# Patient Record
Sex: Female | Born: 1975
Health system: Southern US, Community
[De-identification: ages and names within clinical notes are randomized; demographics above are authoritative.]

## PROBLEM LIST (undated history)

## (undated) DIAGNOSIS — R112 Nausea with vomiting, unspecified: Secondary | ICD-10-CM

## (undated) DIAGNOSIS — R0600 Dyspnea, unspecified: Secondary | ICD-10-CM

## (undated) DIAGNOSIS — S060XAA Concussion with loss of consciousness status unknown, initial encounter: Secondary | ICD-10-CM

## (undated) DIAGNOSIS — F419 Anxiety disorder, unspecified: Secondary | ICD-10-CM

## (undated) DIAGNOSIS — Z9889 Other specified postprocedural states: Secondary | ICD-10-CM

## (undated) DIAGNOSIS — G43909 Migraine, unspecified, not intractable, without status migrainosus: Secondary | ICD-10-CM

## (undated) DIAGNOSIS — J302 Other seasonal allergic rhinitis: Secondary | ICD-10-CM

## (undated) HISTORY — DX: Migraine, unspecified, not intractable, without status migrainosus: G43.909

## (undated) HISTORY — PX: EYELID LACERATION REPAIR: SHX5333

## (undated) HISTORY — DX: Nausea with vomiting, unspecified: R11.2

## (undated) HISTORY — PX: EYE SURGERY: SHX253

## (undated) HISTORY — DX: Concussion with loss of consciousness status unknown, initial encounter: S06.0XAA

## (undated) HISTORY — DX: Other seasonal allergic rhinitis: J30.2

---

## 1999-06-01 ENCOUNTER — Other Ambulatory Visit: Admission: RE | Admit: 1999-06-01 | Discharge: 1999-06-01 | Payer: Self-pay | Admitting: Obstetrics and Gynecology

## 2001-07-26 ENCOUNTER — Emergency Department (HOSPITAL_COMMUNITY): Admission: EM | Admit: 2001-07-26 | Discharge: 2001-07-26 | Payer: Self-pay | Admitting: Emergency Medicine

## 2001-07-26 ENCOUNTER — Encounter: Payer: Self-pay | Admitting: Emergency Medicine

## 2001-07-30 ENCOUNTER — Emergency Department (HOSPITAL_COMMUNITY): Admission: EM | Admit: 2001-07-30 | Discharge: 2001-07-30 | Payer: Self-pay | Admitting: Emergency Medicine

## 2001-07-30 ENCOUNTER — Encounter: Payer: Self-pay | Admitting: Emergency Medicine

## 2002-02-27 ENCOUNTER — Ambulatory Visit (HOSPITAL_COMMUNITY): Admission: RE | Admit: 2002-02-27 | Discharge: 2002-02-27 | Payer: Self-pay | Admitting: Chiropractic Medicine

## 2002-02-27 ENCOUNTER — Encounter: Payer: Self-pay | Admitting: Chiropractic Medicine

## 2019-03-25 ENCOUNTER — Emergency Department (HOSPITAL_COMMUNITY)
Admission: EM | Admit: 2019-03-25 | Discharge: 2019-03-25 | Disposition: A | Discharge status: Home / Self Care | Payer: No Typology Code available for payment source | Attending: Emergency Medicine | Admitting: Emergency Medicine

## 2019-03-25 ENCOUNTER — Emergency Department (HOSPITAL_COMMUNITY): Payer: No Typology Code available for payment source

## 2019-03-25 ENCOUNTER — Emergency Department (HOSPITAL_COMMUNITY): Admission: EM | Admit: 2019-03-25 | Discharge: 2019-03-25 | Discharge status: Absent without leave | Payer: Self-pay

## 2019-03-25 ENCOUNTER — Encounter (HOSPITAL_COMMUNITY): Payer: Self-pay | Admitting: Emergency Medicine

## 2019-03-25 ENCOUNTER — Ambulatory Visit (HOSPITAL_COMMUNITY)
Admission: EM | Admit: 2019-03-25 | Discharge: 2019-03-25 | Disposition: A | Discharge status: Other Acute Inpatient Hospital | Payer: Self-pay

## 2019-03-25 ENCOUNTER — Encounter (HOSPITAL_COMMUNITY): Payer: Self-pay

## 2019-03-25 ENCOUNTER — Ambulatory Visit (HOSPITAL_COMMUNITY)
Admission: EM | Admit: 2019-03-25 | Discharge: 2019-03-25 | Disposition: A | Discharge status: Home / Self Care | Payer: Self-pay

## 2019-03-25 ENCOUNTER — Other Ambulatory Visit: Payer: Self-pay

## 2019-03-25 DIAGNOSIS — Y998 Other external cause status: Secondary | ICD-10-CM | POA: Insufficient documentation

## 2019-03-25 DIAGNOSIS — Y93I9 Activity, other involving external motion: Secondary | ICD-10-CM | POA: Insufficient documentation

## 2019-03-25 DIAGNOSIS — M7918 Myalgia, other site: Secondary | ICD-10-CM

## 2019-03-25 DIAGNOSIS — S299XXA Unspecified injury of thorax, initial encounter: Secondary | ICD-10-CM | POA: Diagnosis not present

## 2019-03-25 DIAGNOSIS — S199XXA Unspecified injury of neck, initial encounter: Secondary | ICD-10-CM | POA: Diagnosis present

## 2019-03-25 DIAGNOSIS — S161XXA Strain of muscle, fascia and tendon at neck level, initial encounter: Secondary | ICD-10-CM | POA: Insufficient documentation

## 2019-03-25 DIAGNOSIS — R51 Headache: Secondary | ICD-10-CM | POA: Insufficient documentation

## 2019-03-25 DIAGNOSIS — Y92410 Unspecified street and highway as the place of occurrence of the external cause: Secondary | ICD-10-CM | POA: Insufficient documentation

## 2019-03-25 DIAGNOSIS — F17210 Nicotine dependence, cigarettes, uncomplicated: Secondary | ICD-10-CM | POA: Insufficient documentation

## 2019-03-25 MED ORDER — ONDANSETRON 4 MG PO TBDP
4.0000 mg | ORAL_TABLET | Freq: Once | ORAL | Status: AC
  Administered 2019-03-25: 4 mg via ORAL
  Filled 2019-03-25: qty 1

## 2019-03-25 MED ORDER — METHOCARBAMOL 500 MG PO TABS: 500.0000 mg | ORAL_TABLET | Freq: Two times a day (BID) | ORAL | 0 refills | Status: DC

## 2019-03-25 MED ORDER — ONDANSETRON 4 MG PO TBDP: 4.0000 mg | ORAL_TABLET | Freq: Three times a day (TID) | ORAL | 0 refills | Status: DC | PRN

## 2019-03-25 MED ORDER — HYDROCODONE-ACETAMINOPHEN 5-325 MG PO TABS
2.0000 | ORAL_TABLET | Freq: Once | ORAL | Status: AC
  Administered 2019-03-25: 2 via ORAL
  Filled 2019-03-25: qty 2

## 2019-03-25 NOTE — Discharge Instructions (Signed)

## 2019-03-25 NOTE — ED Notes (Signed)
Patient is being discharged from the [facility] and sent to the Emergency Department via wheelchair by staff. Per KT, patient is stable but in need of higher level of care due to eval of possible head injury. Patient is aware and verbalizes understanding of plan of care.  Vitals:   03/25/19 1904  BP: 116/90  Pulse: (!) 56  Resp: 19  Temp: 98.5 F (36.9 C)  SpO2: 100%
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
## 2019-03-25 NOTE — ED Notes (Signed)
Discharge instructions discussed with pt. Pt verbalized understanding no questions at this time. Pt to go home with husband

## 2019-03-25 NOTE — ED Notes (Signed)
Patient transported to CT 
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
## 2019-03-29 ENCOUNTER — Ambulatory Visit (HOSPITAL_COMMUNITY)
Admission: EM | Admit: 2019-03-29 | Discharge: 2019-03-29 | Disposition: A | Discharge status: Home / Self Care | Payer: Self-pay | Attending: Emergency Medicine | Admitting: Emergency Medicine

## 2019-03-29 ENCOUNTER — Ambulatory Visit (INDEPENDENT_AMBULATORY_CARE_PROVIDER_SITE_OTHER): Payer: Self-pay

## 2019-03-29 ENCOUNTER — Encounter (HOSPITAL_COMMUNITY): Payer: Self-pay

## 2019-03-29 ENCOUNTER — Other Ambulatory Visit: Payer: Self-pay

## 2019-03-29 DIAGNOSIS — M7918 Myalgia, other site: Secondary | ICD-10-CM
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
## 2019-03-29 NOTE — Discharge Instructions (Addendum)
Take ibuprofen as needed for your discomfort.    The x-ray of your foot was negative.    Go to the emergency department if you develop acute worsening pain, dizziness, chest pain, shortness of breath, palpitations, abdominal pain, blood in your stool, or other concerning symptoms.

## 2019-04-12 ENCOUNTER — Emergency Department (HOSPITAL_COMMUNITY): Payer: No Typology Code available for payment source

## 2019-04-12 ENCOUNTER — Emergency Department (HOSPITAL_COMMUNITY)
Admission: EM | Admit: 2019-04-12 | Discharge: 2019-04-12 | Disposition: A | Discharge status: Home / Self Care | Payer: No Typology Code available for payment source | Attending: Emergency Medicine | Admitting: Emergency Medicine

## 2019-04-12 DIAGNOSIS — R29810 Facial weakness: Secondary | ICD-10-CM | POA: Diagnosis present

## 2019-04-12 DIAGNOSIS — M542 Cervicalgia: Secondary | ICD-10-CM | POA: Diagnosis not present

## 2019-04-12 DIAGNOSIS — B373 Candidiasis of vulva and vagina: Secondary | ICD-10-CM | POA: Diagnosis not present

## 2019-04-12 DIAGNOSIS — N898 Other specified noninflammatory disorders of vagina: Secondary | ICD-10-CM | POA: Diagnosis not present

## 2019-04-12 DIAGNOSIS — F17290 Nicotine dependence, other tobacco product, uncomplicated: Secondary | ICD-10-CM | POA: Diagnosis not present

## 2019-04-12 DIAGNOSIS — B3731 Acute candidiasis of vulva and vagina: Secondary | ICD-10-CM

## 2019-04-12 DIAGNOSIS — Z79899 Other long term (current) drug therapy: Secondary | ICD-10-CM | POA: Diagnosis not present

## 2019-04-12 LAB — I-STAT CHEM 8, ED
BUN: 12 mg/dL (ref 6–20)
Calcium, Ion: 1.23 mmol/L (ref 1.15–1.40)
Chloride: 105 mmol/L (ref 98–111)
Creatinine, Ser: 0.7 mg/dL (ref 0.44–1.00)
Glucose, Bld: 85 mg/dL (ref 70–99)
HCT: 43 % (ref 36.0–46.0)
Hemoglobin: 14.6 g/dL (ref 12.0–15.0)
Potassium: 3.5 mmol/L (ref 3.5–5.1)
Sodium: 142 mmol/L (ref 135–145)
TCO2: 23 mmol/L (ref 22–32)

## 2019-04-12 LAB — I-STAT BETA HCG BLOOD, ED (MC, WL, AP ONLY): I-stat hCG, quantitative: <5 m[IU]/mL (ref <5)

## 2019-04-12 LAB — URINALYSIS, ROUTINE W REFLEX MICROSCOPIC
Bilirubin Urine: NEGATIVE
Glucose, UA: NEGATIVE mg/dL
Ketones, ur: NEGATIVE mg/dL
Nitrite: NEGATIVE
Protein, ur: NEGATIVE mg/dL
Specific Gravity, Urine: 1.038 — ABNORMAL HIGH (ref 1.005–1.030)
pH: 5 (ref 5.0–8.0)

## 2019-04-12 LAB — CBG MONITORING, ED: Glucose-Capillary: 95 mg/dL (ref 70–99)

## 2019-04-12 MED ORDER — IOHEXOL 350 MG/ML SOLN
100.0000 mL | Freq: Once | INTRAVENOUS | Status: AC | PRN
  Administered 2019-04-12: 100 mL via INTRAVENOUS

## 2019-04-12 MED ORDER — ONDANSETRON 4 MG PO TBDP: 4.0000 mg | ORAL_TABLET | Freq: Three times a day (TID) | ORAL | 0 refills | Status: DC | PRN

## 2019-04-12 MED ORDER — FLUCONAZOLE 200 MG PO TABS: 200.0000 mg | ORAL_TABLET | Freq: Once | ORAL | 0 refills | Status: AC

## 2019-04-12 MED ORDER — METHOCARBAMOL 500 MG PO TABS: 500.0000 mg | ORAL_TABLET | Freq: Two times a day (BID) | ORAL | 0 refills | Status: DC

## 2019-04-12 MED ORDER — PREDNISONE 10 MG PO TABS: 10.0000 mg | ORAL_TABLET | Freq: Every day | ORAL | 0 refills | Status: DC

## 2019-04-12 MED ORDER — PREDNISONE 10 MG PO TABS: 40.0000 mg | ORAL_TABLET | Freq: Every day | ORAL | 0 refills | Status: AC

## 2019-04-12 MED ORDER — PREDNISONE 10 MG PO TABS: 40.0000 mg | ORAL_TABLET | Freq: Every day | ORAL | 0 refills | Status: DC
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
## 2019-04-12 NOTE — ED Notes (Signed)
Patient transported to CT 
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
## 2019-04-12 NOTE — Discharge Instructions (Addendum)
Take ibuprofen as needed for pain as discussed  Follow up with neurology if needed Return if symptoms worsen  Take fluconazole as directed

## 2019-06-04 ENCOUNTER — Telehealth: Payer: Self-pay | Admitting: Neurology

## 2019-06-04 ENCOUNTER — Encounter: Payer: Self-pay | Admitting: Neurology

## 2019-06-04 ENCOUNTER — Ambulatory Visit: Payer: No Typology Code available for payment source | Admitting: Neurology

## 2019-06-04 ENCOUNTER — Other Ambulatory Visit: Payer: Self-pay

## 2019-06-04 VITALS — BP 118/80 | HR 100 | Temp 97.7°F | Ht 66.0 in | Wt 150.0 lb

## 2019-06-04 DIAGNOSIS — F0781 Postconcussional syndrome: Secondary | ICD-10-CM

## 2019-06-04 DIAGNOSIS — M545 Low back pain, unspecified: Secondary | ICD-10-CM

## 2019-06-04 DIAGNOSIS — IMO0002 Reserved for concepts with insufficient information to code with codable children: Secondary | ICD-10-CM

## 2019-06-04 DIAGNOSIS — S0990XA Unspecified injury of head, initial encounter: Secondary | ICD-10-CM | POA: Insufficient documentation

## 2019-06-04 DIAGNOSIS — G43709 Chronic migraine without aura, not intractable, without status migrainosus: Secondary | ICD-10-CM

## 2019-06-04 DIAGNOSIS — M549 Dorsalgia, unspecified: Secondary | ICD-10-CM | POA: Insufficient documentation

## 2019-06-04 MED ORDER — GABAPENTIN 300 MG PO CAPS: 300.0000 mg | ORAL_CAPSULE | Freq: Three times a day (TID) | ORAL | 11 refills | Status: DC

## 2019-06-04 MED ORDER — RIZATRIPTAN BENZOATE 10 MG PO TABS: 10.0000 mg | ORAL_TABLET | ORAL | 3 refills | Status: DC | PRN

## 2019-06-04 NOTE — Telephone Encounter (Signed)
self pay order sent to GI. They will reach out to the patient to schedule.  °
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
## 2019-06-05 ENCOUNTER — Telehealth: Payer: Self-pay | Admitting: Neurology

## 2019-06-05 NOTE — Telephone Encounter (Signed)
In response to a voicemail that pt left on office voicemail @ 12:16.  Pt has asked for a call to confirm if [id number], if so pt is asking how many, she would like a call to confirm the above mentioned pharmacy is where the script was called into.

## 2019-06-05 NOTE — Telephone Encounter (Signed)
Called pt. [facility] but states they did not have any on file. I called and verified they has rx's but showing under "Lindsey Graham". Pt needs to bring updated insurance cards with her and they will update her info. I called pt and relayed this. She verbalized understanding.

## 2019-06-10 ENCOUNTER — Telehealth: Payer: Self-pay | Admitting: Neurology

## 2019-06-10 NOTE — Telephone Encounter (Signed)
Called, LVM returning pt call 

## 2019-06-10 NOTE — Telephone Encounter (Signed)
Pt called in and stated the Emgality sample helped her, she also states she is dealing with anxiety and she wants to know what she can do about it.

## 2019-06-11 NOTE — Telephone Encounter (Signed)
Called, LVM for pt to call office. 

## 2019-06-12 NOTE — Telephone Encounter (Signed)
I spoke to Dr. Felecia Shelling concerning this patient.   Per vo by Dr. Felecia Shelling, he will offer the following prescriptions:  1) For migraines:  Emgality 120mg , one injection monthly, 90-day x 3 okay.  2) For anxiety:  Sertraline 50mg , one tablet daily, 90-day x 3 okay.  I attempted to reach the patient again.  Left another message requesting she call our office back.  Provided our hours of operation and phone number.

## 2019-06-16 NOTE — Telephone Encounter (Signed)
Pt returning call please call back °

## 2019-06-16 NOTE — Telephone Encounter (Signed)
Called, LVM for pt returning call.

## 2019-06-24 ENCOUNTER — Telehealth: Payer: Self-pay | Admitting: Neurology

## 2019-06-24 MED ORDER — SERTRALINE HCL 50 MG PO TABS: 50.0000 mg | ORAL_TABLET | Freq: Every day | ORAL | 3 refills | Status: DC

## 2019-06-24 NOTE — Telephone Encounter (Deleted)
Pt has called to discuss [name].  Pt is asking if a prescription will be called in for it and when she will need to take it again.  Please call

## 2019-06-24 NOTE — Telephone Encounter (Signed)
Pt has called to discuss [name].  Pt is asking if a prescription will be called in for it and when she will need to take it again.  Please call °

## 2019-06-24 NOTE — Telephone Encounter (Addendum)
Called pt back. Relayed Dr. Garth Bigness recommendations. She does not have insurance right now. I explained she can contact [employer] to see if she would be eligible to receive emgality from them. I provided phone number: [phone]. Also sent her link to website to start application online via mychart. Pt will let me know once she completes this so we can then send in prescriber portion.  [facility] She will try and use goodrx coupon for this.

## 2019-06-24 NOTE — Addendum Note (Signed)
Addended by: Hope Pigeon on: 06/24/2019 09:51 AM   Modules accepted: Orders

## 2019-07-01 ENCOUNTER — Other Ambulatory Visit: Payer: Self-pay

## 2019-07-01 ENCOUNTER — Ambulatory Visit
Admission: RE | Admit: 2019-07-01 | Discharge: 2019-07-01 | Disposition: A | Discharge status: Home / Self Care | Payer: Self-pay | Source: Ambulatory Visit | Attending: Neurology | Admitting: Neurology

## 2019-07-01 DIAGNOSIS — S0990XA Unspecified injury of head, initial encounter: Secondary | ICD-10-CM

## 2019-07-03 ENCOUNTER — Telehealth: Payer: Self-pay

## 2019-07-03 ENCOUNTER — Telehealth: Payer: Self-pay | Admitting: Neurology

## 2019-07-03 MED ORDER — EMGALITY 120 MG/ML ~~LOC~~ SOSY: 120.0000 mg | PREFILLED_SYRINGE | Freq: Every day | SUBCUTANEOUS | 5 refills | Status: DC

## 2019-07-03 NOTE — Telephone Encounter (Signed)
-----   Message from Britt Bottom, MD sent at 07/02/2019 12:28 PM EST ----- Please let the patient know that the MRi was normal

## 2019-07-03 NOTE — Telephone Encounter (Signed)
Pt did not qualify for pt assistance. Pt requests rx [facility] [place] and she will pay out of pocket.

## 2019-07-03 NOTE — Telephone Encounter (Signed)
I contacted the pt and lvm advising of normal results ( ok per dpr). Pt was advised to call back if she had any further questions.

## 2019-07-03 NOTE — Telephone Encounter (Signed)
1) Medication(s) Requested (by name): emigility 2) Pharmacy [facility] [address] [facility] [place] 3) Special Requests: Patient called stating she would need a refill for emigility and was not eligible for it. Please follow up.

## 2019-07-16 ENCOUNTER — Encounter (HOSPITAL_COMMUNITY): Payer: Self-pay

## 2019-07-16 ENCOUNTER — Ambulatory Visit (HOSPITAL_COMMUNITY)
Admission: EM | Admit: 2019-07-16 | Discharge: 2019-07-16 | Disposition: A | Discharge status: Home / Self Care | Payer: Self-pay | Attending: Family Medicine | Admitting: Family Medicine

## 2019-07-16 ENCOUNTER — Ambulatory Visit: Payer: Self-pay | Admitting: Family Medicine

## 2019-07-16 ENCOUNTER — Other Ambulatory Visit: Payer: Self-pay

## 2019-07-16 ENCOUNTER — Encounter: Payer: Self-pay | Admitting: Family Medicine

## 2019-07-16 VITALS — BP 116/80 | HR 92 | Temp 97.8°F | Ht 66.0 in | Wt 157.4 lb

## 2019-07-16 DIAGNOSIS — F0781 Postconcussional syndrome: Secondary | ICD-10-CM

## 2019-07-16 DIAGNOSIS — F329 Major depressive disorder, single episode, unspecified: Secondary | ICD-10-CM

## 2019-07-16 DIAGNOSIS — IMO0002 Reserved for concepts with insufficient information to code with codable children: Secondary | ICD-10-CM

## 2019-07-16 DIAGNOSIS — Z7689 Persons encountering health services in other specified circumstances: Secondary | ICD-10-CM

## 2019-07-16 DIAGNOSIS — G8929 Other chronic pain: Secondary | ICD-10-CM

## 2019-07-16 DIAGNOSIS — G43709 Chronic migraine without aura, not intractable, without status migrainosus: Secondary | ICD-10-CM

## 2019-07-16 DIAGNOSIS — R319 Hematuria, unspecified: Secondary | ICD-10-CM | POA: Insufficient documentation

## 2019-07-16 DIAGNOSIS — F419 Anxiety disorder, unspecified: Secondary | ICD-10-CM

## 2019-07-16 LAB — POCT URINALYSIS DIP (DEVICE)
Bilirubin Urine: NEGATIVE
Glucose, UA: NEGATIVE mg/dL
Ketones, ur: NEGATIVE mg/dL
Nitrite: NEGATIVE
Protein, ur: NEGATIVE mg/dL
Specific Gravity, Urine: 1.025 (ref 1.005–1.030)
Urobilinogen, UA: 0.2 mg/dL (ref 0.0–1.0)
pH: 7 (ref 5.0–8.0)

## 2019-07-16 MED ORDER — FLUCONAZOLE 150 MG PO TABS: 150.0000 mg | ORAL_TABLET | Freq: Every day | ORAL | 0 refills | Status: DC

## 2019-07-16 MED ORDER — NITROFURANTOIN MONOHYD MACRO 100 MG PO CAPS: 100.0000 mg | ORAL_CAPSULE | Freq: Two times a day (BID) | ORAL | 0 refills | Status: DC

## 2019-07-16 NOTE — Progress Notes (Signed)
I have read the note, and I agree with the clinical assessment and plan.  Emily Forse A. Rizwan Kuyper, MD, PhD, FAAN Certified in Neurology, Clinical Neurophysiology, Sleep Medicine, Pain Medicine and Neuroimaging  [facility] [address], [address] , [place] [address] [phone]  
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
## 2019-07-16 NOTE — ED Triage Notes (Signed)
Pt states she has blood in her urine. Pt states this has been going on for a week now.

## 2019-07-16 NOTE — Patient Instructions (Addendum)
We will continue Emgality, gabapentin and Zoloft as prescribed.   You can use rizatriptan as needed for abortive therapy of migraines  Referral for PCP placed   Follow up in 6 months, sooner if needed    Post-Concussion Syndrome  Post-concussion syndrome is when symptoms last longer than normal after a head injury. What are the signs or symptoms? After a head injury, you may:  Have headaches.  Feel tired.  Feel dizzy.  Feel weak.  Have trouble seeing.  Have trouble in bright lights.  Have trouble hearing.  Not be able to remember things.  Not be able to focus.  Have trouble sleeping.  Have mood swings.  Have trouble learning new things. These can last from weeks to months. Follow these instructions at home: Medicines  Take all medicines only as told by your doctor.  Do not take prescription pain medicines. Activity  Limit activities as told by your doctor. This includes: ? Homework. ? Job-related work. ? Thinking. ? Watching TV. ? Using a computer or phone. ? Puzzles. ? Exercise. ? Sports.  Slowly return to your normal activity as told by your doctor.  Stop an activity if you have symptoms.  Do not do anything that may cause you to get injured again. General instructions  Rest. Try to: ? Sleep 7-9 hours each night. ? Take naps or breaks when you feel tired during the day.  Do not drink alcohol until your doctor says that you can.  Keep track of your symptoms.  Keep all follow-up visits as told by your doctor. This is important. Contact a doctor if:  You do not improve.  You get worse.  You have another injury. Get help right away if:  You have a very bad headache.  You feel confused.  You feel very sleepy.  You pass out (faint).  You throw up (vomit).  You feel weak in any part of your body.  You feel numb in any part of your body.  You start shaking (have a seizure).  You have trouble  talking. Summary  Post-concussion syndrome is when symptoms last longer than normal after a head injury.  Limit all activity after your injury. Gradually return to normal activity as told by your doctor.  Rest, do not drink alcohol, and avoid prescription pain medicines after a concussion.  Call your doctor if your symptoms get worse. This information is not intended to replace advice given to you by your health care provider. Make sure you discuss any questions you have with your health care provider. Document Released: 09/07/2004 Document Revised: 05/23/2018 Document Reviewed: 09/04/2017 Elsevier Patient Education  2020 [employer].     Memory Compensation Strategies  1. Use "WARM" strategy.  W= write it down  A= associate it  R= repeat it  M= make a mental note  2.   You can keep a Social worker.  Use a 3-ring notebook with sections for the following: calendar, important names and phone numbers,  medications, doctors' names/phone numbers, lists/reminders, and a section to journal what you did  each day.   3.    Use a calendar to write appointments down.  4.    Write yourself a schedule for the day.  This can be placed on the calendar or in a separate section of the Memory Notebook.  Keeping a  regular schedule can help memory.  5.    Use medication organizer with sections for each day or morning/evening pills.  You may need help  loading it  6.    Keep a basket, or pegboard by the door.  Place items that you need to take out with you in the basket or on the pegboard.  You may also want to  include a message board for reminders.  7.    Use sticky notes.  Place sticky notes with reminders in a place where the task is performed.  For example: " turn off the  stove" placed by the stove, "lock the door" placed on the door at eye level, " take your medications" on  the bathroom mirror or by the place where you normally take your medications.  8.    Use alarms/timers.  Use  while cooking to remind yourself to check on food or as a reminder to take your medicine, or as a  reminder to make a call, or as a reminder to perform another task, etc.

## 2019-07-16 NOTE — Discharge Instructions (Signed)
Treating you for urinary tract infection.  We will send her urine for culture Take the medication as prescribed and make sure you are drinking plenty of fluids. Follow up as needed for continued or worsening symptoms

## 2019-07-16 NOTE — ED Provider Notes (Signed)
[facility]    CSN: [id number] Arrival date & time: 07/16/19  1109      History   Chief Complaint Chief Complaint  Patient presents with  . Hematuria    HPI Lyanne Coonya Verdone is a 43 y.o. female.   Patient is a 43 year old female with past medical history of head injury, postconcussion syndrome, chronic migraine, back pain.  She presents today with urinary symptoms.  She is having some hematuria and dysuria.  This is been going on for proxy 1 week.  History of same with urinary tract infections.  She had some mild lower back pain but denies any abdominal pain, flank pain, nausea, vomiting or fevers.  Denies any vaginal discharge, itching or irritation.  ROS per HPI      History reviewed. No pertinent past medical history.  Patient Active Problem List   Diagnosis Date Noted  . Head injury 06/04/2019  . Post concussion syndrome 06/04/2019  . Chronic migraine 06/04/2019  . Back pain 06/04/2019    History reviewed. No pertinent surgical history.  OB History   No obstetric history on file.      Home Medications    Prior to Admission medications   Medication Sig Start Date End Date Taking? Authorizing Provider  acetaminophen (TYLENOL) 500 MG tablet Take 1,000 mg by mouth every 6 (six) hours as needed for headache (pain).    [provider]  fluconazole (DIFLUCAN) 150 MG tablet Take 1 tablet (150 mg total) by mouth daily. 07/16/19   Dahlia ByesBast, Safire Gordin A, NP  gabapentin (NEURONTIN) 300 MG capsule Take 1 capsule (300 mg total) by mouth 3 (three) times daily. 06/04/19   Sater, Pearletha Furlichard A, MD  Galcanezumab-gnlm (EMGALITY) 120 MG/ML SOSY Inject 120 mg into the skin daily. 07/03/19   Sater, Pearletha Furlichard A, MD  nitrofurantoin, macrocrystal-monohydrate, (MACROBID) 100 MG capsule Take 1 capsule (100 mg total) by mouth 2 (two) times daily. 07/16/19   Annabell Oconnor, Gloris Manchesterraci A, NP  ondansetron (ZOFRAN) 8 MG tablet Take 8 mg by mouth as needed. 05/28/19   [provider]  rizatriptan  (MAXALT) 10 MG tablet Take 1 tablet (10 mg total) by mouth as needed. Patient not taking: Reported on 07/16/2019 06/04/19   Asa LenteSater, Richard A, MD  sertraline (ZOLOFT) 50 MG tablet Take 1 tablet (50 mg total) by mouth daily. 06/24/19   Sater, Pearletha Furlichard A, MD  tiZANidine (ZANAFLEX) 4 MG tablet Take 4-8 mg by mouth at bedtime. 05/21/19   [provider]    Family History Family History  Problem Relation Age of Onset  . COPD Father     Social History Social History   Tobacco Use  . Smoking status: Former Smoker    Types: E-cigarettes  . Smokeless tobacco: Current User  Substance Use Topics  . Alcohol use: Never    Frequency: Never  . Drug use: Never     Allergies   Patient has no known allergies.   Review of Systems Review of Systems   Physical Exam Triage Vital Signs ED Triage Vitals  Enc Vitals Group     BP 07/16/19 1223 116/84     Pulse Rate 07/16/19 1223 79     Resp 07/16/19 1223 18     Temp 07/16/19 1223 98.4 F (36.9 C)     Temp Source 07/16/19 1223 Oral     SpO2 07/16/19 1223 100 %     Weight --      Height --      Head Circumference --  Peak Flow --      Pain Score 07/16/19 1220 6     Pain Loc --      Pain Edu? --      Excl. in [place]? --    No data found.  Updated Vital Signs BP 116/84 (BP Location: Right Arm)   Pulse 79   Temp 98.4 F (36.9 C) (Oral)   Resp 18   SpO2 100%   Visual Acuity Right Eye Distance:   Left Eye Distance:   Bilateral Distance:    Right Eye Near:   Left Eye Near:    Bilateral Near:     Physical Exam Vitals signs and nursing note reviewed.  Constitutional:      General: She is not in acute distress.    Appearance: Normal appearance. She is not ill-appearing, toxic-appearing or diaphoretic.  HENT:     Head: Normocephalic.     Nose: Nose normal.     Mouth/Throat:     Pharynx: Oropharynx is clear.  Eyes:     Conjunctiva/sclera: Conjunctivae normal.  Neck:     Musculoskeletal: Normal range of motion.   Pulmonary:     Effort: Pulmonary effort is normal.  Abdominal:     Palpations: Abdomen is soft.     Tenderness: There is no abdominal tenderness.  Musculoskeletal: Normal range of motion.  Skin:    General: Skin is warm and dry.     Findings: No rash.  Neurological:     Mental Status: She is alert.  Psychiatric:        Mood and Affect: Mood normal.      UC Treatments / Results  Labs (all labs ordered are listed, but only abnormal results are displayed) Labs Reviewed  POCT URINALYSIS DIP (DEVICE) - Abnormal; Notable for the following components:      Result Value   Hgb urine dipstick SMALL (*)    Leukocytes,Ua SMALL (*)    All other components within normal limits  URINE CULTURE    EKG   Radiology No results found.  Procedures Procedures (including critical care time)  Medications Ordered in UC Medications - No data to display  Initial Impression / Assessment and Plan / UC Course  I have reviewed the triage vital signs and the nursing notes.  Pertinent labs & imaging results that were available during my care of the patient were reviewed by me and considered in my medical decision making (see chart for details).     Hematuria-urine with small leuks and small blood.  Will send for culture. Will treat with Macrobid pending culture based on symptoms and urine stable Recommended push fluids Final Clinical Impressions(s) / UC Diagnoses   Final diagnoses:  Hematuria, unspecified type     Discharge Instructions     Treating you for urinary tract infection.  We will send her urine for culture Take the medication as prescribed and make sure you are drinking plenty of fluids. Follow up as needed for continued or worsening symptoms     ED Prescriptions    Medication Sig Dispense Auth. Provider   nitrofurantoin, macrocrystal-monohydrate, (MACROBID) 100 MG capsule  (Status: Discontinued) Take 1 capsule (100 mg total) by mouth 2 (two) times daily. 10 capsule  Owen Pratte A, NP   fluconazole (DIFLUCAN) 150 MG tablet  (Status: Discontinued) Take 1 tablet (150 mg total) by mouth daily. 2 tablet Clyda Smyth A, NP   fluconazole (DIFLUCAN) 150 MG tablet Take 1 tablet (150 mg total) by mouth daily. 2 tablet  Draken Farrior A, NP   nitrofurantoin, macrocrystal-monohydrate, (MACROBID) 100 MG capsule Take 1 capsule (100 mg total) by mouth 2 (two) times daily. 10 capsule Dahlia Byes A, NP     PDMP not reviewed this encounter.   Janace Aris, NP 07/16/19 1319

## 2019-07-18 LAB — URINE CULTURE: Culture: >=100000 — AB

## 2019-07-24 ENCOUNTER — Telehealth: Payer: Self-pay | Admitting: Neurology

## 2019-07-24 NOTE — Telephone Encounter (Signed)
Pt called in and stated she has found a PCP but she states she would need a referral.  Dr Eunice Blase [address], [place], [place] [address] Phone : [phone]

## 2019-07-24 NOTE — Telephone Encounter (Signed)
Lindsey Graham/Lindsey Graham- can you f/u with her on this?

## 2019-07-24 NOTE — Telephone Encounter (Signed)
Amy, you saw this pt most recently. Are you ok with this?

## 2019-07-24 NOTE — Telephone Encounter (Signed)
I placed a referral for PCP at last visit. Not sure what other referral she needs.

## 2019-07-28 NOTE — Telephone Encounter (Signed)
I spoke to patient and she is wanting referral to be sent to Dr.HILITS referral has been sent.

## 2019-08-28 ENCOUNTER — Ambulatory Visit: Payer: Self-pay | Admitting: Family Medicine

## 2019-08-29 ENCOUNTER — Telehealth: Payer: Self-pay | Admitting: Family Medicine

## 2019-08-29 NOTE — Telephone Encounter (Signed)
I called and advised the patient. She will call around to the urgent care centers &/or ERs to see if any of them treat covid + patients virtually or get advice on what she should do/where to go to be evaluated for the chest pain she is having. The patient was advised to reschedule the "to establish PCP" visit after at least a couple of weeks, to make sure she is well again before coming here. She voiced understanding.

## 2019-08-29 NOTE — Telephone Encounter (Signed)
I'm afraid I can't advise her until she's established, and we can't see her now that she's covid positive.  If short of breath with chest pain, she should go to the ER (probably [facility]?).

## 2019-08-29 NOTE — Telephone Encounter (Signed)
Pt called in said she had an appt yesterday 08-28-19 that she had to cancel due to getting tested for covid and today she found out she is positive and is now having difficulty breathing and her right side of her chest is hurting. Pt said she doesn't have a PCP and was trying to establish with dr.hilts but was wanting to know if he can advise her on what to do.   [phone]

## 2019-08-29 NOTE — Telephone Encounter (Signed)
Please advise. She has not been seen here and I do not know what I should tell her exactly.

## 2019-09-01 ENCOUNTER — Ambulatory Visit (HOSPITAL_COMMUNITY)
Admission: EM | Admit: 2019-09-01 | Discharge: 2019-09-01 | Disposition: A | Discharge status: Home / Self Care | Payer: Self-pay | Attending: Family Medicine | Admitting: Family Medicine

## 2019-09-01 ENCOUNTER — Telehealth: Payer: Self-pay

## 2019-09-01 ENCOUNTER — Ambulatory Visit (INDEPENDENT_AMBULATORY_CARE_PROVIDER_SITE_OTHER): Payer: Self-pay

## 2019-09-01 ENCOUNTER — Encounter (HOSPITAL_COMMUNITY): Payer: Self-pay

## 2019-09-01 ENCOUNTER — Other Ambulatory Visit: Payer: Self-pay

## 2019-09-01 DIAGNOSIS — U071 COVID-19: Secondary | ICD-10-CM

## 2019-09-01 NOTE — ED Triage Notes (Signed)
Pt tested positive for covid on Friday; she has congestion, rib pain, and shortness of breath that is progressing .

## 2019-09-01 NOTE — ED Provider Notes (Signed)
[facility]    CSN: [id number] Arrival date & time: 09/01/19  1416      History   Chief Complaint Chief Complaint  Patient presents with  . Shortness of Breath  . Congestion  . Rib/Chest Pain    HPI Lindsey [facility] is a 44 y.o. female.   HPI  Patient has no Covid.  She has congestion, cough, shortness of breath, and has developed some right-sided rib and chest pain.  She is "afraid of pneumonia".  She is requesting antibiotics. I explained that with Covid infections cough, runny nose, respiratory symptoms, shortness of breath, chest pain, chest tightness are all quite common.  Unfortunately pneumonia is common as well.  Unless she develops shortness of breath that limits her ability to breathe, or oxygen levels low enough to make her dizzy, she does not need to come back or be hospitalized.  Antibiotics will not help.  Over-the-counter medicines are reviewed with her.  The importance of staying home to prevent spread is reviewed History reviewed. No pertinent past medical history.  Patient Active Problem List   Diagnosis Date Noted  . Head injury 06/04/2019  . Post concussion syndrome 06/04/2019  . Chronic migraine 06/04/2019  . Back pain 06/04/2019    History reviewed. No pertinent surgical history.  OB History   No obstetric history on file.      Home Medications    Prior to Admission medications   Medication Sig Start Date End Date Taking? Authorizing Provider  acetaminophen (TYLENOL) 500 MG tablet Take 1,000 mg by mouth every 6 (six) hours as needed for headache (pain).    [provider]  gabapentin (NEURONTIN) 300 MG capsule Take 1 capsule (300 mg total) by mouth 3 (three) times daily. 06/04/19   Sater, Nanine Means, MD  Galcanezumab-gnlm (EMGALITY) 120 MG/ML SOSY Inject 120 mg into the skin daily. 07/03/19   Sater, Nanine Means, MD  ondansetron (ZOFRAN) 8 MG tablet Take 8 mg by mouth as needed. 05/28/19   [provider]  rizatriptan (MAXALT)  10 MG tablet Take 1 tablet (10 mg total) by mouth as needed. Patient not taking: Reported on 07/16/2019 06/04/19   Britt Bottom, MD  sertraline (ZOLOFT) 50 MG tablet Take 1 tablet (50 mg total) by mouth daily. 06/24/19   Sater, Nanine Means, MD  tiZANidine (ZANAFLEX) 4 MG tablet Take 4-8 mg by mouth at bedtime. 05/21/19   [provider]    Family History Family History  Problem Relation Age of Onset  . COPD Father     Social History Social History   Tobacco Use  . Smoking status: Former Smoker    Types: E-cigarettes  . Smokeless tobacco: Current User  Substance Use Topics  . Alcohol use: Never  . Drug use: Never     Allergies   Patient has no known allergies.   Review of Systems Review of Systems  Constitutional: Positive for fatigue.  Respiratory: Positive for cough, chest tightness and shortness of breath.   Cardiovascular: Positive for chest pain.  Musculoskeletal: Positive for myalgias.     Physical Exam Triage Vital Signs ED Triage Vitals  Enc Vitals Group     BP 09/01/19 1500 121/87     Pulse Rate 09/01/19 1500 90     Resp 09/01/19 1500 16     Temp 09/01/19 1500 98.4 F (36.9 C)     Temp Source 09/01/19 1500 Oral     SpO2 09/01/19 1500 97 %     Weight --  Height --      Head Circumference --      Peak Flow --      Pain Score 09/01/19 1453 7     Pain Loc --      Pain Edu? --      Excl. in [place]? --    No data found.  Updated Vital Signs BP 121/87 (BP Location: Left Arm)   Pulse 90   Temp 98.4 F (36.9 C) (Oral)   Resp 16   LMP  (LMP Unknown)   SpO2 97%      Physical Exam Constitutional:      General: She is not in acute distress.    Appearance: She is well-developed. She is ill-appearing.     Comments: Appears tired  HENT:     Head: Normocephalic and atraumatic.  Eyes:     Conjunctiva/sclera: Conjunctivae normal.     Pupils: Pupils are equal, round, and reactive to light.  Cardiovascular:     Rate and Rhythm: Normal rate  and regular rhythm.     Heart sounds: Normal heart sounds.  Pulmonary:     Effort: Pulmonary effort is normal. No respiratory distress.     Breath sounds: Normal breath sounds. No wheezing, rhonchi or rales.  Abdominal:     General: There is no distension.     Palpations: Abdomen is soft.  Musculoskeletal:        General: Normal range of motion.     Cervical back: Normal range of motion.  Skin:    General: Skin is warm and dry.  Neurological:     Mental Status: She is alert.  Psychiatric:        Mood and Affect: Mood normal.        Behavior: Behavior normal.      UC Treatments / Results  Labs (all labs ordered are listed, but only abnormal results are displayed) Labs Reviewed - No data to display  EKG   Radiology DG Chest 2 View  Result Date: 09/01/2019 CLINICAL DATA:  44 year old female with shortness of breath. Positive COVID-19. EXAM: CHEST - 2 VIEW COMPARISON:  Chest radiograph dated 03/25/2019. FINDINGS: The heart size and mediastinal contours are within normal limits. Both lungs are clear. The visualized skeletal structures are unremarkable. IMPRESSION: No active cardiopulmonary disease. Electronically Signed   By: Elgie Collard M.D.   On: 09/01/2019 15:30    Procedures Procedures (including critical care time)  Medications Ordered in UC Medications - No data to display  Initial Impression / Assessment and Plan / UC Course  I have reviewed the triage vital signs and the nursing notes.  Pertinent labs & imaging results that were available during my care of the patient were reviewed by me and considered in my medical decision making (see chart for details).     Patient is reassured that she does not have pneumonia.  Home care as discussed Final Clinical Impressions(s) / UC Diagnoses   Final diagnoses:  COVID-19     Discharge Instructions     Home to rest CALL for problems Tylenol or ibuprofen for pain or fever  TO ER for significant shortness of  breath, dizziness or weakness   ED Prescriptions    None     PDMP not reviewed this encounter.   Eustace Moore, MD 09/01/19 [id number]

## 2019-09-01 NOTE — Discharge Instructions (Addendum)
Home to rest CALL for problems Tylenol or ibuprofen for pain or fever  TO ER for significant shortness of breath, dizziness or weakness

## 2019-09-03 ENCOUNTER — Telehealth: Payer: Self-pay | Admitting: *Deleted

## 2019-09-03 ENCOUNTER — Encounter (INDEPENDENT_AMBULATORY_CARE_PROVIDER_SITE_OTHER): Payer: Self-pay

## 2019-09-03 ENCOUNTER — Telehealth: Payer: No Typology Code available for payment source | Admitting: Family

## 2019-09-03 DIAGNOSIS — U071 COVID-19: Secondary | ICD-10-CM

## 2019-09-03 MED ORDER — DEXAMETHASONE 6 MG PO TABS: 6.0000 mg | ORAL_TABLET | Freq: Every day | ORAL | 0 refills | Status: DC

## 2019-09-03 MED ORDER — BENZONATATE 100 MG PO CAPS: 100.0000 mg | ORAL_CAPSULE | Freq: Three times a day (TID) | ORAL | 0 refills | Status: DC | PRN

## 2019-09-03 MED ORDER — ALBUTEROL SULFATE HFA 108 (90 BASE) MCG/ACT IN AERS: 2.0000 | INHALATION_SPRAY | Freq: Four times a day (QID) | RESPIRATORY_TRACT | 0 refills | Status: DC | PRN

## 2019-09-03 NOTE — Progress Notes (Signed)
E-Visit for Corona Virus Screening  Your current symptoms could be consistent with the coronavirus.  Many health care providers can now test patients at their office but not all are.  [facility] has multiple testing sites. For information on our COVID testing locations and hours go to [email or web address]  We are enrolling you in our MyChart Home Monitoring for COVID19 . Daily you will receive a questionnaire within the MyChart website. Our COVID 19 response team will be monitoring your responses daily.  Testing Information: The COVID-19 Community Testing sites will begin testing BY APPOINTMENT ONLY.  You can schedule online at [email or web address]  If you do not have access to a smart phone or computer you may call [phone] for an appointment.   Additional testing sites in the Community:  . For [facility] Testing sites in [facility]  [email or web address]  . For Pop-up testing sites in [place]  [email or web address]  . For Testing sites with regular hours [email or web address]  . For [facility] [email or web address]  . For [facility] [email or web address]  . For [facility] testing in [place] and [employer] [email or web address].dk  . For [facility] testing in [facility]   [email or web address]  For  more information about community testing call [phone]   Please quarantine yourself while awaiting your test results. Please stay home for a minimum of 10 days from the first day of illness with improving symptoms and you have had 24 hours of no fever (without the use of Tylenol (Acetaminophen)  Motrin (Ibuprofen) or any fever reducing medication).  Also - Do not get tested prior to returning to work because once you have had a positive test the test can stay positive for more then a month in some cases.   You should wear a mask or cloth face covering over your nose and mouth if you must be around other people or animals, including pets (even at home). Try to stay at least 6 feet away from other people. This will protect the people around you.  Please continue good preventive care measures, including:  frequent hand-washing, avoid touching your face, cover coughs/sneezes, stay out of crowds and keep a 6 foot distance from others.  COVID-19 is a respiratory illness with symptoms that are similar to the flu. Symptoms are typically mild to moderate, but there have been cases of severe illness and death due to the virus.   The following symptoms may appear 2-14 days after exposure: . Fever . Cough . Shortness of breath or difficulty breathing . Chills . Repeated shaking with chills . Muscle pain . Headache . Sore throat . New loss of taste or smell . Fatigue . Congestion or runny nose . Nausea or vomiting . Diarrhea  Go to the nearest hospital ED for assessment if fever/cough/breathlessness are severe or illness seems like a threat to life.  It is vitally important that if you feel that you have an infection such as this virus or any other virus that you stay home and away from places where you may spread it to others.  You should avoid contact with people age 26 and older.   You can use medication such as A prescription cough medication called Tessalon Perles 100 mg. You may take 1-2 capsules every 8 hours as needed for cough and A prescription inhaler called Albuterol MDI 90 mcg /actuation 2 puffs every 4 hours as needed for shortness of breath, wheezing, cough and dexamethasone 6 mg that you will take once daily.   If  your symptoms worsen you need to go to the ED or if they do not  improve in the 24 hours.   You may also take acetaminophen (Tylenol) as needed for fever.  Reduce your risk of any infection by using the same precautions used for avoiding the common cold or flu:  Marland Kitchen Wash your hands often with soap and warm water for at least 20 seconds.  If soap and water are not readily available, use an alcohol-based hand sanitizer with at least 60% alcohol.  . If coughing or sneezing, cover your mouth and nose by coughing or sneezing into the elbow areas of your shirt or coat, into a tissue or into your sleeve (not your hands). . Avoid shaking hands with others and consider head nods or verbal greetings only. . Avoid touching your eyes, nose, or mouth with unwashed hands.  . Avoid close contact with people who are sick. . Avoid places or events with large numbers of people in one location, like concerts or sporting events. . Carefully consider travel plans you have or are making. . If you are planning any travel outside or inside the [place], visit the CDC's Travelers' Health webpage for the latest health notices. . If you have some symptoms but not all symptoms, continue to monitor at home and seek medical attention if your symptoms worsen. . If you are having a medical emergency, call 911.  HOME CARE . Only take medications as instructed by your medical team. . Drink plenty of fluids and get plenty of rest. . A steam or ultrasonic humidifier can help if you have congestion.   GET HELP RIGHT AWAY IF YOU HAVE EMERGENCY WARNING SIGNS** FOR COVID-19. If you or someone is showing any of these signs seek emergency medical care immediately. Call 911 or proceed to your closest emergency facility if: . You develop worsening high fever. . Trouble breathing . Bluish lips or face . Persistent pain or pressure in the chest . New confusion . Inability to wake or stay awake . You cough up blood. . Your symptoms become more severe  **This list is not all possible symptoms. Contact  your medical provider for any symptoms that are sever or concerning to you.  MAKE SURE YOU   Understand these instructions.  Will watch your condition.  Will get help right away if you are not doing well or get worse.  Your e-visit answers were reviewed by a board certified advanced clinical practitioner to complete your personal care plan.  Depending on the condition, your plan could have included both over the counter or prescription medications.  If there is a problem please reply once you have received a response from your provider.  Your safety is important to [place].  If you have drug allergies check your prescription carefully.    You can use MyChart to ask questions about today's visit, request a non-urgent call back, or ask for a work or school excuse for 24 hours related to this e-Visit. If it has been greater than 24 hours you will need to follow up with your provider, or enter a new e-Visit to address those concerns. You will get an e-mail in the next two days asking about your experience.  I hope that your e-visit has been valuable and will speed your recovery. Thank you for using e-visits.

## 2019-09-03 NOTE — Telephone Encounter (Signed)
Pt's COVID questionaire had BPA for increased shortness of breath, Pt states she has asthma and has not had an inhaler in a long time but feels that would help. Pt does not have a PCP, Pt instructed in how to do an e-visit via MyChart for that request. Pt instructed that if symptoms worsen and she feels she needs to seek medical care in person to present to UC or ED. Pt verbalized understanding.

## 2019-09-03 NOTE — Progress Notes (Signed)
Approximately 5 minutes was spent documenting and reviewing patient's chart.   

## 2019-09-04 ENCOUNTER — Encounter (INDEPENDENT_AMBULATORY_CARE_PROVIDER_SITE_OTHER): Payer: Self-pay

## 2019-09-05 ENCOUNTER — Encounter (INDEPENDENT_AMBULATORY_CARE_PROVIDER_SITE_OTHER): Payer: Self-pay

## 2019-09-06 ENCOUNTER — Encounter (INDEPENDENT_AMBULATORY_CARE_PROVIDER_SITE_OTHER): Payer: Self-pay

## 2019-09-07 ENCOUNTER — Telehealth: Payer: Self-pay

## 2019-09-07 ENCOUNTER — Ambulatory Visit (HOSPITAL_COMMUNITY)
Admission: EM | Admit: 2019-09-07 | Discharge: 2019-09-07 | Disposition: A | Discharge status: Home / Self Care | Payer: Self-pay | Attending: Emergency Medicine | Admitting: Emergency Medicine

## 2019-09-07 ENCOUNTER — Encounter (HOSPITAL_COMMUNITY): Payer: Self-pay

## 2019-09-07 ENCOUNTER — Other Ambulatory Visit: Payer: Self-pay

## 2019-09-07 ENCOUNTER — Encounter (INDEPENDENT_AMBULATORY_CARE_PROVIDER_SITE_OTHER): Payer: Self-pay

## 2019-09-07 DIAGNOSIS — U071 COVID-19: Secondary | ICD-10-CM

## 2019-09-07 DIAGNOSIS — R0789 Other chest pain: Secondary | ICD-10-CM

## 2019-09-07 MED ORDER — DOXYCYCLINE HYCLATE 100 MG PO CAPS: 100.0000 mg | ORAL_CAPSULE | Freq: Two times a day (BID) | ORAL | 0 refills | Status: DC

## 2019-09-07 MED ORDER — DEXAMETHASONE SODIUM PHOSPHATE 10 MG/ML IJ SOLN
INTRAMUSCULAR | Status: AC
  Filled 2019-09-07: qty 1

## 2019-09-07 MED ORDER — OMEPRAZOLE 20 MG PO CPDR: 20.0000 mg | DELAYED_RELEASE_CAPSULE | Freq: Two times a day (BID) | ORAL | 0 refills | Status: DC

## 2019-09-07 MED ORDER — FLUCONAZOLE 150 MG PO TABS: 150.0000 mg | ORAL_TABLET | Freq: Once | ORAL | 0 refills | Status: DC

## 2019-09-07 MED ORDER — CETIRIZINE HCL 10 MG PO CAPS: 10.0000 mg | ORAL_CAPSULE | Freq: Every day | ORAL | 0 refills | Status: DC

## 2019-09-07 MED ORDER — BENZONATATE 200 MG PO CAPS: 200.0000 mg | ORAL_CAPSULE | Freq: Three times a day (TID) | ORAL | 0 refills | Status: AC | PRN

## 2019-09-07 MED ORDER — DEXAMETHASONE SODIUM PHOSPHATE 10 MG/ML IJ SOLN
10.0000 mg | Freq: Once | INTRAMUSCULAR | Status: AC
  Administered 2019-09-07: 10 mg via INTRAMUSCULAR

## 2019-09-07 MED ORDER — FLUCONAZOLE 150 MG PO TABS: 150.0000 mg | ORAL_TABLET | Freq: Once | ORAL | 0 refills | Status: AC

## 2019-09-07 MED ORDER — FLUTICASONE PROPIONATE 50 MCG/ACT NA SUSP: 1.0000 | Freq: Every day | NASAL | 0 refills | Status: DC

## 2019-09-07 MED ORDER — DOXYCYCLINE HYCLATE 100 MG PO CAPS: 100.0000 mg | ORAL_CAPSULE | Freq: Two times a day (BID) | ORAL | 0 refills | Status: AC

## 2019-09-07 MED ORDER — BENZONATATE 200 MG PO CAPS: 200.0000 mg | ORAL_CAPSULE | Freq: Three times a day (TID) | ORAL | 0 refills | Status: DC | PRN

## 2019-09-07 NOTE — Telephone Encounter (Signed)
Called pt and LM on VM to call back to discuss new cough.

## 2019-09-07 NOTE — Telephone Encounter (Signed)
Pt called back and cough is productive and is yellow in color, tightness in chest and is wheezing. Coughing creamy yellow. Inhaler 2 hours of relief. Advised pt to go to [facility] to get reevaluated.

## 2019-09-07 NOTE — Telephone Encounter (Signed)
Second attempt to call pt. Will send message with care advice for new onset cough and call back number.  Lindsey Graham, I called twice but unable to leave a message because mailbox is full If cough remains the same or better: continue ti treat with over the counter medications. Hard candy or cough drops and drinking warm fluids will help. You can try 2 teaspoons of honey at bedtime to suppress cough so you can sleep. If cough is becoming worse even with the use of over the counter medications and if you are not able to sleep at night due to cough  or if cough becomes productive with phlegm that may be yellow or green in color, contact your doctor or go to urgent care. If you have any further questions, please call: [phone]. Thank you and I hope you feel better soon.

## 2019-09-07 NOTE — ED Provider Notes (Signed)
[facility]    CSN: [id number] Arrival date & time: 09/07/19  1257      History   Chief Complaint Chief Complaint  Patient presents with  . Shortness of Breath  . Nasal Congestion    HPI Lindsey Graham is a 44 y.o. female history of prior tobacco use, presenting today for evaluation of cough and chest tightness.  Patient tested positive for Covid around 1/15, her symptoms had began approximately 1 week prior.  She has had persistent cough with occasional shortness of breath and wheezing.  The symptoms are intermittent come and go.  Improved with albuterol inhaler, but relief only for 1 to 2 hours.  She was seen previously on Monday for concerns over pneumonia, x-ray clear.  Has also had ED visit where she was prescribed Tessalon, albuterol inhaler and dexamethasone 6 mg daily.  Denies leg pain or leg swelling.  Denies prior DVT/PE.  Denies history of hypertension/diabetes.  HPI  History reviewed. No pertinent past medical history.  Patient Active Problem List   Diagnosis Date Noted  . Head injury 06/04/2019  . Post concussion syndrome 06/04/2019  . Chronic migraine 06/04/2019  . Back pain 06/04/2019    History reviewed. No pertinent surgical history.  OB History   No obstetric history on file.      Home Medications    Prior to Admission medications   Medication Sig Start Date End Date Taking? Authorizing Provider  acetaminophen (TYLENOL) 500 MG tablet Take 1,000 mg by mouth every 6 (six) hours as needed for headache (pain).   Yes [provider]  albuterol (VENTOLIN HFA) 108 (90 Base) MCG/ACT inhaler Inhale 2 puffs into the lungs every 6 (six) hours as needed for wheezing or shortness of breath. 09/03/19  Yes Hawks, Christy A, FNP  dexamethasone (DECADRON) 6 MG tablet Take 1 tablet (6 mg total) by mouth daily. 09/03/19  Yes Hawks, Christy A, FNP  benzonatate (TESSALON) 200 MG capsule Take 1 capsule (200 mg total) by mouth 3 (three) times daily as needed  for up to 7 days for cough. 09/07/19 09/14/19  Danyla Wattley C, PA-C  doxycycline (VIBRAMYCIN) 100 MG capsule Take 1 capsule (100 mg total) by mouth 2 (two) times daily for 10 days. 09/07/19 09/17/19  Khylan Sawyer C, PA-C  fluconazole (DIFLUCAN) 150 MG tablet Take 1 tablet (150 mg total) by mouth once for 1 dose. 09/07/19 09/07/19  Mckenzee Beem C, PA-C  gabapentin (NEURONTIN) 300 MG capsule Take 1 capsule (300 mg total) by mouth 3 (three) times daily. 06/04/19   Sater, Pearletha Furl, MD  Galcanezumab-gnlm (EMGALITY) 120 MG/ML SOSY Inject 120 mg into the skin daily. 07/03/19   Sater, Pearletha Furl, MD  omeprazole (PRILOSEC) 20 MG capsule Take 1 capsule (20 mg total) by mouth 2 (two) times daily before a meal for 14 days. 09/07/19 09/21/19  Rupinder Livingston C, PA-C  ondansetron (ZOFRAN) 8 MG tablet Take 8 mg by mouth as needed. 05/28/19   [provider]  rizatriptan (MAXALT) 10 MG tablet Take 1 tablet (10 mg total) by mouth as needed. Patient not taking: Reported on 07/16/2019 06/04/19   Asa Lente, MD  sertraline (ZOLOFT) 50 MG tablet Take 1 tablet (50 mg total) by mouth daily. 06/24/19   Sater, Pearletha Furl, MD  tiZANidine (ZANAFLEX) 4 MG tablet Take 4-8 mg by mouth at bedtime. 05/21/19   [provider]    Family History Family History  Problem Relation Age of Onset  . COPD Father  Social History Social History   Tobacco Use  . Smoking status: Former Smoker    Types: E-cigarettes  . Smokeless tobacco: Current User  Substance Use Topics  . Alcohol use: Never  . Drug use: Never     Allergies   Patient has no known allergies.   Review of Systems Review of Systems  Constitutional: Negative for activity change, appetite change, chills, fatigue and fever.  HENT: Positive for congestion, rhinorrhea and sinus pressure. Negative for ear pain, sore throat and trouble swallowing.   Eyes: Negative for discharge and redness.  Respiratory: Positive for cough, chest tightness,  shortness of breath and wheezing.   Cardiovascular: Negative for chest pain.  Gastrointestinal: Negative for abdominal pain, diarrhea, nausea and vomiting.  Musculoskeletal: Negative for myalgias.  Skin: Negative for rash.  Neurological: Negative for dizziness, light-headedness and headaches.     Physical Exam Triage Vital Signs ED Triage Vitals  Enc Vitals Group     BP 09/07/19 1307 129/86     Pulse Rate 09/07/19 1307 (!) 110     Resp 09/07/19 1307 (!) 22     Temp 09/07/19 1307 98.2 F (36.8 C)     Temp Source 09/07/19 1307 Oral     SpO2 09/07/19 1307 97 %     Weight --      Height --      Head Circumference --      Peak Flow --      Pain Score 09/07/19 1314 7     Pain Loc --      Pain Edu? --      Excl. in GC? --    No data found.  Updated Vital Signs BP 129/86 (BP Location: Right Arm)   Pulse 93   Temp 98.2 F (36.8 C) (Oral)   Resp (!) 22   LMP 08/07/2019 (Approximate)   SpO2 97%   Visual Acuity Right Eye Distance:   Left Eye Distance:   Bilateral Distance:    Right Eye Near:   Left Eye Near:    Bilateral Near:     Physical Exam Vitals and nursing note reviewed.  Constitutional:      General: She is not in acute distress.    Appearance: She is well-developed.  HENT:     Head: Normocephalic and atraumatic.     Ears:     Comments: Bilateral ears without tenderness to palpation of external auricle, tragus and mastoid, EAC's without erythema or swelling, TM's with good bony landmarks and cone of light. Non erythematous.    Mouth/Throat:     Comments: Oral mucosa pink and moist, no tonsillar enlargement or exudate. Posterior pharynx patent and nonerythematous, no uvula deviation or swelling. Normal phonation. Eyes:     Conjunctiva/sclera: Conjunctivae normal.  Cardiovascular:     Rate and Rhythm: Normal rate and regular rhythm.     Heart sounds: No murmur.  Pulmonary:     Effort: Pulmonary effort is normal. No respiratory distress.     Breath sounds:  Normal breath sounds.     Comments: Breathing comfortably at rest, CTABL, no wheezing, rales or other adventitious sounds auscultated Occasionally taking deeper breaths Abdominal:     Palpations: Abdomen is soft.     Tenderness: There is no abdominal tenderness.  Musculoskeletal:     Cervical back: Neck supple.     Comments: Bilateral lower leg symmetric, no calf swelling or tenderness  Skin:    General: Skin is warm and dry.  Neurological:  Mental Status: She is alert.      UC Treatments / Results  Labs (all labs ordered are listed, but only abnormal results are displayed) Labs Reviewed - No data to display  EKG   Radiology No results found.  Procedures Procedures (including critical care time)  Medications Ordered in UC Medications  dexamethasone (DECADRON) injection 10 mg (has no administration in time range)    Initial Impression / Assessment and Plan / UC Course  I have reviewed the triage vital signs and the nursing notes.  Pertinent labs & imaging results that were available during my care of the patient were reviewed by me and considered in my medical decision making (see chart for details).     Patient with persistent cough/Covid symptoms.  Symptoms began approximately 3 weeks ago, recent chest x-ray normal.  Vital signs stable without hypoxia or fever.  Tachycardia improved after patient had been sitting.  Lungs clear to auscultation.  Deferring further imaging at this time.  Main risk factor for possible PE is Covid infection and prior tobacco use.  No signs of DVT at this time.  History more consistent with bronchitis given reported wheezing.  Continuing albuterol, IM Decadron today.  Given length of symptoms we will also initiate on doxycycline to cover atypicals as well as sinusitis.  Cetirizine and Flonase for congestion and drainage. Do not suspect ACS. Continue to monitor, follow-up if symptoms progressing or worsening.   Final Clinical Impressions(s)  / UC Diagnoses   Final diagnoses:  COVID-19  Chest tightness     Discharge Instructions     We gave you an injection of Decadron today Continue inhaler as needed for shortness of breath and wheezing Begin doxycycline twice daily for the next 10 days Tessalon every 8 hours as needed for cough Begin omeprazole twice daily before meals to help with underlying reflux contributing to discomfort in chest  Please follow-up with chest discomfort for shortness of breath continuing to worsen/persist   ED Prescriptions    Medication Sig Dispense Auth. Provider   doxycycline (VIBRAMYCIN) 100 MG capsule  (Status: Discontinued) Take 1 capsule (100 mg total) by mouth 2 (two) times daily for 10 days. 20 capsule Carlynn Leduc C, PA-C   benzonatate (TESSALON) 200 MG capsule  (Status: Discontinued) Take 1 capsule (200 mg total) by mouth 3 (three) times daily as needed for up to 7 days for cough. 28 capsule Eragon Hammond C, PA-C   omeprazole (PRILOSEC) 20 MG capsule  (Status: Discontinued) Take 1 capsule (20 mg total) by mouth 2 (two) times daily before a meal for 14 days. 28 capsule Ogle Hoeffner C, PA-C   fluconazole (DIFLUCAN) 150 MG tablet  (Status: Discontinued) Take 1 tablet (150 mg total) by mouth once for 1 dose. 2 tablet Christyne Mccain C, PA-C   omeprazole (PRILOSEC) 20 MG capsule Take 1 capsule (20 mg total) by mouth 2 (two) times daily before a meal for 14 days. 28 capsule Briggette Najarian C, PA-C   doxycycline (VIBRAMYCIN) 100 MG capsule Take 1 capsule (100 mg total) by mouth 2 (two) times daily for 10 days. 20 capsule Shakeya Kerkman C, PA-C   fluconazole (DIFLUCAN) 150 MG tablet Take 1 tablet (150 mg total) by mouth once for 1 dose. 2 tablet Laurielle Selmon C, PA-C   benzonatate (TESSALON) 200 MG capsule Take 1 capsule (200 mg total) by mouth 3 (three) times daily as needed for up to 7 days for cough. 28 capsule Worthington Cruzan, [place] C, PA-C  PDMP not reviewed this encounter.   Janith Lima, PA-C 09/07/19 1404

## 2019-09-07 NOTE — Discharge Instructions (Signed)
We gave you an injection of Decadron today Continue inhaler as needed for shortness of breath and wheezing Begin doxycycline twice daily for the next 10 days Tessalon every 8 hours as needed for cough Begin omeprazole twice daily before meals to help with underlying reflux contributing to discomfort in chest  Please follow-up with chest discomfort for shortness of breath continuing to worsen/persist

## 2019-09-07 NOTE — ED Triage Notes (Addendum)
Pt c/o SOB, cough, congestion, difficulty clearing thick secretions. Using inhaler but states she feels that inhaler only helps with breathing for approx 1.5 hours.  Lungs sound CTA, pt slightly tachypneic.  States chest discomfort feels "heartburn" increases with cough, movement.  Speaks full long sentences rapidly and fluently. Mildly tachypneic, but returns to normal resp rate, when relaxed/distracted.  Pt here for re-eval, questions if she needs another rx of steroids, inhaler and/or abx.

## 2019-09-08 ENCOUNTER — Encounter (INDEPENDENT_AMBULATORY_CARE_PROVIDER_SITE_OTHER): Payer: Self-pay

## 2019-09-09 ENCOUNTER — Encounter (INDEPENDENT_AMBULATORY_CARE_PROVIDER_SITE_OTHER): Payer: Self-pay

## 2019-09-09 ENCOUNTER — Telehealth: Payer: Self-pay

## 2019-09-09 NOTE — Telephone Encounter (Signed)
Out going call to Patient to discuss new Sx.  Patient complains of feeling more weak .  States she slept 11 hour last night attributes it to the medication she is taking.  States she is slighty dizzy .  Can stand up.  Drinking water , eating pop cycles. Rates the weakness as moderate. Encouraged Pt to continue to hydrate herself, and rest.  Pt.  Voices understanding .

## 2019-09-10 ENCOUNTER — Encounter (INDEPENDENT_AMBULATORY_CARE_PROVIDER_SITE_OTHER): Payer: Self-pay

## 2019-09-11 ENCOUNTER — Encounter (INDEPENDENT_AMBULATORY_CARE_PROVIDER_SITE_OTHER): Payer: Self-pay

## 2019-09-12 ENCOUNTER — Encounter (INDEPENDENT_AMBULATORY_CARE_PROVIDER_SITE_OTHER): Payer: Self-pay

## 2019-09-13 ENCOUNTER — Encounter (INDEPENDENT_AMBULATORY_CARE_PROVIDER_SITE_OTHER): Payer: Self-pay

## 2019-09-13 ENCOUNTER — Telehealth: Payer: Self-pay | Admitting: *Deleted

## 2019-09-13 NOTE — Telephone Encounter (Signed)
Pt called regarding her questionnaire responses. She stated that she was having worsening shortness of breath since yesterday. It has been 3 weeks since she tested positive for covid 19. She has had an e visit and was prescribed medication for her symptoms. She stated that she feels some tightness in her chest along with reflux at times. Especially in the morning and evening. She does not think she needs to go to the ED. But understands that with struggling to breath, she will go. Advised her to try another e visit if it increases but not up to the point of respiratory distress. She voiced understanding. She is hydrating, taking her medications and using her inhaler.

## 2019-09-14 ENCOUNTER — Encounter (INDEPENDENT_AMBULATORY_CARE_PROVIDER_SITE_OTHER): Payer: Self-pay

## 2019-09-17 ENCOUNTER — Other Ambulatory Visit: Payer: Self-pay | Admitting: Family

## 2019-09-17 DIAGNOSIS — U071 COVID-19: Secondary | ICD-10-CM

## 2019-09-18 ENCOUNTER — Other Ambulatory Visit: Payer: Self-pay

## 2019-09-18 ENCOUNTER — Encounter: Payer: Self-pay | Admitting: Family Medicine

## 2019-09-18 ENCOUNTER — Ambulatory Visit (INDEPENDENT_AMBULATORY_CARE_PROVIDER_SITE_OTHER): Payer: Self-pay | Admitting: Family Medicine

## 2019-09-18 VITALS — BP 111/84 | HR 86

## 2019-09-18 DIAGNOSIS — M533 Sacrococcygeal disorders, not elsewhere classified: Secondary | ICD-10-CM

## 2019-09-18 DIAGNOSIS — R0600 Dyspnea, unspecified: Secondary | ICD-10-CM

## 2019-09-18 DIAGNOSIS — F32A Depression, unspecified: Secondary | ICD-10-CM

## 2019-09-18 DIAGNOSIS — M545 Low back pain, unspecified: Secondary | ICD-10-CM

## 2019-09-18 DIAGNOSIS — G8929 Other chronic pain: Secondary | ICD-10-CM

## 2019-09-18 DIAGNOSIS — F329 Major depressive disorder, single episode, unspecified: Secondary | ICD-10-CM

## 2019-09-18 DIAGNOSIS — M542 Cervicalgia: Secondary | ICD-10-CM

## 2019-09-18 DIAGNOSIS — F419 Anxiety disorder, unspecified: Secondary | ICD-10-CM

## 2019-09-18 DIAGNOSIS — R5383 Other fatigue: Secondary | ICD-10-CM

## 2019-09-18 MED ORDER — PREDNISONE 10 MG PO TABS: ORAL_TABLET | ORAL | 0 refills | Status: DC

## 2019-09-18 MED ORDER — FLOVENT HFA 110 MCG/ACT IN AERO: 2.0000 | INHALATION_SPRAY | Freq: Two times a day (BID) | RESPIRATORY_TRACT | 3 refills | Status: DC
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
## 2019-09-19 ENCOUNTER — Telehealth: Payer: Self-pay | Admitting: Family Medicine

## 2019-09-19 LAB — CBC WITH DIFFERENTIAL/PLATELET
Absolute Monocytes: 791 cells/uL (ref 200–950)
Basophils Absolute: 43 cells/uL (ref 0–200)
Basophils Relative: 0.5 %
Eosinophils Absolute: 119 cells/uL (ref 15–500)
Eosinophils Relative: 1.4 %
HCT: 40.8 % (ref 35.0–45.0)
Hemoglobin: 13.8 g/dL (ref 11.7–15.5)
Lymphs Abs: 1836 cells/uL (ref 850–3900)
MCH: 29.1 pg (ref 27.0–33.0)
MCHC: 33.8 g/dL (ref 32.0–36.0)
MCV: 85.9 fL (ref 80.0–100.0)
MPV: 10.2 fL (ref 7.5–12.5)
Monocytes Relative: 9.3 %
Neutro Abs: 5712 cells/uL (ref 1500–7800)
Neutrophils Relative %: 67.2 %
Platelets: 314 10*3/uL (ref 140–400)
RBC: 4.75 10*6/uL (ref 3.80–5.10)
RDW: 13.2 % (ref 11.0–15.0)
Total Lymphocyte: 21.6 %
WBC: 8.5 10*3/uL (ref 3.8–10.8)

## 2019-09-19 LAB — COMPREHENSIVE METABOLIC PANEL
AG Ratio: 1.9 (calc) (ref 1.0–2.5)
ALT: 194 U/L — ABNORMAL HIGH (ref 6–29)
AST: 87 U/L — ABNORMAL HIGH (ref 10–35)
Albumin: 4.5 g/dL (ref 3.6–5.1)
Alkaline phosphatase (APISO): 80 U/L (ref 31–125)
BUN: 12 mg/dL (ref 7–25)
CO2: 23 mmol/L (ref 20–32)
Calcium: 9.6 mg/dL (ref 8.6–10.2)
Chloride: 105 mmol/L (ref 98–110)
Creat: 0.75 mg/dL (ref 0.50–1.10)
Globulin: 2.4 g/dL (calc) (ref 1.9–3.7)
Glucose, Bld: 91 mg/dL (ref 65–99)
Potassium: 4.3 mmol/L (ref 3.5–5.3)
Sodium: 139 mmol/L (ref 135–146)
Total Bilirubin: 0.5 mg/dL (ref 0.2–1.2)
Total Protein: 6.9 g/dL (ref 6.1–8.1)

## 2019-09-19 LAB — SEDIMENTATION RATE: Sed Rate: 17 mm/h (ref 0–20)

## 2019-09-19 LAB — IRON,TIBC AND FERRITIN PANEL
%SAT: 24 % (calc) (ref 16–45)
Ferritin: 54 ng/mL (ref 16–232)
Iron: 91 ug/dL (ref 40–190)
TIBC: 374 mcg/dL (calc) (ref 250–450)

## 2019-09-19 LAB — THYROID PANEL WITH TSH
Free Thyroxine Index: 1.9 (ref 1.4–3.8)
T3 Uptake: 27 % (ref 22–35)
T4, Total: 7 ug/dL (ref 5.1–11.9)
TSH: 2.15 mIU/L

## 2019-09-19 LAB — C-REACTIVE PROTEIN: CRP: 5 mg/L (ref <8.0)

## 2019-09-19 LAB — VITAMIN D 25 HYDROXY (VIT D DEFICIENCY, FRACTURES): Vit D, 25-Hydroxy: 11 ng/mL — ABNORMAL LOW (ref 30–100)

## 2019-09-19 NOTE — Telephone Encounter (Signed)
Labs show:  Very low vitamin D at 11 (goal is 50-80).  I recommend Vitamin D3, 5,000 IU tablets, 2 of them daily (10,000 IU total) for 3 months and then 1 daily long-term after that.  Liver enzymes (AST, ALT) are slightly elevated.  Could be related to recent illness.  I recommend rechecking in 3-4 weeks to be sure they're normalized.  All else looks good.

## 2019-09-29 ENCOUNTER — Telehealth: Payer: Self-pay | Admitting: Family Medicine

## 2019-09-29 NOTE — Telephone Encounter (Signed)
Doran Stabler R  Thanks for trusting the care of your patient to our office. We would love to schedule an appointment, however we have made several attempts to contact patient to reschedule her cancelled appointment.       Bonnita Nasuti  Referral Coordinator  [place]  [phone]

## 2019-10-06 ENCOUNTER — Ambulatory Visit (INDEPENDENT_AMBULATORY_CARE_PROVIDER_SITE_OTHER): Payer: Self-pay | Admitting: Family Medicine

## 2019-10-06 ENCOUNTER — Encounter: Payer: Self-pay | Admitting: Family Medicine

## 2019-10-06 ENCOUNTER — Other Ambulatory Visit: Payer: Self-pay

## 2019-10-06 VITALS — BP 113/77 | HR 95 | Resp 16

## 2019-10-06 DIAGNOSIS — R0600 Dyspnea, unspecified: Secondary | ICD-10-CM

## 2019-10-06 MED ORDER — ALBUTEROL SULFATE 1.25 MG/3ML IN NEBU: 1.0000 | INHALATION_SOLUTION | Freq: Four times a day (QID) | RESPIRATORY_TRACT | 12 refills | Status: DC | PRN

## 2019-10-06 MED ORDER — AMOXICILLIN-POT CLAVULANATE 875-125 MG PO TABS: 1.0000 | ORAL_TABLET | Freq: Two times a day (BID) | ORAL | 0 refills | Status: DC

## 2019-10-06 NOTE — Progress Notes (Signed)
   Office Visit Note   Patient: Lindsey Graham           Date of Birth: 06-13-1976           MRN: [id number] Visit Date: 10/06/2019 Requested by: Lavada Mesi, MD [address] [place],  [place] [address] PCP: Lavada Mesi, MD  Subjective: Chief Complaint  Patient presents with  . continued SOB & tightness in the chest (mainly right side)    HPI: She is here with persistent shortness of breath.  Prednisone did not make much difference, the inhaler helps but not quite enough.  She is concerned because her sputum is still pretty yellow.  She is not having fevers but is having some chills.  In the past she has used an albuterol nebulizer and wondered whether that might help.              ROS:   All other systems were reviewed and are negative.  Objective: Vital Signs: BP 113/77   Pulse 95   Resp 16   SpO2 99%   Physical Exam:  General:  Alert and oriented, in no acute distress. Pulm:  Breathing unlabored. Psy:  Normal mood, congruent affect.  CV: Regular rate and rhythm without murmurs, rubs, or gallops.  No peripheral edema.  2+ radial and posterior tibial pulses. Lungs: Clear to auscultation throughout with no wheezing or areas of consolidation. She has a tender trigger point in the right rhomboid area that reproduces some of her pain.   Imaging: None today  Assessment & Plan: 1.  Persistent cough and shortness of breath status post COVID-19 infection -We will draw a D-dimer to be sure there is no sign of pulmonary embolus. -We will start Augmentin.  Albuterol nebulizer as needed. -Could contemplate pulmonary consult if symptoms persist. -Deep tissue massage using a tennis ball for the trigger point.     Procedures: No procedures performed  No notes on file     PMFS History: Patient Active Problem List   Diagnosis Date Noted  . Head injury 06/04/2019  . Post concussion syndrome 06/04/2019  . Chronic migraine 06/04/2019  . Back pain 06/04/2019   History  reviewed. No pertinent past medical history.  Family History  Problem Relation Age of Onset  . COPD Father     History reviewed. No pertinent surgical history. Social History   Occupational History  . Not on file  Tobacco Use  . Smoking status: Former Smoker    Types: E-cigarettes  . Smokeless tobacco: Current User  Substance and Sexual Activity  . Alcohol use: Never  . Drug use: Never  . Sexual activity: Yes

## 2019-10-07 ENCOUNTER — Telehealth: Payer: Self-pay | Admitting: Family Medicine

## 2019-10-07 DIAGNOSIS — R7989 Other specified abnormal findings of blood chemistry: Secondary | ICD-10-CM

## 2019-10-07 DIAGNOSIS — R0602 Shortness of breath: Secondary | ICD-10-CM

## 2019-10-07 DIAGNOSIS — R0600 Dyspnea, unspecified: Secondary | ICD-10-CM

## 2019-10-07 LAB — D-DIMER, QUANTITATIVE: D-Dimer, Quant: 0.71 mcg/mL FEU — ABNORMAL HIGH (ref <0.50)

## 2019-10-07 NOTE — Telephone Encounter (Signed)
D Dimer positive.  Will order chest CT angio.

## 2019-10-08 ENCOUNTER — Telehealth: Payer: Self-pay | Admitting: Family Medicine

## 2019-10-08 ENCOUNTER — Ambulatory Visit
Admission: RE | Admit: 2019-10-08 | Discharge: 2019-10-08 | Disposition: A | Discharge status: Home / Self Care | Payer: No Typology Code available for payment source | Source: Ambulatory Visit | Attending: Family Medicine | Admitting: Family Medicine

## 2019-10-08 DIAGNOSIS — R911 Solitary pulmonary nodule: Secondary | ICD-10-CM | POA: Insufficient documentation

## 2019-10-08 DIAGNOSIS — R0602 Shortness of breath: Secondary | ICD-10-CM

## 2019-10-08 MED ORDER — IOPAMIDOL (ISOVUE-370) INJECTION 76%
75.0000 mL | Freq: Once | INTRAVENOUS | Status: AC | PRN
  Administered 2019-10-08: 11:00:00 75 mL via INTRAVENOUS

## 2019-10-08 NOTE — Telephone Encounter (Signed)
CT does not show a blood clot.

## 2019-10-16 ENCOUNTER — Telehealth: Payer: Self-pay | Admitting: Family Medicine

## 2019-10-16 NOTE — Telephone Encounter (Signed)
Patient called and stated she had blood work done and didn't get any results on the liver test.  Please call patient at [phone]

## 2019-10-16 NOTE — Telephone Encounter (Signed)
The liver function was not tested on 10/06/19, just the D-Dimer. She will come in on 10/21/19 around 10 to have this blood test drawn.

## 2019-10-21 ENCOUNTER — Ambulatory Visit (INDEPENDENT_AMBULATORY_CARE_PROVIDER_SITE_OTHER): Payer: Self-pay

## 2019-10-21 ENCOUNTER — Other Ambulatory Visit: Payer: Self-pay

## 2019-10-21 DIAGNOSIS — R7989 Other specified abnormal findings of blood chemistry: Secondary | ICD-10-CM

## 2019-10-21 DIAGNOSIS — R945 Abnormal results of liver function studies: Secondary | ICD-10-CM

## 2019-10-21 NOTE — Progress Notes (Signed)
Follow up hepatic function panel drawn per Dr. Prince Rome, to recheck abnormal results from approximately 4 weeks ago.

## 2019-10-22 ENCOUNTER — Telehealth: Payer: Self-pay | Admitting: Family Medicine

## 2019-10-22 LAB — HEPATIC FUNCTION PANEL
AG Ratio: 2 (calc) (ref 1.0–2.5)
ALT: 19 U/L (ref 6–29)
AST: 17 U/L (ref 10–35)
Albumin: 4.5 g/dL (ref 3.6–5.1)
Alkaline phosphatase (APISO): 52 U/L (ref 31–125)
Bilirubin, Direct: 0.1 mg/dL (ref 0.0–0.2)
Globulin: 2.2 g/dL (calc) (ref 1.9–3.7)
Indirect Bilirubin: 0.2 mg/dL (calc) (ref 0.2–1.2)
Total Bilirubin: 0.3 mg/dL (ref 0.2–1.2)
Total Protein: 6.7 g/dL (ref 6.1–8.1)

## 2019-10-22 NOTE — Telephone Encounter (Signed)
Liver function is now normal.

## 2019-10-27 ENCOUNTER — Other Ambulatory Visit: Payer: Self-pay

## 2019-10-27 ENCOUNTER — Telehealth: Payer: Self-pay | Admitting: Family Medicine

## 2019-10-27 ENCOUNTER — Ambulatory Visit
Admission: RE | Admit: 2019-10-27 | Discharge: 2019-10-27 | Disposition: A | Discharge status: Home / Self Care | Payer: Self-pay | Source: Ambulatory Visit | Attending: Family Medicine | Admitting: Family Medicine

## 2019-10-27 DIAGNOSIS — G8929 Other chronic pain: Secondary | ICD-10-CM

## 2019-10-27 DIAGNOSIS — M533 Sacrococcygeal disorders, not elsewhere classified: Secondary | ICD-10-CM

## 2019-10-27 NOTE — Telephone Encounter (Signed)
Lumbar MRI scan shows L5-S1 disc degeneration with bulging and facet joint hypertrophy resulting in narrowing of the nerve openings on both sides and possible compression of the S1 nerves.  Treatment options would include a trial of physical therapy rather than chiropractic, or possibly referral for epidural steroid injections.  Surgery would be the last resort option.

## 2019-10-29 ENCOUNTER — Encounter: Payer: Self-pay | Admitting: Family Medicine

## 2019-10-30 ENCOUNTER — Encounter: Payer: Self-pay | Admitting: Family Medicine

## 2019-10-31 ENCOUNTER — Other Ambulatory Visit: Payer: Self-pay

## 2019-10-31 ENCOUNTER — Ambulatory Visit: Payer: Self-pay | Admitting: Family Medicine

## 2019-10-31 ENCOUNTER — Encounter: Payer: Self-pay | Admitting: Family Medicine

## 2019-10-31 DIAGNOSIS — M542 Cervicalgia: Secondary | ICD-10-CM

## 2019-10-31 DIAGNOSIS — G8929 Other chronic pain: Secondary | ICD-10-CM

## 2019-10-31 DIAGNOSIS — M545 Low back pain, unspecified: Secondary | ICD-10-CM
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
## 2019-11-07 ENCOUNTER — Encounter: Payer: Self-pay | Admitting: Rehabilitative and Restorative Service Providers"

## 2019-11-07 ENCOUNTER — Other Ambulatory Visit: Payer: Self-pay

## 2019-11-07 ENCOUNTER — Ambulatory Visit (INDEPENDENT_AMBULATORY_CARE_PROVIDER_SITE_OTHER): Payer: Self-pay | Admitting: Rehabilitative and Restorative Service Providers"

## 2019-11-07 DIAGNOSIS — M79604 Pain in right leg: Secondary | ICD-10-CM

## 2019-11-07 DIAGNOSIS — M6281 Muscle weakness (generalized): Secondary | ICD-10-CM

## 2019-11-07 DIAGNOSIS — R262 Difficulty in walking, not elsewhere classified: Secondary | ICD-10-CM

## 2019-11-07 DIAGNOSIS — M545 Low back pain: Secondary | ICD-10-CM

## 2019-11-07 DIAGNOSIS — G8929 Other chronic pain: Secondary | ICD-10-CM

## 2019-11-07 DIAGNOSIS — M542 Cervicalgia: Secondary | ICD-10-CM

## 2019-11-07 NOTE — Patient Instructions (Signed)
Access Code: [email or web address] URL: [email or web address] Date: 11/07/2019 Prepared by: Chyrel Masson  Exercises Prone Press Up on Elbows - 2 x daily - 7 x weekly - 3 sets - 10 reps Seated Gentle Upper Trapezius Stretch - 2 x daily - 7 x weekly - 5 reps - 1 sets - 15 hold Supine Lower Trunk Rotation - 2 x daily - 7 x weekly - 5 reps - 1 sets - 15 hold Shoulder External Rotation and Scapular Retraction with Resistance - 2 x daily - 7 x weekly - 3 sets - 10 reps
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
## 2019-11-10 ENCOUNTER — Encounter: Payer: Self-pay | Admitting: Rehabilitative and Restorative Service Providers"

## 2019-11-10 ENCOUNTER — Ambulatory Visit (INDEPENDENT_AMBULATORY_CARE_PROVIDER_SITE_OTHER): Payer: Self-pay | Admitting: Rehabilitative and Restorative Service Providers"

## 2019-11-10 ENCOUNTER — Other Ambulatory Visit: Payer: Self-pay

## 2019-11-10 DIAGNOSIS — R262 Difficulty in walking, not elsewhere classified: Secondary | ICD-10-CM

## 2019-11-10 DIAGNOSIS — M542 Cervicalgia: Secondary | ICD-10-CM

## 2019-11-10 DIAGNOSIS — G8929 Other chronic pain: Secondary | ICD-10-CM

## 2019-11-10 DIAGNOSIS — M545 Low back pain: Secondary | ICD-10-CM

## 2019-11-10 DIAGNOSIS — M79604 Pain in right leg: Secondary | ICD-10-CM

## 2019-11-10 DIAGNOSIS — M6281 Muscle weakness (generalized): Secondary | ICD-10-CM
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
## 2019-11-12 ENCOUNTER — Encounter: Payer: Self-pay | Admitting: Physical Therapy

## 2019-11-12 ENCOUNTER — Other Ambulatory Visit: Payer: Self-pay

## 2019-11-12 ENCOUNTER — Ambulatory Visit (INDEPENDENT_AMBULATORY_CARE_PROVIDER_SITE_OTHER): Payer: Self-pay | Admitting: Physical Therapy

## 2019-11-12 ENCOUNTER — Telehealth: Payer: Self-pay | Admitting: Family Medicine

## 2019-11-12 DIAGNOSIS — G8929 Other chronic pain: Secondary | ICD-10-CM

## 2019-11-12 DIAGNOSIS — M6281 Muscle weakness (generalized): Secondary | ICD-10-CM

## 2019-11-12 DIAGNOSIS — M545 Low back pain: Secondary | ICD-10-CM

## 2019-11-12 DIAGNOSIS — R262 Difficulty in walking, not elsewhere classified: Secondary | ICD-10-CM

## 2019-11-12 DIAGNOSIS — M542 Cervicalgia: Secondary | ICD-10-CM

## 2019-11-12 DIAGNOSIS — M79604 Pain in right leg: Secondary | ICD-10-CM

## 2019-11-12 NOTE — Telephone Encounter (Signed)
Let her know that I do not think Emgality was cause of SHOB but it is impossible to know. She reported to me in 07/2019 that she was doing very well on it. I would be ok with her resuming therapy but it can be expensive. Stress is a big trigger. I would like for her to be in with PCP to help her manage that. I am happy to see her back for follow up if needed.

## 2019-11-12 NOTE — Telephone Encounter (Signed)
Patient called wanting to discuss the emgality. Pt states she does not qualify for the financial assistance and its getting a bit pricey to get the medication and would like to discuss with RN about  weaning off the medication
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
## 2019-11-12 NOTE — Telephone Encounter (Signed)
I called patient about her headaches and wanting to know should she start back taking emgality. 06/2019- Pt started taking emgaiity and was having some slight breathing problems. Pt continue to take injection until  January 2021. PT caught COVID 19 08/2019  and develop some increasing breathing issues and had some elevated liver enzymes recovering from COVID 19. PT took her injection nov, dec and January 2021. Pt has a daily headache but not intense. She is only taking zoloft, gabapentin.and is not taking the maxalt. PT does not have insurance and is paying cash for everything. She is not working at the moment. She is currently in Pt and is getting dry needling done.Her breathing has improve. The emgality is between 580 and 600 dollars. She wants to know should she continue the injection and would that cause her to have increase liver enzymes or could COVID had cause the increase.i stated message will be sent to provider. She verbalized understanding.

## 2019-11-13 NOTE — Telephone Encounter (Signed)
Ii tried to call back with Amy NP recommendations vm was full.

## 2019-11-17 NOTE — Telephone Encounter (Signed)
I tried to call pt to give her Amy NP recommendations. Vm was full.

## 2019-11-17 NOTE — Telephone Encounter (Signed)
I called pt to give her Amy NP recommendations listed below. PT verbalized understanding. I stated a mychart message will be sent to pt assistance for emgaility.Pt does not have any insurance.PT will continue the medication with self pay until she apply for pt assistance. I sent her the pt assistance via mychart. Pt will look into when she gets home.

## 2019-11-17 NOTE — Telephone Encounter (Signed)
Sent pt mychart message of Amy NP recommendations.

## 2019-11-19 ENCOUNTER — Encounter: Payer: Self-pay | Admitting: Physical Therapy

## 2019-11-19 ENCOUNTER — Ambulatory Visit (INDEPENDENT_AMBULATORY_CARE_PROVIDER_SITE_OTHER): Payer: Self-pay | Admitting: Physical Therapy

## 2019-11-19 ENCOUNTER — Other Ambulatory Visit: Payer: Self-pay

## 2019-11-19 ENCOUNTER — Encounter: Payer: Self-pay | Admitting: Family Medicine

## 2019-11-19 DIAGNOSIS — R262 Difficulty in walking, not elsewhere classified: Secondary | ICD-10-CM

## 2019-11-19 DIAGNOSIS — M545 Low back pain: Secondary | ICD-10-CM

## 2019-11-19 DIAGNOSIS — M79604 Pain in right leg: Secondary | ICD-10-CM

## 2019-11-19 DIAGNOSIS — M6281 Muscle weakness (generalized): Secondary | ICD-10-CM

## 2019-11-19 DIAGNOSIS — M542 Cervicalgia: Secondary | ICD-10-CM

## 2019-11-19 DIAGNOSIS — G8929 Other chronic pain: Secondary | ICD-10-CM
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
## 2019-11-24 ENCOUNTER — Encounter: Payer: Self-pay | Admitting: Family Medicine

## 2019-11-25 ENCOUNTER — Other Ambulatory Visit: Payer: Self-pay

## 2019-11-25 ENCOUNTER — Ambulatory Visit: Payer: Self-pay | Admitting: Rehabilitative and Restorative Service Providers"

## 2019-11-25 DIAGNOSIS — M542 Cervicalgia: Secondary | ICD-10-CM

## 2019-11-25 DIAGNOSIS — M79604 Pain in right leg: Secondary | ICD-10-CM

## 2019-11-25 DIAGNOSIS — M545 Low back pain, unspecified: Secondary | ICD-10-CM

## 2019-11-25 DIAGNOSIS — R262 Difficulty in walking, not elsewhere classified: Secondary | ICD-10-CM

## 2019-11-25 DIAGNOSIS — G8929 Other chronic pain: Secondary | ICD-10-CM

## 2019-11-25 DIAGNOSIS — M6281 Muscle weakness (generalized): Secondary | ICD-10-CM
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
## 2019-11-26 ENCOUNTER — Telehealth: Payer: Self-pay | Admitting: Family Medicine

## 2019-11-26 NOTE — Telephone Encounter (Signed)
Cervical MRI scan is notable for the following:  There is a right-sided disc bulge at C3-4 which could potentially affect the right C4 nerve root.  There is a left-sided disc bulge combined with bone spurring at C4-5 which could affect left C5 nerve root.  It is also touching the spinal cord.  There is a left-sided disc bulge with bone spur at C5-6 which causes narrowing of the left C6 nerve opening.  There is a small disc protrusion at C6-7 toward the right which could affect the right C7 nerve root.  If neck symptoms do not improve with physical therapy, could contemplate referral for epidural steroid injections.  If still no improvement with injections, then could contemplate surgical consultation.

## 2019-11-27 ENCOUNTER — Other Ambulatory Visit: Payer: Self-pay

## 2019-11-27 ENCOUNTER — Encounter: Payer: Self-pay | Admitting: Rehabilitative and Restorative Service Providers"

## 2019-11-27 ENCOUNTER — Ambulatory Visit (INDEPENDENT_AMBULATORY_CARE_PROVIDER_SITE_OTHER): Payer: Self-pay | Admitting: Rehabilitative and Restorative Service Providers"

## 2019-11-27 DIAGNOSIS — M6281 Muscle weakness (generalized): Secondary | ICD-10-CM

## 2019-11-27 DIAGNOSIS — G8929 Other chronic pain: Secondary | ICD-10-CM

## 2019-11-27 DIAGNOSIS — M79604 Pain in right leg: Secondary | ICD-10-CM

## 2019-11-27 DIAGNOSIS — M542 Cervicalgia: Secondary | ICD-10-CM

## 2019-11-27 DIAGNOSIS — R262 Difficulty in walking, not elsewhere classified: Secondary | ICD-10-CM

## 2019-11-27 DIAGNOSIS — M545 Low back pain: Secondary | ICD-10-CM
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
## 2019-12-02 ENCOUNTER — Encounter: Payer: Self-pay | Admitting: Rehabilitative and Restorative Service Providers"

## 2019-12-02 ENCOUNTER — Encounter: Payer: Self-pay | Admitting: Family Medicine

## 2019-12-02 ENCOUNTER — Other Ambulatory Visit: Payer: Self-pay

## 2019-12-02 ENCOUNTER — Ambulatory Visit (INDEPENDENT_AMBULATORY_CARE_PROVIDER_SITE_OTHER): Payer: Self-pay | Admitting: Family Medicine

## 2019-12-02 ENCOUNTER — Other Ambulatory Visit: Payer: Self-pay | Admitting: Physical Medicine and Rehabilitation

## 2019-12-02 ENCOUNTER — Ambulatory Visit (INDEPENDENT_AMBULATORY_CARE_PROVIDER_SITE_OTHER): Payer: Self-pay | Admitting: Physical Therapy

## 2019-12-02 ENCOUNTER — Telehealth: Payer: Self-pay | Admitting: *Deleted

## 2019-12-02 DIAGNOSIS — K5900 Constipation, unspecified: Secondary | ICD-10-CM

## 2019-12-02 DIAGNOSIS — G8929 Other chronic pain: Secondary | ICD-10-CM

## 2019-12-02 DIAGNOSIS — R262 Difficulty in walking, not elsewhere classified: Secondary | ICD-10-CM

## 2019-12-02 DIAGNOSIS — M545 Low back pain, unspecified: Secondary | ICD-10-CM

## 2019-12-02 DIAGNOSIS — M5442 Lumbago with sciatica, left side: Secondary | ICD-10-CM

## 2019-12-02 DIAGNOSIS — F411 Generalized anxiety disorder: Secondary | ICD-10-CM

## 2019-12-02 DIAGNOSIS — M542 Cervicalgia: Secondary | ICD-10-CM

## 2019-12-02 DIAGNOSIS — R0600 Dyspnea, unspecified: Secondary | ICD-10-CM

## 2019-12-02 DIAGNOSIS — M5441 Lumbago with sciatica, right side: Secondary | ICD-10-CM

## 2019-12-02 DIAGNOSIS — R945 Abnormal results of liver function studies: Secondary | ICD-10-CM

## 2019-12-02 DIAGNOSIS — R7989 Other specified abnormal findings of blood chemistry: Secondary | ICD-10-CM

## 2019-12-02 DIAGNOSIS — M6281 Muscle weakness (generalized): Secondary | ICD-10-CM

## 2019-12-02 DIAGNOSIS — R140 Abdominal distension (gaseous): Secondary | ICD-10-CM

## 2019-12-02 DIAGNOSIS — E559 Vitamin D deficiency, unspecified: Secondary | ICD-10-CM

## 2019-12-02 MED ORDER — DIAZEPAM 5 MG PO TABS: ORAL_TABLET | ORAL | 0 refills | Status: DC

## 2019-12-02 MED ORDER — ALBUTEROL SULFATE HFA 108 (90 BASE) MCG/ACT IN AERS: 2.0000 | INHALATION_SPRAY | Freq: Four times a day (QID) | RESPIRATORY_TRACT | 6 refills | Status: DC | PRN

## 2019-12-02 NOTE — Progress Notes (Signed)
Pre-procedure diazepam ordered for pre-operative anxiety.  
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
## 2019-12-02 NOTE — Telephone Encounter (Signed)
Called pt and advised.  

## 2019-12-02 NOTE — Patient Instructions (Signed)
Gaze Stabilization: Sitting    Keeping eyes on target on wall 3-5 feet away, tilt head down 15-30 and move head side to side for __30__ seconds. Repeat while moving head up and down for _30___ seconds. Do _3-5_ sessions per day.   Copyright  VHI. All rights reserved.   Gaze Stabilization: Tip Card  1.Target must remain in focus, not blurry, and appear stationary while head is in motion. 2.Perform exercises with small head movements (45 to either side of midline). 3.Increase speed of head motion so long as target is in focus. 4.If you wear eyeglasses, be sure you can see target through lens (therapist will give specific instructions for bifocal / progressive lenses). 5.These exercises may provoke dizziness or nausea. Work through these symptoms. If too dizzy, slow head movement slightly. Rest between each exercise. 6.Exercises demand concentration; avoid distractions. 7.For safety, perform standing exercises close to a counter, wall, corner, or next to someone.  Copyright  VHI. All rights reserved.

## 2019-12-02 NOTE — Telephone Encounter (Signed)
Done
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
## 2019-12-04 ENCOUNTER — Other Ambulatory Visit: Payer: Self-pay

## 2019-12-04 ENCOUNTER — Ambulatory Visit (INDEPENDENT_AMBULATORY_CARE_PROVIDER_SITE_OTHER): Payer: Self-pay | Admitting: Physical Therapy

## 2019-12-04 ENCOUNTER — Encounter: Payer: Self-pay | Admitting: Physical Therapy

## 2019-12-04 ENCOUNTER — Encounter: Payer: Self-pay | Admitting: Rehabilitative and Restorative Service Providers"

## 2019-12-04 DIAGNOSIS — M5442 Lumbago with sciatica, left side: Secondary | ICD-10-CM

## 2019-12-04 DIAGNOSIS — R262 Difficulty in walking, not elsewhere classified: Secondary | ICD-10-CM

## 2019-12-04 DIAGNOSIS — M6281 Muscle weakness (generalized): Secondary | ICD-10-CM

## 2019-12-04 DIAGNOSIS — M542 Cervicalgia: Secondary | ICD-10-CM

## 2019-12-04 DIAGNOSIS — M5441 Lumbago with sciatica, right side: Secondary | ICD-10-CM

## 2019-12-04 DIAGNOSIS — G8929 Other chronic pain: Secondary | ICD-10-CM

## 2019-12-04 DIAGNOSIS — R42 Dizziness and giddiness: Secondary | ICD-10-CM

## 2019-12-04 DIAGNOSIS — M545 Low back pain, unspecified: Secondary | ICD-10-CM
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
## 2019-12-04 NOTE — Patient Instructions (Signed)
Access Code: [email or web address] URL: [email or web address] Date: 12/04/2019 Prepared by: Moshe Cipro  Exercises Romberg Stance Eyes Closed on Foam Pad - 2 x daily - 7 x weekly - 5 reps - 1 sets - 10-15 sec hold Wide Stance with Eyes Closed and Head Rotation on Foam Pad - 2 x daily - 7 x weekly - 1 reps - 1 sets - 10 rotations each way

## 2019-12-09 ENCOUNTER — Encounter: Payer: Self-pay | Admitting: Rehabilitative and Restorative Service Providers"

## 2019-12-09 ENCOUNTER — Other Ambulatory Visit: Payer: Self-pay

## 2019-12-09 ENCOUNTER — Ambulatory Visit (INDEPENDENT_AMBULATORY_CARE_PROVIDER_SITE_OTHER): Payer: Self-pay | Admitting: Rehabilitative and Restorative Service Providers"

## 2019-12-09 DIAGNOSIS — M5442 Lumbago with sciatica, left side: Secondary | ICD-10-CM

## 2019-12-09 DIAGNOSIS — M542 Cervicalgia: Secondary | ICD-10-CM

## 2019-12-09 DIAGNOSIS — M545 Low back pain, unspecified: Secondary | ICD-10-CM

## 2019-12-09 DIAGNOSIS — M5441 Lumbago with sciatica, right side: Secondary | ICD-10-CM

## 2019-12-09 DIAGNOSIS — M79604 Pain in right leg: Secondary | ICD-10-CM

## 2019-12-09 DIAGNOSIS — M6281 Muscle weakness (generalized): Secondary | ICD-10-CM

## 2019-12-09 DIAGNOSIS — R262 Difficulty in walking, not elsewhere classified: Secondary | ICD-10-CM

## 2019-12-09 DIAGNOSIS — G8929 Other chronic pain: Secondary | ICD-10-CM

## 2019-12-09 DIAGNOSIS — R42 Dizziness and giddiness: Secondary | ICD-10-CM
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
## 2019-12-11 ENCOUNTER — Other Ambulatory Visit: Payer: Self-pay

## 2019-12-11 ENCOUNTER — Ambulatory Visit (INDEPENDENT_AMBULATORY_CARE_PROVIDER_SITE_OTHER): Payer: Self-pay | Admitting: Physical Therapy

## 2019-12-11 ENCOUNTER — Encounter: Payer: Self-pay | Admitting: Rehabilitative and Restorative Service Providers"

## 2019-12-11 ENCOUNTER — Encounter: Payer: Self-pay | Admitting: Physical Therapy

## 2019-12-11 DIAGNOSIS — M542 Cervicalgia: Secondary | ICD-10-CM

## 2019-12-11 DIAGNOSIS — M6281 Muscle weakness (generalized): Secondary | ICD-10-CM

## 2019-12-11 DIAGNOSIS — R42 Dizziness and giddiness: Secondary | ICD-10-CM

## 2019-12-11 DIAGNOSIS — G8929 Other chronic pain: Secondary | ICD-10-CM

## 2019-12-11 DIAGNOSIS — R262 Difficulty in walking, not elsewhere classified: Secondary | ICD-10-CM

## 2019-12-11 DIAGNOSIS — M5442 Lumbago with sciatica, left side: Secondary | ICD-10-CM

## 2019-12-11 DIAGNOSIS — M5441 Lumbago with sciatica, right side: Secondary | ICD-10-CM

## 2019-12-11 DIAGNOSIS — M545 Low back pain: Secondary | ICD-10-CM
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
## 2019-12-22 ENCOUNTER — Ambulatory Visit (INDEPENDENT_AMBULATORY_CARE_PROVIDER_SITE_OTHER): Payer: Self-pay | Admitting: Physical Medicine and Rehabilitation

## 2019-12-22 ENCOUNTER — Ambulatory Visit: Payer: Self-pay

## 2019-12-22 ENCOUNTER — Encounter: Payer: Self-pay | Admitting: Physical Medicine and Rehabilitation

## 2019-12-22 ENCOUNTER — Other Ambulatory Visit: Payer: Self-pay

## 2019-12-22 VITALS — BP 117/75 | HR 98

## 2019-12-22 DIAGNOSIS — M5116 Intervertebral disc disorders with radiculopathy, lumbar region: Secondary | ICD-10-CM

## 2019-12-22 DIAGNOSIS — M48061 Spinal stenosis, lumbar region without neurogenic claudication: Secondary | ICD-10-CM

## 2019-12-22 DIAGNOSIS — M5416 Radiculopathy, lumbar region: Secondary | ICD-10-CM

## 2019-12-22 MED ORDER — METHYLPREDNISOLONE ACETATE 80 MG/ML IJ SUSP
40.0000 mg | Freq: Once | INTRAMUSCULAR | Status: AC
  Administered 2019-12-22: 40 mg
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
## 2019-12-23 NOTE — Procedures (Signed)
Lumbar Epidural Steroid Injection - Interlaminar Approach with Fluoroscopic Guidance  Patient: Lindsey Graham      Date of Birth: 01-Aug-1976 MRN: [id number] PCP: Lavada Mesi, MD      Visit Date: 12/22/2019   Universal Protocol:     Consent Given By: the patient  Position: PRONE  Additional Comments: Vital signs were monitored before and after the procedure. Patient was prepped and draped in the usual sterile fashion. The correct patient, procedure, and site was verified.   Injection Procedure Details:  Procedure Site One Meds Administered:  Meds ordered this encounter  Medications  . methylPREDNISolone acetate (DEPO-MEDROL) injection 40 mg     Laterality: Midline  Location/Site: Patient has transitional S1 segment that is almost a fully developed vertebral body L5-S1  Needle size: 20 G  Needle type: Tuohy  Needle Placement: Paramedian epidural  Findings:   -Comments: Excellent flow of contrast into the epidural space.  Procedure Details: Using a paramedian approach from the side mentioned above, the region overlying the inferior lamina was localized under fluoroscopic visualization and the soft tissues overlying this structure were infiltrated with 4 ml. of 1% Lidocaine without Epinephrine. The Tuohy needle was inserted into the epidural space using a paramedian approach.   The epidural space was localized using loss of resistance along with lateral and bi-planar fluoroscopic views.  After negative aspirate for air, blood, and CSF, a 2 ml. volume of Isovue-250 was injected into the epidural space and the flow of contrast was observed. Radiographs were obtained for documentation purposes.    The injectate was administered into the level noted above.   Additional Comments:  The patient tolerated the procedure well Dressing: 2 x 2 sterile gauze and Band-Aid    Post-procedure details: Patient was observed during the procedure. Post-procedure instructions were  reviewed.  Patient left the clinic in stable condition.

## 2019-12-30 ENCOUNTER — Ambulatory Visit: Payer: Self-pay

## 2019-12-30 ENCOUNTER — Encounter: Payer: Self-pay | Admitting: Physical Medicine and Rehabilitation

## 2019-12-30 ENCOUNTER — Other Ambulatory Visit: Payer: Self-pay

## 2019-12-30 ENCOUNTER — Ambulatory Visit (INDEPENDENT_AMBULATORY_CARE_PROVIDER_SITE_OTHER): Payer: Self-pay | Admitting: Physical Medicine and Rehabilitation

## 2019-12-30 VITALS — BP 103/75 | HR 101

## 2019-12-30 DIAGNOSIS — M5412 Radiculopathy, cervical region: Secondary | ICD-10-CM

## 2019-12-30 MED ORDER — METHYLPREDNISOLONE ACETATE 80 MG/ML IJ SUSP
40.0000 mg | Freq: Once | INTRAMUSCULAR | Status: AC
  Administered 2019-12-30: 40 mg
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
## 2019-12-30 NOTE — Procedures (Signed)
Cervical Epidural Steroid Injection - Interlaminar Approach with Fluoroscopic Guidance  Patient: Lindsey Graham      Date of Birth: 1975/12/18 MRN: [id number] PCP: Lavada Mesi, MD      Visit Date: 12/30/2019   Universal Protocol:    Date/Time: 05/18/211:47 PM  Consent Given By: the patient  Position: PRONE  Additional Comments: Vital signs were monitored before and after the procedure. Patient was prepped and draped in the usual sterile fashion. The correct patient, procedure, and site was verified.   Injection Procedure Details:  Procedure Site One Meds Administered:  Meds ordered this encounter  Medications  . methylPREDNISolone acetate (DEPO-MEDROL) injection 40 mg     Laterality: Left  Location/Site: C7-T1  Needle size: 20 G  Needle type: Touhy  Needle Placement: Paramedian epidural space  Findings:  -Comments: Excellent flow of contrast into the epidural space.  Procedure Details: Using a paramedian approach from the side mentioned above, the region overlying the inferior lamina was localized under fluoroscopic visualization and the soft tissues overlying this structure were infiltrated with 4 ml. of 1% Lidocaine without Epinephrine. A # 20 gauge, Tuohy needle was inserted into the epidural space using a paramedian approach.  The epidural space was localized using loss of resistance along with lateral and contralateral oblique bi-planar fluoroscopic views.  After negative aspirate for air, blood, and CSF, a 2 ml. volume of Isovue-250 was injected into the epidural space and the flow of contrast was observed. Radiographs were obtained for documentation purposes.   The injectate was administered into the level noted above.  Additional Comments:  The patient tolerated the procedure well Dressing: 2 x 2 sterile gauze and Band-Aid    Post-procedure details: Patient was observed during the procedure. Post-procedure instructions were reviewed.  Patient left the  clinic in stable condition.
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
## 2020-01-06 ENCOUNTER — Encounter: Payer: Self-pay | Admitting: Physical Therapy

## 2020-01-06 ENCOUNTER — Other Ambulatory Visit: Payer: Self-pay

## 2020-01-06 ENCOUNTER — Ambulatory Visit (INDEPENDENT_AMBULATORY_CARE_PROVIDER_SITE_OTHER): Payer: Self-pay | Admitting: Physical Therapy

## 2020-01-06 DIAGNOSIS — M6281 Muscle weakness (generalized): Secondary | ICD-10-CM

## 2020-01-06 DIAGNOSIS — M545 Low back pain, unspecified: Secondary | ICD-10-CM

## 2020-01-06 DIAGNOSIS — M542 Cervicalgia: Secondary | ICD-10-CM

## 2020-01-06 DIAGNOSIS — M5442 Lumbago with sciatica, left side: Secondary | ICD-10-CM

## 2020-01-06 DIAGNOSIS — R42 Dizziness and giddiness: Secondary | ICD-10-CM

## 2020-01-06 DIAGNOSIS — M5441 Lumbago with sciatica, right side: Secondary | ICD-10-CM

## 2020-01-06 DIAGNOSIS — R262 Difficulty in walking, not elsewhere classified: Secondary | ICD-10-CM

## 2020-01-06 DIAGNOSIS — G8929 Other chronic pain: Secondary | ICD-10-CM
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
## 2020-01-13 ENCOUNTER — Encounter: Payer: Self-pay | Admitting: Physical Therapy

## 2020-01-15 ENCOUNTER — Ambulatory Visit (INDEPENDENT_AMBULATORY_CARE_PROVIDER_SITE_OTHER): Payer: Self-pay | Admitting: Physical Medicine and Rehabilitation

## 2020-01-15 ENCOUNTER — Encounter: Payer: Self-pay | Admitting: Physical Medicine and Rehabilitation

## 2020-01-15 ENCOUNTER — Other Ambulatory Visit: Payer: Self-pay

## 2020-01-15 ENCOUNTER — Ambulatory Visit: Payer: Self-pay

## 2020-01-15 VITALS — BP 117/81 | HR 94

## 2020-01-15 DIAGNOSIS — M5416 Radiculopathy, lumbar region: Secondary | ICD-10-CM

## 2020-01-15 MED ORDER — METHYLPREDNISOLONE ACETATE 80 MG/ML IJ SUSP
40.0000 mg | Freq: Once | INTRAMUSCULAR | Status: AC
  Administered 2020-01-15: 40 mg

## 2020-01-15 NOTE — Procedures (Signed)
Lumbosacral Transforaminal Epidural Steroid Injection - Sub-Pedicular Approach with Fluoroscopic Guidance  Patient: Lindsey Graham      Date of Birth: [birth year] MRN: [id number] PCP: Lavada Mesi, MD      Visit Date: 01/15/2020   Universal Protocol:    Date/Time: 01/15/2020  Consent Given By: the patient  Position: PRONE  Additional Comments: Vital signs were monitored before and after the procedure. Patient was prepped and draped in the usual sterile fashion. The correct patient, procedure, and site was verified.   Injection Procedure Details:  Procedure Site One Meds Administered:  Meds ordered this encounter  Medications  . methylPREDNISolone acetate (DEPO-MEDROL) injection 40 mg    Laterality: Left  Location/Site: Patient has a lumbarized S1 segment that almost appears fully lumbarized as an extra lumbar segment. S1-2  Needle size: 22 G  Needle type: Spinal  Needle Placement: Transforaminal  Findings:    -Comments: Excellent flow of contrast along the nerve and into the epidural space.  Procedure Details: After squaring off the end-plates to get a true AP view, the C-arm was positioned so that an oblique view of the foramen as noted above was visualized. The target area is just inferior to the "nose of the scotty dog" or sub pedicular. The soft tissues overlying this structure were infiltrated with 2-3 ml. of 1% Lidocaine without Epinephrine.  The spinal needle was inserted toward the target using a "trajectory" view along the fluoroscope beam.  Under AP and lateral visualization, the needle was advanced so it did not puncture dura and was located close the 6 O'Clock position of the pedical in AP tracterory. Biplanar projections were used to confirm position. Aspiration was confirmed to be negative for CSF and/or blood. A 1-2 ml. volume of Isovue-250 was injected and flow of contrast was noted at each level. Radiographs were obtained for documentation purposes.    After attaining the desired flow of contrast documented above, a 0.5 to 1.0 ml test dose of 0.25% Marcaine was injected into each respective transforaminal space.  The patient was observed for 90 seconds post injection.  After no sensory deficits were reported, and normal lower extremity motor function was noted,   the above injectate was administered so that equal amounts of the injectate were placed at each foramen (level) into the transforaminal epidural space.   Additional Comments:  The patient tolerated the procedure well Dressing: 2 x 2 sterile gauze and Band-Aid    Post-procedure details: Patient was observed during the procedure. Post-procedure instructions were reviewed.  Patient left the clinic in stable condition.

## 2020-01-15 NOTE — Progress Notes (Signed)
Pt states pain in lower back that radiates into the left leg all the way down.. Pt states last injection is helping some. Pt states no major changes since the last visit.   .Numeric Pain Rating Scale and Functional Assessment Average Pain 8   In the last MONTH (on 0-10 scale) has pain interfered with the following?  1. General activity like being  able to carry out your everyday physical activities such as walking, climbing stairs, carrying groceries, or moving a chair?  Rating(8)   +Driver, -BT, -Dye Allergies.
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
## 2020-01-20 ENCOUNTER — Encounter: Payer: Self-pay | Admitting: Family Medicine

## 2020-01-20 ENCOUNTER — Ambulatory Visit: Payer: Self-pay | Admitting: Family Medicine

## 2020-01-20 ENCOUNTER — Other Ambulatory Visit: Payer: Self-pay

## 2020-01-20 ENCOUNTER — Telehealth: Payer: Self-pay | Admitting: *Deleted

## 2020-01-20 VITALS — BP 120/85 | HR 82 | Ht 66.0 in | Wt 172.0 lb

## 2020-01-20 DIAGNOSIS — G43709 Chronic migraine without aura, not intractable, without status migrainosus: Secondary | ICD-10-CM

## 2020-01-20 DIAGNOSIS — IMO0002 Reserved for concepts with insufficient information to code with codable children: Secondary | ICD-10-CM

## 2020-01-20 DIAGNOSIS — G8929 Other chronic pain: Secondary | ICD-10-CM

## 2020-01-20 DIAGNOSIS — F0781 Postconcussional syndrome: Secondary | ICD-10-CM

## 2020-01-20 DIAGNOSIS — F419 Anxiety disorder, unspecified: Secondary | ICD-10-CM

## 2020-01-20 DIAGNOSIS — F329 Major depressive disorder, single episode, unspecified: Secondary | ICD-10-CM

## 2020-01-20 MED ORDER — EMGALITY 120 MG/ML ~~LOC~~ SOAJ: SUBCUTANEOUS | 11 refills | Status: DC

## 2020-01-20 NOTE — Patient Instructions (Addendum)
We will try to get [employer] covered with patient assistance. Continue gabapentin 300mg  BID. Continue rizatriptan as needed. Continue sertraline 50mg  daily for headache management and anxiety.   Stay well hydrated. Try to stay active.   Follow up in 6-12 months   Migraine Headache A migraine headache is a very strong throbbing pain on one side or both sides of your head. This type of headache can also cause other symptoms. It can last from 4 hours to 3 days. Talk with your doctor about what things may bring on (trigger) this condition. What are the causes? The exact cause of this condition is not known. This condition may be triggered or caused by:  Drinking alcohol.  Smoking.  Taking medicines, such as: ? Medicine used to treat chest pain (nitroglycerin). ? Birth control pills. ? Estrogen. ? Some blood pressure medicines.  Eating or drinking certain products.  Doing physical activity. Other things that may trigger a migraine headache include:  Having a menstrual period.  Pregnancy.  Hunger.  Stress.  Not getting enough sleep or getting too much sleep.  Weather changes.  Tiredness (fatigue). What increases the risk?  Being 25-72 years old.  Being female.  Having a family history of migraine headaches.  Being Caucasian.  Having depression or anxiety.  Being very overweight. What are the signs or symptoms?  A throbbing pain. This pain may: ? Happen in any area of the head, such as on one side or both sides. ? Make it hard to do daily activities. ? Get worse with physical activity. ? Get worse around bright lights or loud noises.  Other symptoms may include: ? Feeling sick to your stomach (nauseous). ? Vomiting. ? Dizziness. ? Being sensitive to bright lights, loud noises, or smells.  Before you get a migraine headache, you may get warning signs (an aura). An aura may include: ? Seeing flashing lights or having blind spots. ? Seeing bright spots,  halos, or zigzag lines. ? Having tunnel vision or blurred vision. ? Having numbness or a tingling feeling. ? Having trouble talking. ? Having weak muscles.  Some people have symptoms after a migraine headache (postdromal phase), such as: ? Tiredness. ? Trouble thinking (concentrating). How is this treated?  Taking medicines that: ? Relieve pain. ? Relieve the feeling of being sick to your stomach. ? Prevent migraine headaches.  Treatment may also include: ? Having acupuncture. ? Avoiding foods that bring on migraine headaches. ? Learning ways to control your body functions (biofeedback). ? Therapy to help you know and deal with negative thoughts (cognitive behavioral therapy). Follow these instructions at home: Medicines  Take over-the-counter and prescription medicines only as told by your doctor.  Ask your doctor if the medicine prescribed to you: ? Requires you to avoid driving or using heavy machinery. ? Can cause trouble pooping (constipation). You may need to take these steps to prevent or treat trouble pooping:  Drink enough fluid to keep your pee (urine) pale yellow.  Take over-the-counter or prescription medicines.  Eat foods that are high in fiber. These include beans, whole grains, and fresh fruits and vegetables.  Limit foods that are high in fat and sugar. These include fried or sweet foods. Lifestyle  Do not drink alcohol.  Do not use any products that contain nicotine or tobacco, such as cigarettes, e-cigarettes, and chewing tobacco. If you need help quitting, ask your doctor.  Get at least 8 hours of sleep every night.  Limit and deal with stress. General instructions  Keep a journal to find out what may bring on your migraine headaches. For example, write down: ? What you eat and drink. ? How much sleep you get. ? Any change in what you eat or drink. ? Any change in your medicines.  If you have a migraine headache: ? Avoid things that  make your symptoms worse, such as bright lights. ? It may help to lie down in a dark, quiet room. ? Do not drive or use heavy machinery. ? Ask your doctor what activities are safe for you.  Keep all follow-up visits as told by your doctor. This is important. Contact a doctor if:  You get a migraine headache that is different or worse than others you have had.  You have more than 15 headache days in one month. Get help right away if:  Your migraine headache gets very bad.  Your migraine headache lasts longer than 72 hours.  You have a fever.  You have a stiff neck.  You have trouble seeing.  Your muscles feel weak or like you cannot control them.  You start to lose your balance a lot.  You start to have trouble walking.  You pass out (faint).  You have a seizure. Summary  A migraine headache is a very strong throbbing pain on one side or both sides of your head. These headaches can also cause other symptoms.  This condition may be treated with medicines and changes to your lifestyle.  Keep a journal to find out what may bring on your migraine headaches.  Contact a doctor if you get a migraine headache that is different or worse than others you have had.  Contact your doctor if you have more than 15 headache days in a month. This information is not intended to replace advice given to you by your health care provider. Make sure you discuss any questions you have with your health care provider. Document Revised: 11/22/2018 Document Reviewed: 09/12/2018 Elsevier Patient Education  2020 [employer].
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
## 2020-01-21 ENCOUNTER — Encounter: Payer: Self-pay | Admitting: Family Medicine

## 2020-01-21 NOTE — Progress Notes (Signed)
I have read the note, and I agree with the clinical assessment and plan.  Garielle Mroz A. Caran Storck, MD, PhD, FAAN Certified in Neurology, Clinical Neurophysiology, Sleep Medicine, Pain Medicine and Neuroimaging  [facility] [address], [address] [place], [place] [address] [phone]  

## 2020-01-21 NOTE — Telephone Encounter (Signed)
PAP for [facility] was faxed today with confirmation received by fax.

## 2020-01-27 ENCOUNTER — Other Ambulatory Visit: Payer: Self-pay

## 2020-01-27 ENCOUNTER — Encounter: Payer: Self-pay | Admitting: Family Medicine

## 2020-01-27 ENCOUNTER — Ambulatory Visit (INDEPENDENT_AMBULATORY_CARE_PROVIDER_SITE_OTHER): Payer: Self-pay | Admitting: Family Medicine

## 2020-01-27 DIAGNOSIS — M79605 Pain in left leg: Secondary | ICD-10-CM

## 2020-01-27 DIAGNOSIS — M7989 Other specified soft tissue disorders: Secondary | ICD-10-CM

## 2020-01-27 DIAGNOSIS — F4310 Post-traumatic stress disorder, unspecified: Secondary | ICD-10-CM

## 2020-01-27 DIAGNOSIS — M545 Low back pain, unspecified: Secondary | ICD-10-CM

## 2020-01-27 DIAGNOSIS — R21 Rash and other nonspecific skin eruption: Secondary | ICD-10-CM

## 2020-01-27 DIAGNOSIS — M542 Cervicalgia: Secondary | ICD-10-CM

## 2020-01-27 DIAGNOSIS — M25571 Pain in right ankle and joints of right foot: Secondary | ICD-10-CM

## 2020-01-27 DIAGNOSIS — G8929 Other chronic pain: Secondary | ICD-10-CM

## 2020-01-27 DIAGNOSIS — M79604 Pain in right leg: Secondary | ICD-10-CM

## 2020-01-27 DIAGNOSIS — F419 Anxiety disorder, unspecified: Secondary | ICD-10-CM

## 2020-01-27 MED ORDER — LORAZEPAM 0.5 MG PO TABS: 0.2500 mg | ORAL_TABLET | Freq: Two times a day (BID) | ORAL | 0 refills | Status: DC | PRN

## 2020-01-27 NOTE — Progress Notes (Signed)
Here to f/u injection with [place]. States she is still having pain and discomfort.
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
## 2020-01-28 ENCOUNTER — Telehealth: Payer: Self-pay | Admitting: Family Medicine

## 2020-01-28 ENCOUNTER — Ambulatory Visit (HOSPITAL_COMMUNITY)
Admission: RE | Admit: 2020-01-28 | Discharge: 2020-01-28 | Disposition: A | Discharge status: Home / Self Care | Payer: Self-pay | Source: Ambulatory Visit | Attending: Family Medicine | Admitting: Family Medicine

## 2020-01-28 DIAGNOSIS — M79605 Pain in left leg: Secondary | ICD-10-CM | POA: Insufficient documentation

## 2020-01-28 DIAGNOSIS — M7989 Other specified soft tissue disorders: Secondary | ICD-10-CM | POA: Insufficient documentation

## 2020-01-28 DIAGNOSIS — M79604 Pain in right leg: Secondary | ICD-10-CM | POA: Insufficient documentation

## 2020-01-28 NOTE — Telephone Encounter (Signed)
Doppler was negative for blood clots.

## 2020-01-28 NOTE — Telephone Encounter (Signed)
I called and advised her of the doppler results

## 2020-01-29 ENCOUNTER — Telehealth: Payer: Self-pay | Admitting: Family Medicine

## 2020-01-29 NOTE — Telephone Encounter (Signed)
Dopplers were negative for blood clots.

## 2020-01-29 NOTE — Telephone Encounter (Signed)
LMOM for patient of the below message  

## 2020-02-02 ENCOUNTER — Telehealth: Payer: Self-pay | Admitting: Dermatology

## 2020-02-02 NOTE — Telephone Encounter (Signed)
Referral from Dr. Prince Rome. No insurance. Scheduled for 05/24/20 @ 2:15 w/ST

## 2020-02-03 NOTE — Telephone Encounter (Signed)
Pt called today and LVM at 3:55 pm to follow up on the [facility] application for [employer]. She stated a dates needs to be changed on the application from 9 to 6/21? Please call back at [phone].

## 2020-02-17 NOTE — Telephone Encounter (Signed)
Pt called needing to speak to RN about the forms that have been sent in for [name] application. Please advise.

## 2020-02-18 NOTE — Telephone Encounter (Signed)
I called and spoke to [place], at [employer].  They need tax return copy that is more clear.  I  LMVM for her with # and fax # to contact them to get them the information they need.  They have everything they need from [place].

## 2020-03-03 NOTE — Telephone Encounter (Signed)
Received approval for [employer] PAP EMGALITY [id number] for 12 month period.  Pt responsible for refill requests,  May opt in for auto refill. [phone].

## 2020-03-18 ENCOUNTER — Telehealth: Payer: Self-pay | Admitting: Family Medicine

## 2020-03-18 NOTE — Telephone Encounter (Signed)
I called pt and she will be starting her emgality. She also mentioned that her MRI cervical and lumbar was done, and per Dr. Prince Rome note 01-29-20 in Epic has results.  She did not know if you wanted to see relating to her sx/headaches and if any think more on our end to let us know.

## 2020-03-18 NOTE — Telephone Encounter (Signed)
Pt called wanting to inform provider that she has started to receive her Galcanezumab-gnlm (EMGALITY) 120 MG/ML SOAJ through [facility]. She also wanted to know if the provider would like to look at the new MRI of the neck that she just had done. Please advise.

## 2020-03-18 NOTE — Telephone Encounter (Signed)
Please let her know that I am very happy she was able to get Emgality. She should take one injection every 30 days. I tried to view cervical MRI results but I do not see anything listed in Epic. I would be happy to look at impression if she can have those results sent to [place].

## 2020-03-18 NOTE — Telephone Encounter (Signed)
I was able to locate his notes and see MRI results. I agree with neurosurgical eval. Has she been seen yet? Cervical pain could be contributing to headaches. Have her keep [place] posted on NS recommendations. TY.

## 2020-03-26 DIAGNOSIS — M542 Cervicalgia: Secondary | ICD-10-CM | POA: Insufficient documentation

## 2020-03-26 DIAGNOSIS — M545 Low back pain, unspecified: Secondary | ICD-10-CM | POA: Insufficient documentation

## 2020-03-26 DIAGNOSIS — G8929 Other chronic pain: Secondary | ICD-10-CM | POA: Insufficient documentation

## 2020-04-09 ENCOUNTER — Ambulatory Visit (INDEPENDENT_AMBULATORY_CARE_PROVIDER_SITE_OTHER): Payer: Self-pay | Admitting: Family Medicine

## 2020-04-09 ENCOUNTER — Encounter: Payer: Self-pay | Admitting: Family Medicine

## 2020-04-09 ENCOUNTER — Other Ambulatory Visit: Payer: Self-pay

## 2020-04-09 DIAGNOSIS — G8929 Other chronic pain: Secondary | ICD-10-CM

## 2020-04-09 DIAGNOSIS — M25571 Pain in right ankle and joints of right foot: Secondary | ICD-10-CM

## 2020-04-09 DIAGNOSIS — F419 Anxiety disorder, unspecified: Secondary | ICD-10-CM

## 2020-04-09 DIAGNOSIS — F4310 Post-traumatic stress disorder, unspecified: Secondary | ICD-10-CM

## 2020-04-09 DIAGNOSIS — H00012 Hordeolum externum right lower eyelid: Secondary | ICD-10-CM

## 2020-04-09 DIAGNOSIS — M545 Low back pain, unspecified: Secondary | ICD-10-CM

## 2020-04-09 DIAGNOSIS — M542 Cervicalgia: Secondary | ICD-10-CM

## 2020-04-09 MED ORDER — SERTRALINE HCL 50 MG PO TABS: 75.0000 mg | ORAL_TABLET | Freq: Every day | ORAL | 3 refills | Status: DC

## 2020-04-09 MED ORDER — ERYTHROMYCIN 5 MG/GM OP OINT: 1.0000 "application " | TOPICAL_OINTMENT | Freq: Three times a day (TID) | OPHTHALMIC | 0 refills | Status: DC
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
## 2020-04-11 ENCOUNTER — Ambulatory Visit
Admission: RE | Admit: 2020-04-11 | Discharge: 2020-04-11 | Disposition: A | Discharge status: Home / Self Care | Payer: Self-pay | Source: Ambulatory Visit | Attending: Family Medicine | Admitting: Family Medicine

## 2020-04-11 DIAGNOSIS — M25571 Pain in right ankle and joints of right foot: Secondary | ICD-10-CM

## 2020-04-12 ENCOUNTER — Telehealth: Payer: Self-pay | Admitting: Family Medicine

## 2020-04-12 NOTE — Telephone Encounter (Signed)
Ankle MRI is essentially normal.  No ligament damage, no arthritis, no fractures.  No clear-cut indication for surgery.

## 2020-04-14 ENCOUNTER — Telehealth: Payer: Self-pay | Admitting: Specialist

## 2020-04-14 NOTE — Telephone Encounter (Signed)
Called back and is sched for 06/03/20 with Dr. Otelia Sergeant, I have put her on the cancellation list for sooner appt.

## 2020-04-14 NOTE — Telephone Encounter (Signed)
Patient called. Says she missed a call from [place] to schedule with Dr. Otelia Sergeant. would like a call back.

## 2020-04-23 ENCOUNTER — Telehealth: Payer: Self-pay | Admitting: Family Medicine

## 2020-04-23 ENCOUNTER — Encounter: Payer: Self-pay | Admitting: Family Medicine

## 2020-04-23 NOTE — Telephone Encounter (Signed)
Lindsey Graham's pain is manageable at this point.  Since her ankle MRI looks ok, I think it would be fine for her to resume working as tolerated.

## 2020-04-28 ENCOUNTER — Ambulatory Visit: Payer: Self-pay | Admitting: Family Medicine

## 2020-05-10 ENCOUNTER — Encounter: Payer: Self-pay | Admitting: Radiology

## 2020-05-24 ENCOUNTER — Ambulatory Visit: Payer: Self-pay | Admitting: Dermatology

## 2020-06-03 ENCOUNTER — Encounter: Payer: Self-pay | Admitting: Specialist

## 2020-06-03 ENCOUNTER — Ambulatory Visit: Payer: Self-pay

## 2020-06-03 ENCOUNTER — Other Ambulatory Visit: Payer: Self-pay

## 2020-06-03 ENCOUNTER — Ambulatory Visit (INDEPENDENT_AMBULATORY_CARE_PROVIDER_SITE_OTHER): Payer: Self-pay | Admitting: Specialist

## 2020-06-03 VITALS — BP 127/88 | HR 87 | Ht 66.0 in | Wt 172.0 lb

## 2020-06-03 DIAGNOSIS — M5010 Cervical disc disorder with radiculopathy, unspecified cervical region: Secondary | ICD-10-CM

## 2020-06-03 DIAGNOSIS — G8929 Other chronic pain: Secondary | ICD-10-CM

## 2020-06-03 DIAGNOSIS — M545 Low back pain, unspecified: Secondary | ICD-10-CM

## 2020-06-03 DIAGNOSIS — M5136 Other intervertebral disc degeneration, lumbar region: Secondary | ICD-10-CM

## 2020-06-03 MED ORDER — DICLOFENAC-MISOPROSTOL 50-0.2 MG PO TBEC: 1.0000 | DELAYED_RELEASE_TABLET | Freq: Two times a day (BID) | ORAL | 1 refills | Status: DC
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
## 2020-06-04 ENCOUNTER — Telehealth: Payer: Self-pay | Admitting: Family Medicine

## 2020-06-04 ENCOUNTER — Encounter: Payer: Self-pay | Admitting: Family Medicine

## 2020-06-04 NOTE — Telephone Encounter (Signed)
I called the patient. She said Dr. Otelia Sergeant commented on the portion of the chest that could be seen on her ap view Csp yesterday, that he was going to have you look at it also.

## 2020-06-04 NOTE — Telephone Encounter (Signed)
Patient called. Would like to know if she needs an antibiotic. She was seen yesterday by Dr. Otelia Sergeant. Says Dr. Otelia Sergeant was going to talk with Dr. Prince Rome about her X-rays. Her call back number is [phone]

## 2020-06-04 NOTE — Telephone Encounter (Signed)
Dr. Barbaraann Faster note doesn't mention anything about an antibiotic, so I don't think she needs one.

## 2020-06-04 NOTE — Telephone Encounter (Signed)
Responded by MyChart

## 2020-06-04 NOTE — Telephone Encounter (Signed)
Please advise 

## 2020-06-10 ENCOUNTER — Telehealth: Payer: Self-pay | Admitting: Neurology

## 2020-06-10 NOTE — Telephone Encounter (Signed)
Records faxed to [facility] at [phone] on 06/10/2020.

## 2020-06-30 ENCOUNTER — Ambulatory Visit
Admission: RE | Admit: 2020-06-30 | Discharge: 2020-06-30 | Disposition: A | Discharge status: Home / Self Care | Payer: Self-pay | Source: Ambulatory Visit | Attending: Specialist | Admitting: Specialist

## 2020-06-30 ENCOUNTER — Other Ambulatory Visit: Payer: Self-pay

## 2020-06-30 DIAGNOSIS — M5010 Cervical disc disorder with radiculopathy, unspecified cervical region: Secondary | ICD-10-CM

## 2020-07-01 ENCOUNTER — Ambulatory Visit
Admission: RE | Admit: 2020-07-01 | Discharge: 2020-07-01 | Disposition: A | Discharge status: Home / Self Care | Payer: Self-pay | Source: Ambulatory Visit | Attending: Specialist | Admitting: Specialist

## 2020-07-01 ENCOUNTER — Other Ambulatory Visit: Payer: Self-pay

## 2020-07-01 DIAGNOSIS — G8929 Other chronic pain: Secondary | ICD-10-CM

## 2020-07-01 DIAGNOSIS — M5136 Other intervertebral disc degeneration, lumbar region: Secondary | ICD-10-CM

## 2020-07-01 DIAGNOSIS — M545 Low back pain, unspecified: Secondary | ICD-10-CM

## 2020-07-05 ENCOUNTER — Ambulatory Visit (INDEPENDENT_AMBULATORY_CARE_PROVIDER_SITE_OTHER): Payer: Self-pay | Admitting: Specialist

## 2020-07-05 ENCOUNTER — Encounter: Payer: Self-pay | Admitting: Specialist

## 2020-07-05 ENCOUNTER — Other Ambulatory Visit: Payer: Self-pay

## 2020-07-05 VITALS — BP 108/74 | HR 87 | Ht 66.0 in | Wt 172.0 lb

## 2020-07-05 DIAGNOSIS — M5010 Cervical disc disorder with radiculopathy, unspecified cervical region: Secondary | ICD-10-CM

## 2020-07-05 DIAGNOSIS — M542 Cervicalgia: Secondary | ICD-10-CM

## 2020-07-05 DIAGNOSIS — M79605 Pain in left leg: Secondary | ICD-10-CM

## 2020-07-05 DIAGNOSIS — G8929 Other chronic pain: Secondary | ICD-10-CM

## 2020-07-05 DIAGNOSIS — M545 Low back pain, unspecified: Secondary | ICD-10-CM

## 2020-07-05 DIAGNOSIS — M79604 Pain in right leg: Secondary | ICD-10-CM

## 2020-07-05 DIAGNOSIS — M5136 Other intervertebral disc degeneration, lumbar region: Secondary | ICD-10-CM

## 2020-07-05 NOTE — Patient Instructions (Addendum)
Avoid frequent bending and stooping  No lifting greater than 15 lbs. May use ice or moist heat for pain. Weight loss is of benefit. Best medication for lumbar disc disease is arthritis medications like motrin, celebrex and naprosyn. Exercise is important to improve your indurance and does allow people to function better inspite of back pain.  Avoid overhead lifting and overhead use of the arms. Do not lift greater than 5 lbs. Adjust head rest in vehicle to prevent hyperextension if rear ended. Take extra precautions to avoid falling. Change to gabapentin 200 mg qhs. Tizanidine for cervical spasm . Handicap license is approved. Dr. [place] Blas secretary/Assistant will call to arrange for selective nerve root blocks left C5-6 and right C6-7 And central ESI L4-5 with transition level S1-S2, lowest disc is S1-2
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
## 2020-07-20 ENCOUNTER — Ambulatory Visit: Payer: Self-pay | Admitting: Family Medicine

## 2020-07-28 ENCOUNTER — Other Ambulatory Visit: Payer: Self-pay | Admitting: Specialist

## 2020-07-28 ENCOUNTER — Telehealth: Payer: Self-pay | Admitting: Physical Medicine and Rehabilitation

## 2020-07-28 NOTE — Telephone Encounter (Signed)
Pt called a states that it is the right side of your neck. Pt just wanted to clarify that she said the right side instead of the left.

## 2020-07-28 NOTE — Telephone Encounter (Signed)
Called pt back and verify appt change.

## 2020-08-02 ENCOUNTER — Encounter: Payer: Self-pay | Admitting: Physical Medicine and Rehabilitation

## 2020-08-02 ENCOUNTER — Ambulatory Visit (INDEPENDENT_AMBULATORY_CARE_PROVIDER_SITE_OTHER): Payer: Self-pay | Admitting: Physical Medicine and Rehabilitation

## 2020-08-02 ENCOUNTER — Other Ambulatory Visit: Payer: Self-pay

## 2020-08-02 ENCOUNTER — Ambulatory Visit: Payer: Self-pay

## 2020-08-02 VITALS — BP 103/72 | HR 78

## 2020-08-02 DIAGNOSIS — M5412 Radiculopathy, cervical region: Secondary | ICD-10-CM

## 2020-08-02 MED ORDER — BUPIVACAINE HCL 0.25 % IJ SOLN
2.0000 mL | Freq: Once | INTRAMUSCULAR | Status: AC
  Administered 2020-08-02: 15:00:00 2 mL

## 2020-08-02 MED ORDER — DEXAMETHASONE SODIUM PHOSPHATE 10 MG/ML IJ SOLN
15.0000 mg | Freq: Once | INTRAMUSCULAR | Status: AC
  Administered 2020-08-02: 15:00:00 15 mg
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
## 2020-08-09 ENCOUNTER — Encounter: Payer: Self-pay | Admitting: Specialist

## 2020-08-09 ENCOUNTER — Ambulatory Visit (INDEPENDENT_AMBULATORY_CARE_PROVIDER_SITE_OTHER): Payer: Self-pay | Admitting: Specialist

## 2020-08-09 ENCOUNTER — Other Ambulatory Visit: Payer: Self-pay

## 2020-08-09 VITALS — BP 114/79 | HR 86 | Ht 66.0 in | Wt 172.0 lb

## 2020-08-09 DIAGNOSIS — M5010 Cervical disc disorder with radiculopathy, unspecified cervical region: Secondary | ICD-10-CM

## 2020-08-09 DIAGNOSIS — M542 Cervicalgia: Secondary | ICD-10-CM

## 2020-08-09 MED ORDER — GABAPENTIN 100 MG PO CAPS: ORAL_CAPSULE | ORAL | 3 refills | Status: DC

## 2020-08-09 NOTE — Patient Instructions (Addendum)
Avoid overhead lifting and overhead use of the arms. Do not lift greater than 5 lbs. Adjust head rest in vehicle to prevent hyperextension if rear ended. Take extra precautions to avoid falling. Referral to Dr. Alvester Morin for left C5-6 or C6 TF nerve root block to assess for relief of pain associated with this nerve.  Consider arthroplasty at C5-6 and posterior cervical foraminotomy right C6-7 is she is able to see improvement on the left side with selective nrb left C6.
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
## 2020-08-12 NOTE — Procedures (Signed)
Cervical Transforaminal Selective Nerve Root Block with Fluoroscopic Guidance   Patient: Lindsey Graham      Date of Birth: [birth year] MRN: [id number] PCP: Lavada Mesi, MD      Visit Date: 08/02/2020   Universal Protocol:    Date/Time: 12/30/219:40 AM  Consent Given By: the patient  Position: SUPINE  Additional Comments: Vital signs were monitored before and after the procedure. Patient was prepped and draped in the usual sterile fashion. The correct patient, procedure, and site was verified.   Injection Procedure Details:  Procedure Site One Meds Administered:  Meds ordered this encounter  Medications  . dexamethasone (DECADRON) injection 15 mg  . bupivacaine (MARCAINE) 0.25 % (with pres) injection 2 mL     Laterality: Right  Location/Site:  C6-7  Needle size: 25 G  Needle type: spinal needle  Needle Placement: Just at the determined foramen  Findings:    -Comments: Excellent flow of contrast along the nerve and into the epidural space.  Procedure Details: The C-arm was obliqued to the ipsilateral side of the patient to about 45 from AP.  The C-arm was then tilted to square off the inferior endplate of the level targeted.  C-arm obliquity was then adjusted to maximize the size of the foramen.  The skin over the targeted area which was the posterior inferior portion of the foramen was then anesthetized with a small amount of 1% lidocaine without epinephrine.  A 25-gauge 2-1/2 inch spinal needle was then introduced through the skin and down to the targeted area under biplanar fluoroscopic guidance.  Multiple AP and lateral/oblique views were monitored during needle manipulation.  Final placement was with the needle just outside of the 6 o'clock position on the AP view.  After negative aspirate for air, blood, and CSF, a 1 ml. volume of Isovue-250 was injected into the foramen and the flow of contrast was observed. Radiographs were obtained for documentation purposes.    When there was no arterial or venous flow of contrast, the injectate was administered into the level noted above.     Additional Comments:  The patient tolerated the procedure well Dressing: 2 x 2 sterile gauze with Band-Aid    Post-procedure details: Patient was observed during the procedure. Post-procedure instructions were reviewed.  Patient left the clinic in stable condition.

## 2020-08-12 NOTE — Progress Notes (Signed)
Lindsey Graham - 44 y.o. female MRN [id number]  Date of birth: 12-21-1975  Office Visit Note: Visit Date: 08/02/2020 PCP: Lavada Mesi, MD Referred by: Lavada Mesi, MD  Subjective: Chief Complaint  Patient presents with  . Neck - Pain  . Right Shoulder - Pain  . Right Elbow - Pain  . Right Arm - Pain, Numbness   HPI:  Lindsey Graham is a 44 y.o. female who comes in today at the request of Dr. [name] for planned Right C6-7 Cervical transforaminal epidural steroid injection with fluoroscopic guidance.  The patient has failed conservative care including home exercise, medications, time and activity modification.  This injection will be diagnostic and hopefully therapeutic.  Please see requesting physician notes for further details and justification.  MRI reviewed with images and spine model.  MRI reviewed in the note below.   ROS Otherwise per HPI.  Assessment & Plan: Visit Diagnoses:    ICD-10-CM   1. Cervical radiculopathy  M54.12 XR C-ARM NO REPORT    Nerve Block    dexamethasone (DECADRON) injection 15 mg    bupivacaine (MARCAINE) 0.25 % (with pres) injection 2 mL    Plan: No additional findings.   Meds & Orders:  Meds ordered this encounter  Medications  . dexamethasone (DECADRON) injection 15 mg  . bupivacaine (MARCAINE) 0.25 % (with pres) injection 2 mL    Orders Placed This Encounter  Procedures  . Nerve Block  . XR C-ARM NO REPORT    Follow-up: Return for visit to requesting physician as needed.   Procedures: No procedures performed  Cervical Transforaminal Selective Nerve Root Block with Fluoroscopic Guidance   Patient: Lindsey Graham      Date of Birth: 07/17/1976 MRN: [id number] PCP: Lavada Mesi, MD      Visit Date: 08/02/2020   Universal Protocol:    Date/Time: 12/30/219:40 AM  Consent Given By: the patient  Position: SUPINE  Additional Comments: Vital signs were monitored before and after the procedure. Patient was prepped and draped in the  usual sterile fashion. The correct patient, procedure, and site was verified.   Injection Procedure Details:  Procedure Site One Meds Administered:  Meds ordered this encounter  Medications  . dexamethasone (DECADRON) injection 15 mg  . bupivacaine (MARCAINE) 0.25 % (with pres) injection 2 mL     Laterality: Right  Location/Site:  C6-7  Needle size: 25 G  Needle type: spinal needle  Needle Placement: Just at the determined foramen  Findings:    -Comments: Excellent flow of contrast along the nerve and into the epidural space.  Procedure Details: The C-arm was obliqued to the ipsilateral side of the patient to about 45 from AP.  The C-arm was then tilted to square off the inferior endplate of the level targeted.  C-arm obliquity was then adjusted to maximize the size of the foramen.  The skin over the targeted area which was the posterior inferior portion of the foramen was then anesthetized with a small amount of 1% lidocaine without epinephrine.  A 25-gauge 2-1/2 inch spinal needle was then introduced through the skin and down to the targeted area under biplanar fluoroscopic guidance.  Multiple AP and lateral/oblique views were monitored during needle manipulation.  Final placement was with the needle just outside of the 6 o'clock position on the AP view.  After negative aspirate for air, blood, and CSF, a 1 ml. volume of Isovue-250 was injected into the foramen and the flow of contrast was observed. Radiographs were obtained  for documentation purposes.   When there was no arterial or venous flow of contrast, the injectate was administered into the level noted above.     Additional Comments:  The patient tolerated the procedure well Dressing: 2 x 2 sterile gauze with Band-Aid    Post-procedure details: Patient was observed during the procedure. Post-procedure instructions were reviewed.  Patient left the clinic in stable condition.      Clinical History: MRI  CERVICAL SPINE WITHOUT CONTRAST  TECHNIQUE: Multiplanar, multisequence MR imaging of the cervical spine was performed. No intravenous contrast was administered.  COMPARISON:  Prior MRI from 11/19/2019.  FINDINGS: Alignment: Straightening of the normal cervical lordosis. No listhesis or static subluxation.  Vertebrae: Vertebral body height maintained without acute or chronic fracture. Bone marrow signal intensity within normal limits. Few scattered benign hemangiomata noted. No worrisome osseous lesions. No abnormal marrow edema.  Cord: Normal signal and morphology.  Posterior Fossa, vertebral arteries, paraspinal tissues: Cerebellar tonsils are low lying protruding up to 6-7 mm through the foramen magnum, suggesting a Chiari 1 malformation. Visualized brain otherwise unremarkable. Craniocervical junction within normal limits. Paraspinous and prevertebral soft tissues are normal. Normal flow voids seen within the vertebral arteries bilaterally.  Disc levels:  C2-C3: Unremarkable.  C3-C4: Mild disc bulge with uncovertebral spurring. Flattening and partial effacement of the ventral thecal sac without significant stenosis or cord deformity. Previously seen small right paracentral to foraminal protrusion has largely regressed. Foramina remain patent.  C4-C5: Broad left paracentral to foraminal disc osteophyte complex indents and partially effaces the ventral thecal sac. Mild flattening of the left ventral cord without cord signal changes. This is also slightly regressed from previous and is decreased in size. Residual mild spinal stenosis. Foramina remain patent.  C5-C6: Chronic intervertebral disc space narrowing with diffuse disc osteophyte complex, asymmetric to the left. Broad posterior component flattens and partially effaces the ventral thecal sac with resultant mild spinal stenosis, greater on the left. No significant cord deformity. Left greater than right  uncinate spurring with moderate left C6 foraminal narrowing. Right neural foramina remains patent. Appearance is stable from previous.  C6-C7: Persistent subtle right paracentral disc protrusion indents the right ventral thecal sac (series 9, image 24). No significant spinal stenosis or cord deformity, although again the ventral right C7 nerve root could be affected. Size of this protrusion is stable to perhaps slightly decreased from prior. Foramina remain widely patent.  C7-T1: Negative interspace. Mild left-sided facet hypertrophy. No canal or foraminal stenosis.  Visualized upper thoracic spine demonstrates no new significant finding.  IMPRESSION: 1. Persistent small right paracentral disc protrusion at C6-7, potentially affecting the ventral right C7 nerve root. Overall, size of this protrusion is stable to perhaps slightly decreased from prior. 2. Left paracentral to foraminal disc osteophyte complex at C5-6 with resultant mild canal and moderate left C6 foraminal stenosis, stable. 3. Partial interval regression of previously seen small right paracentral to foraminal disc protrusion at C3-4 as well as left-sided protrusion at C4-5. 4. Cerebellar tonsils are low lying extending up to 7 mm through the foramen magnum, suggesting a Chiari 1 malformation.   Electronically Signed   By: Rise Mu M.D.   On: 07/01/2020 04:39     Objective:  VS:  HT:    WT:   BMI:     BP:103/72  HR:78bpm  TEMP: ( )  RESP:  Physical Exam Vitals and nursing note reviewed.  Constitutional:      General: She is not in acute distress.  Appearance: Normal appearance. She is not ill-appearing.  HENT:     Head: Normocephalic and atraumatic.     Right Ear: External ear normal.     Left Ear: External ear normal.  Eyes:     Extraocular Movements: Extraocular movements intact.  Cardiovascular:     Rate and Rhythm: Normal rate.     Pulses: Normal pulses.  Musculoskeletal:      Cervical back: Tenderness present. No rigidity.     Right lower leg: No edema.     Left lower leg: No edema.     Comments: Patient has good strength in the upper extremities including 5 out of 5 strength in wrist extension long finger flexion and APB.  There is no atrophy of the hands intrinsically.  There is a negative Hoffmann's test.   Lymphadenopathy:     Cervical: No cervical adenopathy.  Skin:    Findings: No erythema, lesion or rash.  Neurological:     General: No focal deficit present.     Mental Status: She is alert and oriented to person, place, and time.     Sensory: No sensory deficit.     Motor: No weakness or abnormal muscle tone.     Coordination: Coordination normal.  Psychiatric:        Mood and Affect: Mood normal.        Behavior: Behavior normal.      Imaging: No results found.

## 2020-08-30 ENCOUNTER — Other Ambulatory Visit: Payer: Self-pay | Admitting: Family Medicine

## 2020-09-08 ENCOUNTER — Other Ambulatory Visit: Payer: Self-pay

## 2020-09-08 ENCOUNTER — Ambulatory Visit: Payer: Self-pay

## 2020-09-08 ENCOUNTER — Ambulatory Visit (INDEPENDENT_AMBULATORY_CARE_PROVIDER_SITE_OTHER): Payer: Self-pay | Admitting: Physical Medicine and Rehabilitation

## 2020-09-08 ENCOUNTER — Encounter: Payer: Self-pay | Admitting: Physical Medicine and Rehabilitation

## 2020-09-08 VITALS — BP 111/80 | HR 83

## 2020-09-08 DIAGNOSIS — M5412 Radiculopathy, cervical region: Secondary | ICD-10-CM

## 2020-09-08 MED ORDER — DEXAMETHASONE SODIUM PHOSPHATE 10 MG/ML IJ SOLN
15.0000 mg | Freq: Once | INTRAMUSCULAR | Status: AC
  Administered 2020-09-08: 15 mg

## 2020-09-08 NOTE — Progress Notes (Signed)
Pt state neck and to both shoulder. Pt state turning her neck makes the pain worse. Pt state Pt has hx of inj on 08/02/20 pt state it worked.  Numeric Pain Rating Scale and Functional Assessment Average Pain 5   In the last MONTH (on 0-10 scale) has pain interfered with the following?  1. General activity like being  able to carry out your everyday physical activities such as walking, climbing stairs, carrying groceries, or moving a chair?  Rating(10)   +Driver, -BT, -Dye Allergies.

## 2020-09-08 NOTE — Patient Instructions (Signed)

## 2020-09-16 ENCOUNTER — Ambulatory Visit (INDEPENDENT_AMBULATORY_CARE_PROVIDER_SITE_OTHER): Payer: Self-pay | Admitting: Specialist

## 2020-09-16 ENCOUNTER — Encounter: Payer: Self-pay | Admitting: Specialist

## 2020-09-16 ENCOUNTER — Other Ambulatory Visit: Payer: Self-pay

## 2020-09-16 VITALS — BP 109/79 | HR 80 | Ht 66.0 in | Wt 172.0 lb

## 2020-09-16 DIAGNOSIS — M5010 Cervical disc disorder with radiculopathy, unspecified cervical region: Secondary | ICD-10-CM

## 2020-09-16 DIAGNOSIS — M542 Cervicalgia: Secondary | ICD-10-CM

## 2020-09-16 NOTE — Patient Instructions (Addendum)
Plan: Avoid overhead lifting and overhead use of the arms. Do not lift greater than 5 lbs. Adjust head rest in vehicle to prevent hyperextension if rear ended. Take extra precautions to avoid falling, including use of a cane if you feel weak. Scheduling secretary Tivis Ringer will call you to arrange for surgery for your cervical spine.  If you have worsening arm or leg numbness or weakness please call or go to an ER. We will contact your cardiologist and primary care physicians to seek clearance for your surgery. Surgery will be total disc arthroplasty at the C5-6 level with metal and polyethylene ,  Risks of surgery include risks of infection, bleeding and risks to the spinal cord and  Risks of sore throat and difficulty swallowing which should  Improve over the next 4-6 weeks following surgery. Surgery is indicated due to upper extremity radiculopathy. In the future surgery at adjacent levels may be necessary but these levels do not appear to be related to your current symptoms or signs. I need to see Dr. Chelyan Blas note form 09/08/2020

## 2020-09-16 NOTE — Progress Notes (Signed)
Office Visit Note   Patient: Lindsey Graham           Date of Birth: Jan 20, 1976           MRN: [id number] Visit Date: 09/16/2020              Requested by: Lavada Mesi, MD [address] [place],  [place] [address] PCP: Lavada Mesi, MD   Assessment & Plan: Visit Diagnoses:  1. Herniation of cervical intervertebral disc with radiculopathy   2. Cervicalgia    Plan: Avoid overhead lifting and overhead use of the arms. Do not lift greater than 5 lbs. Adjust head rest in vehicle to prevent hyperextension if rear ended. Take extra precautions to avoid falling, including use of a cane if you feel weak. Scheduling secretary Tivis Ringer will call you to arrange for surgery for your cervical spine. If you wish a second opinion please let us know and we can arrange for you. If you have worsening arm or leg numbness or weakness please call or go to an ER. We will contact your cardiologist and primary care physicians to seek clearance for your surgery. Surgery will be total disc arthroplasty at the C5-6 level with metal and polyethylene ,  Risks of surgery include risks of infection, bleeding and risks to the spinal cord and  Risks of sore throat and difficulty swallowing which should  Improve over the next 4-6 weeks following surgery. Surgery is indicated due to upper extremity radiculopathy. In the future surgery at adjacent levels may be necessary but these levels do not appear to be related to your current symptoms or signs. I need to see Dr. Sulphur Blas note form 1/26/2022Follow-Up Instructions: No follow-ups on file.   Orders:  No orders of the defined types were placed in this encounter.  No orders of the defined types were placed in this encounter.     Procedures: No procedures performed   Clinical Data: No additional findings.   Subjective: Chief Complaint  Patient presents with  . Neck - Follow-up    Had Left C5-6 TF injection with Dr. Alvester Morin and states she got  relief from the injection    HPI  Review of Systems  Constitutional: Negative.   HENT: Negative.   Eyes: Negative.   Respiratory: Negative.   Cardiovascular: Negative.   Gastrointestinal: Negative.   Endocrine: Negative.   Genitourinary: Negative.   Musculoskeletal: Negative.   Skin: Negative.   Allergic/Immunologic: Negative.   Neurological: Negative.   Hematological: Negative.   Psychiatric/Behavioral: Negative.      Objective: Vital Signs: BP 109/79 (BP Location: Left Arm, Patient Position: Sitting)   Pulse 80   Ht 5\' 6"  (1.676 m)   Wt 172 lb (78 kg)   BMI 27.76 kg/m   Physical Exam Constitutional:      Appearance: She is well-developed and well-nourished.  HENT:     Head: Normocephalic and atraumatic.  Eyes:     Extraocular Movements: EOM normal.     Pupils: Pupils are equal, round, and reactive to light.  Pulmonary:     Effort: Pulmonary effort is normal.     Breath sounds: Normal breath sounds.  Abdominal:     General: Bowel sounds are normal.     Palpations: Abdomen is soft.  Musculoskeletal:     Cervical back: Normal range of motion and neck supple.  Skin:    General: Skin is warm and dry.  Neurological:     Mental Status: She is alert and oriented to  person, place, and time.  Psychiatric:        Mood and Affect: Mood and affect normal.        Behavior: Behavior normal.        Thought Content: Thought content normal.        Judgment: Judgment normal.     Back Exam   Tenderness  The patient is experiencing tenderness in the cervical.  Range of Motion  Extension:  70 abnormal  Flexion:  60 abnormal  Lateral bend right: abnormal  Lateral bend left: abnormal  Rotation right: abnormal  Rotation left: abnormal   Reflexes  Patellar: 2/4 Achilles: 2/4 Biceps: 2/4      Specialty Comments:  No specialty comments available.  Imaging: No results found.   PMFS History: Patient Active Problem List   Diagnosis Date Noted  . Nodule of  upper lobe of right lung 10/08/2019  . Head injury 06/04/2019  . Post concussion syndrome 06/04/2019  . Chronic migraine 06/04/2019  . Back pain 06/04/2019   No past medical history on file.  Family History  Problem Relation Age of Onset  . COPD Father     No past surgical history on file. Social History   Occupational History  . Not on file  Tobacco Use  . Smoking status: Former Smoker    Types: E-cigarettes  . Smokeless tobacco: Current User  Vaping Use  . Vaping Use: Every day  Substance and Sexual Activity  . Alcohol use: Never  . Drug use: Never  . Sexual activity: Yes

## 2020-09-22 ENCOUNTER — Ambulatory Visit: Payer: Self-pay | Admitting: Dermatology

## 2020-09-29 ENCOUNTER — Other Ambulatory Visit: Payer: Self-pay | Admitting: Family Medicine

## 2020-09-29 ENCOUNTER — Other Ambulatory Visit: Payer: Self-pay | Admitting: Specialist

## 2020-09-29 MED ORDER — DICLOFENAC-MISOPROSTOL 50-0.2 MG PO TBEC: 1.0000 | DELAYED_RELEASE_TABLET | Freq: Two times a day (BID) | ORAL | 1 refills | Status: DC

## 2020-09-29 NOTE — Telephone Encounter (Signed)
Please advise on Rx

## 2020-10-04 ENCOUNTER — Telehealth: Payer: Self-pay

## 2020-10-04 NOTE — Telephone Encounter (Signed)
Attorney Hinda Lenis with [employer] would like to know if a letter could be given for insurance claim.  Cb# [phone].  Please advise.  Thank you.

## 2020-10-05 NOTE — Telephone Encounter (Signed)
Lmom for Lindsey Graham to call me back, adivsed that I need a little more info as far as the "insurance claim"--re: what it is regarding.

## 2020-10-06 NOTE — Progress Notes (Signed)
[name] - 45 y.o. female MRN [id number]  Date of birth: [birth year]  Office Visit Note: Visit Date: 09/08/2020 PCP: Lavada Mesi, MD Referred by: Lavada Mesi, MD  Subjective: Chief Complaint  Patient presents with  . Neck - Pain  . Right Shoulder - Pain  . Left Shoulder - Pain   HPI:  Lindsey Graham is a 45 y.o. female who comes in today at the request of Dr. [name] for planned Left C5-6 Cervical epidural steroid injection with fluoroscopic guidance.  The patient has failed conservative care including home exercise, medications, time and activity modification.  This injection will be diagnostic and hopefully therapeutic.  Please see requesting physician notes for further details and justification.   ROS Otherwise per HPI.  Assessment & Plan: Visit Diagnoses:    ICD-10-CM   1. Cervical radiculopathy  M54.12 XR C-ARM NO REPORT    Epidural Steroid injection    dexamethasone (DECADRON) injection 15 mg    Plan: No additional findings.   Meds & Orders:  Meds ordered this encounter  Medications  . dexamethasone (DECADRON) injection 15 mg    Orders Placed This Encounter  Procedures  . XR C-ARM NO REPORT  . Epidural Steroid injection    Follow-up: Return for visit to requesting physician as needed.   Procedures: No procedures performed  Cervical Transforaminal Selective Nerve Root Block with Fluoroscopic Guidance   Patient: Lindsey Graham      Date of Birth: [birth year] MRN: [id number] PCP: Lavada Mesi, MD      Visit Date: 09/08/2020   Universal Protocol:    Date/Time: 02/23/229:57 AM  Consent Given By: the patient  Position: SUPINE  Additional Comments: Vital signs were monitored before and after the procedure. Patient was prepped and draped in the usual sterile fashion. The correct patient, procedure, and site was verified.   Injection Procedure Details:  Procedure Site One Meds Administered:  Meds ordered this encounter  Medications  . dexamethasone  (DECADRON) injection 15 mg     Laterality: Left  Location/Site:  Left C6 SNRB C5-6  Needle size: 25 G  Needle type: spinal needle  Needle Placement: Just at the determined foramen  Findings:    -Comments: Excellent flow of contrast along the nerve and into the epidural space.  Procedure Details: The C-arm was obliqued to the ipsilateral side of the patient to about 45 from AP.  The C-arm was then tilted to square off the inferior endplate of the level targeted.  C-arm obliquity was then adjusted to maximize the size of the foramen.  The skin over the targeted area which was the posterior inferior portion of the foramen was then anesthetized with a small amount of 1% lidocaine without epinephrine.  A 25-gauge 2-1/2 inch spinal needle was then introduced through the skin and down to the targeted area under biplanar fluoroscopic guidance.  Multiple AP and lateral/oblique views were monitored during needle manipulation.  Final placement was with the needle just outside of the 6 o'clock position on the AP view.  After negative aspirate for air, blood, and CSF, a 1 ml. volume of Isovue-250 was injected into the foramen and the flow of contrast was observed. Radiographs were obtained for documentation purposes.   When there was no arterial or venous flow of contrast, the injectate was administered into the level noted above.     Additional Comments:  The patient tolerated the procedure well Dressing: 2 x 2 sterile gauze with Band-Aid    Post-procedure details: Patient was  observed during the procedure. Post-procedure instructions were reviewed.  Patient left the clinic in stable condition.       Clinical History: MRI CERVICAL SPINE WITHOUT CONTRAST  TECHNIQUE: Multiplanar, multisequence MR imaging of the cervical spine was performed. No intravenous contrast was administered.  COMPARISON:  Prior MRI from 11/19/2019.  FINDINGS: Alignment: Straightening of the normal  cervical lordosis. No listhesis or static subluxation.  Vertebrae: Vertebral body height maintained without acute or chronic fracture. Bone marrow signal intensity within normal limits. Few scattered benign hemangiomata noted. No worrisome osseous lesions. No abnormal marrow edema.  Cord: Normal signal and morphology.  Posterior Fossa, vertebral arteries, paraspinal tissues: Cerebellar tonsils are low lying protruding up to 6-7 mm through the foramen magnum, suggesting a Chiari 1 malformation. Visualized brain otherwise unremarkable. Craniocervical junction within normal limits. Paraspinous and prevertebral soft tissues are normal. Normal flow voids seen within the vertebral arteries bilaterally.  Disc levels:  C2-C3: Unremarkable.  C3-C4: Mild disc bulge with uncovertebral spurring. Flattening and partial effacement of the ventral thecal sac without significant stenosis or cord deformity. Previously seen small right paracentral to foraminal protrusion has largely regressed. Foramina remain patent.  C4-C5: Broad left paracentral to foraminal disc osteophyte complex indents and partially effaces the ventral thecal sac. Mild flattening of the left ventral cord without cord signal changes. This is also slightly regressed from previous and is decreased in size. Residual mild spinal stenosis. Foramina remain patent.  C5-C6: Chronic intervertebral disc space narrowing with diffuse disc osteophyte complex, asymmetric to the left. Broad posterior component flattens and partially effaces the ventral thecal sac with resultant mild spinal stenosis, greater on the left. No significant cord deformity. Left greater than right uncinate spurring with moderate left C6 foraminal narrowing. Right neural foramina remains patent. Appearance is stable from previous.  C6-C7: Persistent subtle right paracentral disc protrusion indents the right ventral thecal sac (series 9, image 24). No  significant spinal stenosis or cord deformity, although again the ventral right C7 nerve root could be affected. Size of this protrusion is stable to perhaps slightly decreased from prior. Foramina remain widely patent.  C7-T1: Negative interspace. Mild left-sided facet hypertrophy. No canal or foraminal stenosis.  Visualized upper thoracic spine demonstrates no new significant finding.  IMPRESSION: 1. Persistent small right paracentral disc protrusion at C6-7, potentially affecting the ventral right C7 nerve root. Overall, size of this protrusion is stable to perhaps slightly decreased from prior. 2. Left paracentral to foraminal disc osteophyte complex at C5-6 with resultant mild canal and moderate left C6 foraminal stenosis, stable. 3. Partial interval regression of previously seen small right paracentral to foraminal disc protrusion at C3-4 as well as left-sided protrusion at C4-5. 4. Cerebellar tonsils are low lying extending up to 7 mm through the foramen magnum, suggesting a Chiari 1 malformation.   Electronically Signed   By: Rise Mu M.D.   On: 07/01/2020 04:39     Objective:  VS:  HT:    WT:   BMI:     BP:111/80  HR:83bpm  TEMP: ( )  RESP:  Physical Exam Vitals and nursing note reviewed.  Constitutional:      General: She is not in acute distress.    Appearance: Normal appearance. She is not ill-appearing.  HENT:     Head: Normocephalic and atraumatic.     Right Ear: External ear normal.     Left Ear: External ear normal.  Eyes:     Extraocular Movements: Extraocular movements intact.  Cardiovascular:  Rate and Rhythm: Normal rate.     Pulses: Normal pulses.  Musculoskeletal:     Cervical back: Tenderness present. No rigidity.     Right lower leg: No edema.     Left lower leg: No edema.     Comments: Patient has good strength in the upper extremities including 5 out of 5 strength in wrist extension long finger flexion and APB.   There is no atrophy of the hands intrinsically.  There is a negative Hoffmann's test.   Lymphadenopathy:     Cervical: No cervical adenopathy.  Skin:    Findings: No erythema, lesion or rash.  Neurological:     General: No focal deficit present.     Mental Status: She is alert and oriented to person, place, and time.     Sensory: No sensory deficit.     Motor: No weakness or abnormal muscle tone.     Coordination: Coordination normal.  Psychiatric:        Mood and Affect: Mood normal.        Behavior: Behavior normal.      Imaging: No results found.

## 2020-10-06 NOTE — Procedures (Signed)
Cervical Transforaminal Selective Nerve Root Block with Fluoroscopic Guidance   Patient: Lindsey Graham      Date of Birth: Dec 15, 1975 MRN: [id number] PCP: Lavada Mesi, MD      Visit Date: 09/08/2020   Universal Protocol:    Date/Time: 02/23/229:57 AM  Consent Given By: the patient  Position: SUPINE  Additional Comments: Vital signs were monitored before and after the procedure. Patient was prepped and draped in the usual sterile fashion. The correct patient, procedure, and site was verified.   Injection Procedure Details:  Procedure Site One Meds Administered:  Meds ordered this encounter  Medications  . dexamethasone (DECADRON) injection 15 mg     Laterality: Left  Location/Site:  Left C6 SNRB C5-6  Needle size: 25 G  Needle type: spinal needle  Needle Placement: Just at the determined foramen  Findings:    -Comments: Excellent flow of contrast along the nerve and into the epidural space.  Procedure Details: The C-arm was obliqued to the ipsilateral side of the patient to about 45 from AP.  The C-arm was then tilted to square off the inferior endplate of the level targeted.  C-arm obliquity was then adjusted to maximize the size of the foramen.  The skin over the targeted area which was the posterior inferior portion of the foramen was then anesthetized with a small amount of 1% lidocaine without epinephrine.  A 25-gauge 2-1/2 inch spinal needle was then introduced through the skin and down to the targeted area under biplanar fluoroscopic guidance.  Multiple AP and lateral/oblique views were monitored during needle manipulation.  Final placement was with the needle just outside of the 6 o'clock position on the AP view.  After negative aspirate for air, blood, and CSF, a 1 ml. volume of Isovue-250 was injected into the foramen and the flow of contrast was observed. Radiographs were obtained for documentation purposes.   When there was no arterial or venous flow of  contrast, the injectate was administered into the level noted above.     Additional Comments:  The patient tolerated the procedure well Dressing: 2 x 2 sterile gauze with Band-Aid    Post-procedure details: Patient was observed during the procedure. Post-procedure instructions were reviewed.  Patient left the clinic in stable condition.

## 2020-10-08 NOTE — Telephone Encounter (Signed)
I have not heard anything back from the attorneys office

## 2020-10-14 ENCOUNTER — Ambulatory Visit (INDEPENDENT_AMBULATORY_CARE_PROVIDER_SITE_OTHER): Payer: Self-pay | Admitting: Specialist

## 2020-10-14 ENCOUNTER — Encounter: Payer: Self-pay | Admitting: Specialist

## 2020-10-14 ENCOUNTER — Other Ambulatory Visit: Payer: Self-pay

## 2020-10-14 VITALS — BP 106/75 | HR 86 | Ht 66.0 in | Wt 172.0 lb

## 2020-10-14 DIAGNOSIS — M5010 Cervical disc disorder with radiculopathy, unspecified cervical region: Secondary | ICD-10-CM

## 2020-10-14 DIAGNOSIS — M545 Low back pain, unspecified: Secondary | ICD-10-CM

## 2020-10-14 DIAGNOSIS — G8929 Other chronic pain: Secondary | ICD-10-CM

## 2020-10-14 DIAGNOSIS — M5136 Other intervertebral disc degeneration, lumbar region: Secondary | ICD-10-CM

## 2020-10-14 DIAGNOSIS — M533 Sacrococcygeal disorders, not elsewhere classified: Secondary | ICD-10-CM
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
## 2020-10-19 ENCOUNTER — Ambulatory Visit: Payer: Self-pay | Admitting: Family Medicine

## 2020-10-19 ENCOUNTER — Encounter: Payer: Self-pay | Admitting: Family Medicine

## 2020-10-19 ENCOUNTER — Telehealth: Payer: Self-pay | Admitting: Family Medicine

## 2020-10-19 VITALS — BP 105/76 | HR 75 | Ht 66.0 in | Wt 172.0 lb

## 2020-10-19 DIAGNOSIS — G8929 Other chronic pain: Secondary | ICD-10-CM

## 2020-10-19 DIAGNOSIS — M5020 Other cervical disc displacement, unspecified cervical region: Secondary | ICD-10-CM

## 2020-10-19 DIAGNOSIS — M545 Low back pain, unspecified: Secondary | ICD-10-CM

## 2020-10-19 DIAGNOSIS — F0781 Postconcussional syndrome: Secondary | ICD-10-CM

## 2020-10-19 MED ORDER — RIZATRIPTAN BENZOATE 10 MG PO TABS
10.0000 mg | ORAL_TABLET | ORAL | 11 refills | Status: DC | PRN
Start: 2020-10-19 — End: 2021-11-02

## 2020-10-19 NOTE — Progress Notes (Signed)
I have read the note, and I agree with the clinical assessment and plan.  Marek Nghiem A. Danyele Smejkal, MD, PhD, FAAN Certified in Neurology, Clinical Neurophysiology, Sleep Medicine, Pain Medicine and Neuroimaging  [facility] [address], [address] [place], [place] [address] [phone]  

## 2020-10-19 NOTE — Telephone Encounter (Signed)
Pt. is asking for a doctor's note to be uploaded to mychart for today's visit.

## 2020-10-19 NOTE — Patient Instructions (Signed)
Below is our plan:  We will continue Emgality and rizatriptan. Follow up with Dr Jorge Mandril and Dr Otelia Sergeant as directed.   Please make sure you are staying well hydrated. I recommend 50-60 ounces daily. Well balanced diet and regular exercise encouraged. Consistent sleep schedule with 6-8 hours recommended.   Please continue follow up with care team as directed.   Follow up with me in 12 months, sooner if needed   You may receive a survey regarding today's visit. I encourage you to leave honest feed back as I do use this information to improve patient care. Thank you for seeing me today!       Migraine Headache A migraine headache is a very strong throbbing pain on one side or both sides of your head. This type of headache can also cause other symptoms. It can last from 4 hours to 3 days. Talk with your doctor about what things may bring on (trigger) this condition. What are the causes? The exact cause of this condition is not known. This condition may be triggered or caused by:  Drinking alcohol.  Smoking.  Taking medicines, such as: ? Medicine used to treat chest pain (nitroglycerin). ? Birth control pills. ? Estrogen. ? Some blood pressure medicines.  Eating or drinking certain products.  Doing physical activity. Other things that may trigger a migraine headache include:  Having a menstrual period.  Pregnancy.  Hunger.  Stress.  Not getting enough sleep or getting too much sleep.  Weather changes.  Tiredness (fatigue). What increases the risk?  Being 43-82 years old.  Being female.  Having a family history of migraine headaches.  Being Caucasian.  Having depression or anxiety.  Being very overweight. What are the signs or symptoms?  A throbbing pain. This pain may: ? Happen in any area of the head, such as on one side or both sides. ? Make it hard to do daily activities. ? Get worse with physical activity. ? Get worse around bright lights or loud  noises.  Other symptoms may include: ? Feeling sick to your stomach (nauseous). ? Vomiting. ? Dizziness. ? Being sensitive to bright lights, loud noises, or smells.  Before you get a migraine headache, you may get warning signs (an aura). An aura may include: ? Seeing flashing lights or having blind spots. ? Seeing bright spots, halos, or zigzag lines. ? Having tunnel vision or blurred vision. ? Having numbness or a tingling feeling. ? Having trouble talking. ? Having weak muscles.  Some people have symptoms after a migraine headache (postdromal phase), such as: ? Tiredness. ? Trouble thinking (concentrating). How is this treated?  Taking medicines that: ? Relieve pain. ? Relieve the feeling of being sick to your stomach. ? Prevent migraine headaches.  Treatment may also include: ? Having acupuncture. ? Avoiding foods that bring on migraine headaches. ? Learning ways to control your body functions (biofeedback). ? Therapy to help you know and deal with negative thoughts (cognitive behavioral therapy). Follow these instructions at home: Medicines  Take over-the-counter and prescription medicines only as told by your doctor.  Ask your doctor if the medicine prescribed to you: ? Requires you to avoid driving or using heavy machinery. ? Can cause trouble pooping (constipation). You may need to take these steps to prevent or treat trouble pooping:  Drink enough fluid to keep your pee (urine) pale yellow.  Take over-the-counter or prescription medicines.  Eat foods that are high in fiber. These include beans, whole grains, and fresh fruits  and vegetables.  Limit foods that are high in fat and sugar. These include fried or sweet foods. Lifestyle  Do not drink alcohol.  Do not use any products that contain nicotine or tobacco, such as cigarettes, e-cigarettes, and chewing tobacco. If you need help quitting, ask your doctor.  Get at least 8 hours of sleep every  night.  Limit and deal with stress. General instructions  Keep a journal to find out what may bring on your migraine headaches. For example, write down: ? What you eat and drink. ? How much sleep you get. ? Any change in what you eat or drink. ? Any change in your medicines.  If you have a migraine headache: ? Avoid things that make your symptoms worse, such as bright lights. ? It may help to lie down in a dark, quiet room. ? Do not drive or use heavy machinery. ? Ask your doctor what activities are safe for you.  Keep all follow-up visits as told by your doctor. This is important.      Contact a doctor if:  You get a migraine headache that is different or worse than others you have had.  You have more than 15 headache days in one month. Get help right away if:  Your migraine headache gets very bad.  Your migraine headache lasts longer than 72 hours.  You have a fever.  You have a stiff neck.  You have trouble seeing.  Your muscles feel weak or like you cannot control them.  You start to lose your balance a lot.  You start to have trouble walking.  You pass out (faint).  You have a seizure. Summary  A migraine headache is a very strong throbbing pain on one side or both sides of your head. These headaches can also cause other symptoms.  This condition may be treated with medicines and changes to your lifestyle.  Keep a journal to find out what may bring on your migraine headaches.  Contact a doctor if you get a migraine headache that is different or worse than others you have had.  Contact your doctor if you have more than 15 headache days in a month. This information is not intended to replace advice given to you by your health care provider. Make sure you discuss any questions you have with your health care provider. Document Revised: 11/22/2018 Document Reviewed: 09/12/2018 Elsevier Patient Education  2021 [employer]   Post-Concussion Syndrome  A  concussion is a brain injury. Post-concussion syndrome is when symptoms last longer than normal after a head injury. What are the causes? The cause of this condition is not known. It can happen if your head injury was mild or very bad. What increases the risk?  Being female.  Being young.  Having had a head injury before.  Being sad (depressed) or feeling worried or nervous (having anxiety).  Fainting when you got your concussion or not being able to remember it.  Having many symptoms or very bad symptoms at the time of your injury. What are the signs or symptoms? After a head injury, you may have physical symptoms, such as:  Having headaches.  Feeling tired.  Feeling dizzy.  Feeling weak.  Having trouble seeing.  Having trouble in bright lights.  Having trouble hearing.  Having problems balancing. You may also have mental and emotional symptoms, such as:  Not being able to remember things.  Not being able to focus.  Having trouble sleeping.  Feeling grouchy (irritable).  Feeling worried.  Feeling sad.  Having trouble learning new things. These can last from weeks to months. How is this treated? Treatment for this condition may depend on your symptoms. Symptoms usually go away on their own over time. If you need treatment, it may include:  Taking medicines.  Resting your brain and body for a few days.  Doing therapy to help you recover (rehabilitation therapy). This may include: ? Physical or occupational therapy. This may include exercises. ? Talking to a counselor. ? Speech therapy. ? Therapy to help your eyes. Follow these instructions at home: Medicines  Take all medicines only as told by your doctor.  Avoid pain medicines that have opioids in them. Ask your doctor which pain medicine to take. Activity  Limit activities as told by your doctor. This may include not doing the following: ? Homework. ? Job-related work. ? Hard  thinking. ? Watching TV. ? Using a computer or phone. ? Puzzles. ? Exercise. ? Sports.  Slowly return to your normal activity as told by your doctor.  Stop an activity if you have symptoms.  Rest. Try to: ? Sleep 7-9 hours each night. ? Take naps or breaks when you feel tired during the day.  Do not do anything that may cause you to get hurt again right away. General instructions  Do not drink alcohol until your doctor says that you can.  Keep track of your symptoms.  Keep all follow-up visits as told by your doctor. This is important.   Contact a doctor if:  You do not improve.  You get worse.  You have another injury. Get help right away if:  You have a very bad headache or a headache that gets worse.  You feel confused.  You feel very sleepy.  You faint.  You vomit.  You feel weak in any part of your body.  You feel numb in any part of your body.  You start shaking (have a seizure).  You have trouble talking. Summary  This condition is when symptoms last longer than normal after a head injury.  Limit your activities after your injury. Slowly return to normal activities as told by your doctor.  Rest.  Do not drink alcohol and avoid pain medicines that have opioids in them.  Call your doctor if your symptoms get worse. This information is not intended to replace advice given to you by your health care provider. Make sure you discuss any questions you have with your health care provider. Document Revised: 07/23/2019 Document Reviewed: 07/23/2019 Elsevier Patient Education  2021 [employer].
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
## 2020-10-20 ENCOUNTER — Encounter: Payer: Self-pay | Admitting: *Deleted

## 2020-10-25 ENCOUNTER — Telehealth: Payer: Self-pay | Admitting: Specialist

## 2020-10-25 NOTE — Telephone Encounter (Signed)
Was this routed to me in error? Dr. Barbaraann Faster patient I believe.

## 2020-10-25 NOTE — Telephone Encounter (Signed)
Patient came by. Would like a letter with the cost of surgery, the amount of time out of work, the number of PT visits she will need. Her call back number is [phone] 

## 2020-10-26 NOTE — Telephone Encounter (Signed)
Need surgery order.  Also, I am not sure about P.T. and time out of work.

## 2020-10-26 NOTE — Telephone Encounter (Signed)
Patient came by. Would like a letter with the cost of surgery, the amount of time out of work, the number of PT visits she will need. Her call back number is [phone]

## 2020-10-27 NOTE — Telephone Encounter (Signed)
I called and lmom that usually patients do not need PT for this and as far as time out of work is 8-12 weeks depending on her recovery. ---Can you please advise cost of surgery.

## 2020-11-05 NOTE — Telephone Encounter (Signed)
Spoke with patient, she needs to know how many follow ups after surgery and if there would be any additional tests needing to be done.  I will send her a My Chart message with the information

## 2020-11-05 NOTE — Telephone Encounter (Signed)
Generally we will follow a patient who has surgery for 3 months. The number of visits depends on how well the patient is doing but a visit once a month is about right.

## 2020-11-05 NOTE — Telephone Encounter (Signed)
Spoke with patient, she needs to know how many follow ups after surgery and if there would be any additional tests needing to be done.  I will send her a My Chart message with the information 

## 2020-11-05 NOTE — Telephone Encounter (Signed)
I called patient and left voice mail for return call. °

## 2020-11-05 NOTE — Telephone Encounter (Signed)
Please advise on message below. Thank you!

## 2020-11-08 ENCOUNTER — Encounter: Payer: Self-pay | Admitting: Specialist

## 2020-11-08 NOTE — Telephone Encounter (Signed)
Please see message below, per Dr. Otelia Sergeant.  Thank you.

## 2020-11-08 NOTE — Telephone Encounter (Signed)
Sent My Chart message to patient with informational letter attached addressing her questions.

## 2020-11-16 IMAGING — CR LEFT SHOULDER - 2+ VIEW
2 series · 2 of 2 positions shown · non-contrast
Comparison: None.

CLINICAL DATA: Chest pain

EXAM:
LEFT SHOULDER - 2+ VIEW

[shoulder grashey]
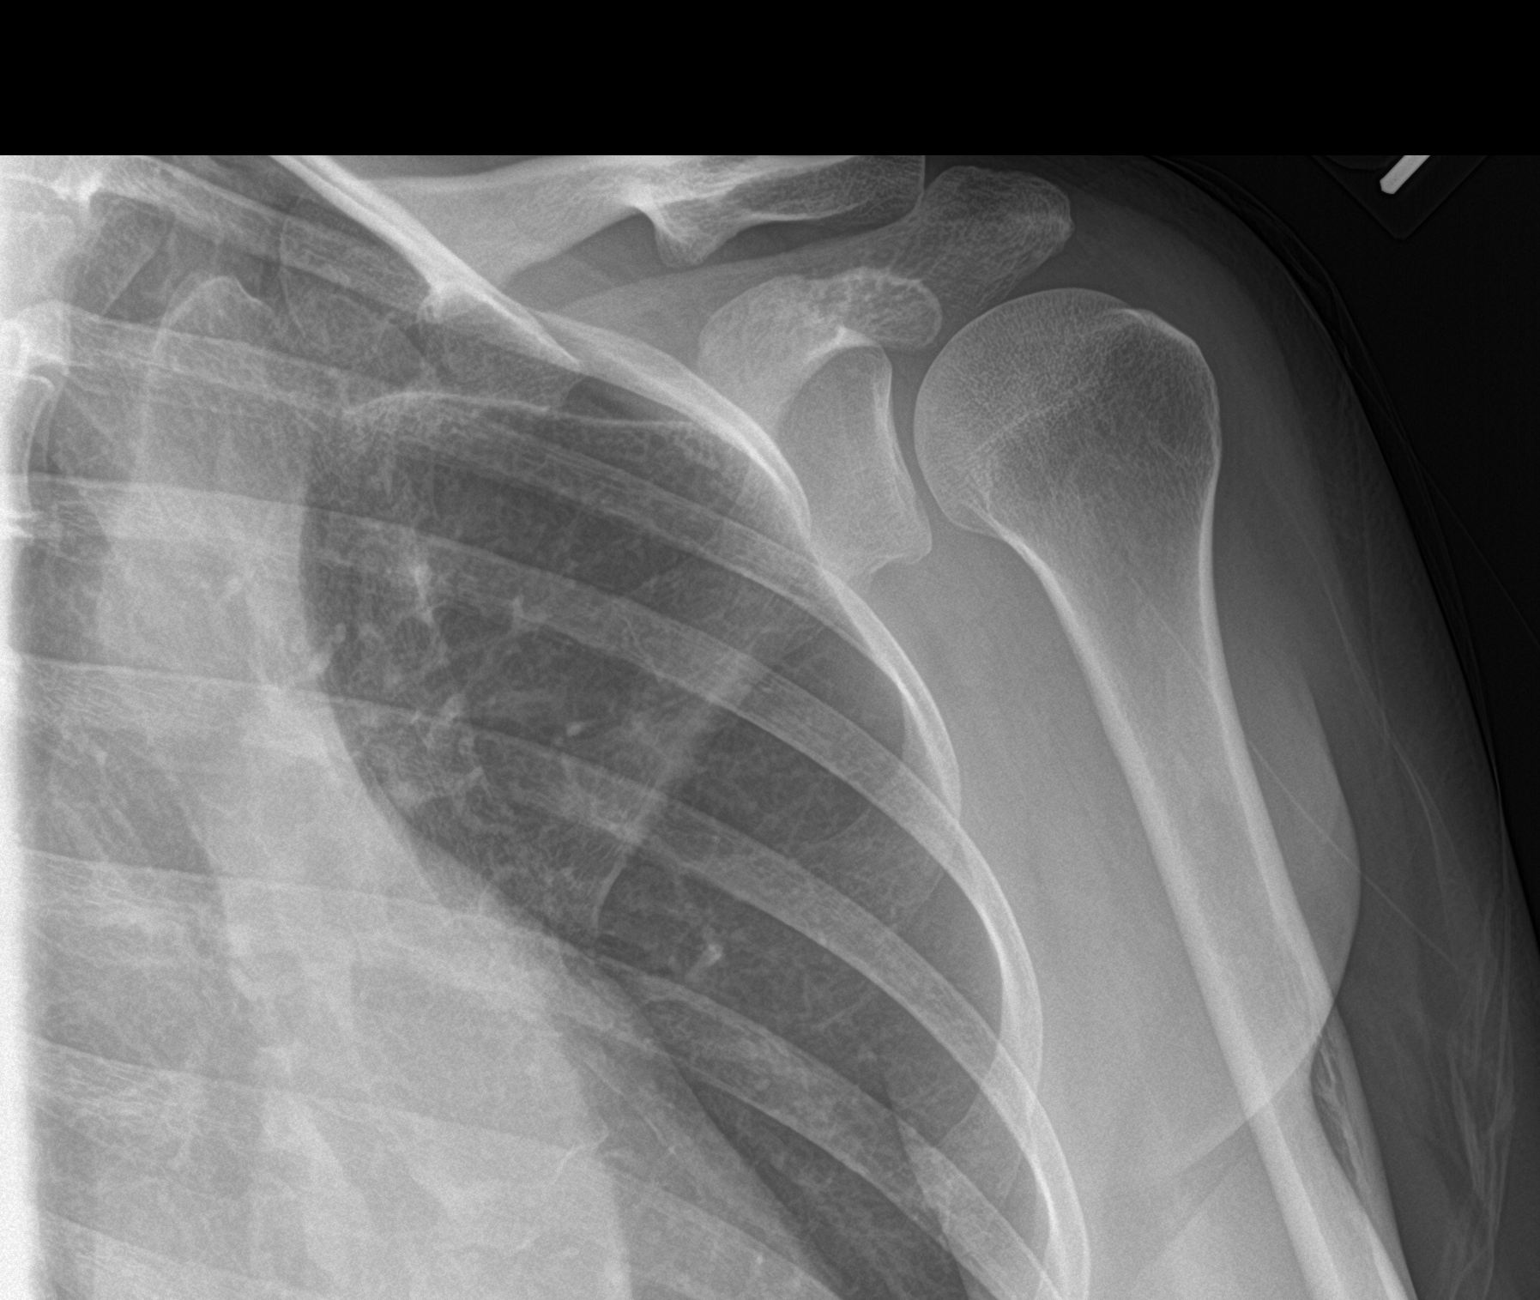

[shoulder y view]
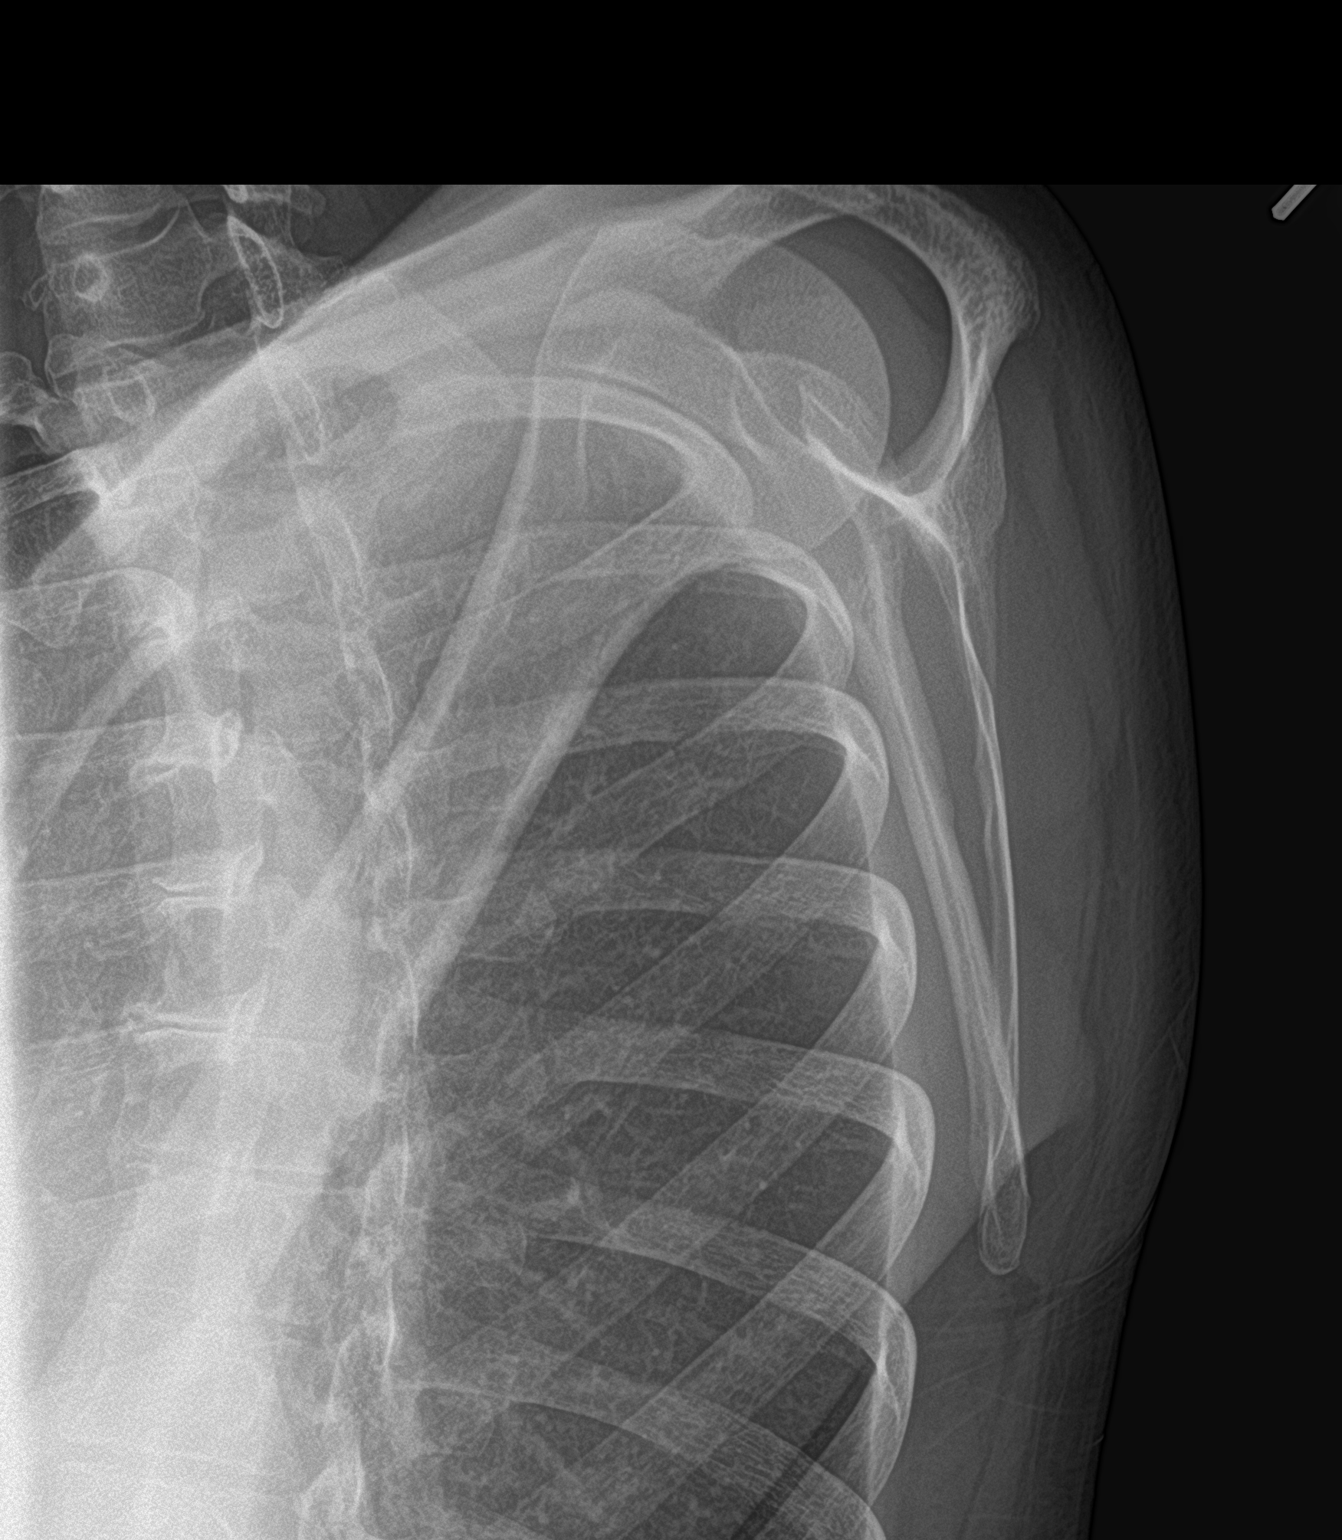

[2 of 2 positions shown; findings below may reference images not displayed]

FINDINGS: No acute fracture or dislocation. No overlying soft tissue swelling.
IMPRESSION: No acute osseus injury.

## 2020-11-18 ENCOUNTER — Other Ambulatory Visit: Payer: Self-pay

## 2020-11-18 ENCOUNTER — Encounter: Payer: Self-pay | Admitting: Specialist

## 2020-11-18 ENCOUNTER — Ambulatory Visit (INDEPENDENT_AMBULATORY_CARE_PROVIDER_SITE_OTHER): Payer: Self-pay | Admitting: Specialist

## 2020-11-18 VITALS — BP 110/76 | HR 80 | Ht 66.0 in | Wt 172.0 lb

## 2020-11-18 DIAGNOSIS — M5010 Cervical disc disorder with radiculopathy, unspecified cervical region: Secondary | ICD-10-CM

## 2020-11-18 DIAGNOSIS — M542 Cervicalgia: Secondary | ICD-10-CM
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
## 2020-11-25 ENCOUNTER — Encounter: Payer: Self-pay | Admitting: Family Medicine

## 2020-11-25 ENCOUNTER — Ambulatory Visit (INDEPENDENT_AMBULATORY_CARE_PROVIDER_SITE_OTHER): Payer: BC Managed Care – PPO | Admitting: Family Medicine

## 2020-11-25 ENCOUNTER — Other Ambulatory Visit: Payer: Self-pay

## 2020-11-25 VITALS — BP 116/71 | HR 79 | Ht 66.0 in | Wt 169.6 lb

## 2020-11-25 DIAGNOSIS — E559 Vitamin D deficiency, unspecified: Secondary | ICD-10-CM | POA: Diagnosis not present

## 2020-11-25 DIAGNOSIS — R945 Abnormal results of liver function studies: Secondary | ICD-10-CM | POA: Diagnosis not present

## 2020-11-25 DIAGNOSIS — F419 Anxiety disorder, unspecified: Secondary | ICD-10-CM | POA: Diagnosis not present

## 2020-11-25 DIAGNOSIS — R7989 Other specified abnormal findings of blood chemistry: Secondary | ICD-10-CM

## 2020-11-25 DIAGNOSIS — M25531 Pain in right wrist: Secondary | ICD-10-CM | POA: Diagnosis not present

## 2020-11-25 DIAGNOSIS — Z0000 Encounter for general adult medical examination without abnormal findings: Secondary | ICD-10-CM
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
## 2020-11-27 LAB — COMPREHENSIVE METABOLIC PANEL
AG Ratio: 2 (calc) (ref 1.0–2.5)
ALT: 10 U/L (ref 6–29)
AST: 13 U/L (ref 10–35)
Albumin: 4.6 g/dL (ref 3.6–5.1)
Alkaline phosphatase (APISO): 45 U/L (ref 31–125)
BUN: 11 mg/dL (ref 7–25)
CO2: 23 mmol/L (ref 20–32)
Calcium: 9.6 mg/dL (ref 8.6–10.2)
Chloride: 106 mmol/L (ref 98–110)
Creat: 0.75 mg/dL (ref 0.50–1.10)
Globulin: 2.3 g/dL (calc) (ref 1.9–3.7)
Glucose, Bld: 89 mg/dL (ref 65–99)
Potassium: 4.4 mmol/L (ref 3.5–5.3)
Sodium: 139 mmol/L (ref 135–146)
Total Bilirubin: 0.4 mg/dL (ref 0.2–1.2)
Total Protein: 6.9 g/dL (ref 6.1–8.1)

## 2020-11-27 LAB — CBC WITH DIFFERENTIAL/PLATELET
Absolute Monocytes: 499 cells/uL (ref 200–950)
Basophils Absolute: 51 cells/uL (ref 0–200)
Basophils Relative: 0.8 %
Eosinophils Absolute: 38 cells/uL (ref 15–500)
Eosinophils Relative: 0.6 %
HCT: 40.6 % (ref 35.0–45.0)
Hemoglobin: 13 g/dL (ref 11.7–15.5)
Lymphs Abs: 1984 cells/uL (ref 850–3900)
MCH: 27.7 pg (ref 27.0–33.0)
MCHC: 32 g/dL (ref 32.0–36.0)
MCV: 86.4 fL (ref 80.0–100.0)
MPV: 10.3 fL (ref 7.5–12.5)
Monocytes Relative: 7.8 %
Neutro Abs: 3827 cells/uL (ref 1500–7800)
Neutrophils Relative %: 59.8 %
Platelets: 322 10*3/uL (ref 140–400)
RBC: 4.7 10*6/uL (ref 3.80–5.10)
RDW: 12.8 % (ref 11.0–15.0)
Total Lymphocyte: 31 %
WBC: 6.4 10*3/uL (ref 3.8–10.8)

## 2020-11-27 LAB — THYROID PANEL WITH TSH
Free Thyroxine Index: 1.9 (ref 1.4–3.8)
T3 Uptake: 30 % (ref 22–35)
T4, Total: 6.4 ug/dL (ref 5.1–11.9)
TSH: 2.65 mIU/L

## 2020-11-27 LAB — VITAMIN D 25 HYDROXY (VIT D DEFICIENCY, FRACTURES): Vit D, 25-Hydroxy: 26 ng/mL — ABNORMAL LOW (ref 30–100)

## 2020-11-27 LAB — C-REACTIVE PROTEIN: CRP: 2.7 mg/L (ref <8.0)

## 2020-11-27 LAB — LIPID PANEL
Cholesterol: 145 mg/dL (ref <200)
HDL: 46 mg/dL — ABNORMAL LOW (ref >=50)
LDL Cholesterol (Calc): 84 mg/dL (calc)
Non-HDL Cholesterol (Calc): 99 mg/dL (calc) (ref <130)
Total CHOL/HDL Ratio: 3.2 (calc) (ref <5.0)
Triglycerides: 72 mg/dL (ref <150)

## 2020-11-27 LAB — URIC ACID: Uric Acid, Serum: 4 mg/dL (ref 2.5–7.0)

## 2020-11-27 LAB — ANA: Anti Nuclear Antibody (ANA): NEGATIVE

## 2020-11-27 LAB — SEDIMENTATION RATE: Sed Rate: 2 mm/h (ref 0–20)

## 2020-11-27 LAB — CYCLIC CITRUL PEPTIDE ANTIBODY, IGG: Cyclic Citrullin Peptide Ab: <16 UNITS

## 2020-11-27 LAB — RHEUMATOID FACTOR: Rheumatoid fact SerPl-aCnc: <14 IU/mL (ref <14)

## 2020-11-29 ENCOUNTER — Telehealth: Payer: Self-pay | Admitting: Family Medicine

## 2020-11-29 NOTE — Telephone Encounter (Signed)
Labs show:   Vitamin D is low at 26.  What is your current dosage?  Target level is 50-80.  All else looks good.

## 2020-12-20 ENCOUNTER — Other Ambulatory Visit: Payer: Self-pay | Admitting: Specialist

## 2020-12-21 ENCOUNTER — Other Ambulatory Visit: Payer: Self-pay | Admitting: Specialist

## 2020-12-30 ENCOUNTER — Other Ambulatory Visit: Payer: Self-pay | Admitting: Family Medicine

## 2020-12-30 ENCOUNTER — Ambulatory Visit: Payer: Self-pay | Admitting: Specialist

## 2020-12-30 DIAGNOSIS — R0600 Dyspnea, unspecified: Secondary | ICD-10-CM

## 2021-01-14 ENCOUNTER — Ambulatory Visit (INDEPENDENT_AMBULATORY_CARE_PROVIDER_SITE_OTHER): Payer: BC Managed Care – PPO | Admitting: Family Medicine

## 2021-01-14 ENCOUNTER — Encounter: Payer: Self-pay | Admitting: Family Medicine

## 2021-01-14 ENCOUNTER — Other Ambulatory Visit: Payer: Self-pay | Admitting: Family Medicine

## 2021-01-14 ENCOUNTER — Ambulatory Visit: Payer: Self-pay

## 2021-01-14 ENCOUNTER — Other Ambulatory Visit: Payer: Self-pay

## 2021-01-14 DIAGNOSIS — M25571 Pain in right ankle and joints of right foot: Secondary | ICD-10-CM

## 2021-01-14 DIAGNOSIS — R0600 Dyspnea, unspecified: Secondary | ICD-10-CM | POA: Diagnosis not present

## 2021-01-14 DIAGNOSIS — M25531 Pain in right wrist: Secondary | ICD-10-CM | POA: Diagnosis not present

## 2021-01-14 DIAGNOSIS — Z0000 Encounter for general adult medical examination without abnormal findings: Secondary | ICD-10-CM

## 2021-01-14 MED ORDER — ALBUTEROL SULFATE HFA 108 (90 BASE) MCG/ACT IN AERS
INHALATION_SPRAY | RESPIRATORY_TRACT | 6 refills | Status: DC
Start: 2021-01-14 — End: 2021-01-14
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
## 2021-02-10 ENCOUNTER — Ambulatory Visit (INDEPENDENT_AMBULATORY_CARE_PROVIDER_SITE_OTHER): Payer: BC Managed Care – PPO | Admitting: Specialist

## 2021-02-10 ENCOUNTER — Ambulatory Visit: Payer: BC Managed Care – PPO | Admitting: Specialist

## 2021-02-10 ENCOUNTER — Other Ambulatory Visit: Payer: Self-pay

## 2021-02-10 ENCOUNTER — Encounter: Payer: Self-pay | Admitting: Specialist

## 2021-02-10 ENCOUNTER — Telehealth: Payer: Self-pay | Admitting: Family Medicine

## 2021-02-10 VITALS — BP 106/79 | HR 76 | Ht 66.0 in | Wt 170.0 lb

## 2021-02-10 DIAGNOSIS — M25571 Pain in right ankle and joints of right foot: Secondary | ICD-10-CM | POA: Diagnosis not present

## 2021-02-10 DIAGNOSIS — M7989 Other specified soft tissue disorders: Secondary | ICD-10-CM

## 2021-02-10 DIAGNOSIS — M25531 Pain in right wrist: Secondary | ICD-10-CM

## 2021-02-10 DIAGNOSIS — E559 Vitamin D deficiency, unspecified: Secondary | ICD-10-CM

## 2021-02-10 DIAGNOSIS — M542 Cervicalgia: Secondary | ICD-10-CM

## 2021-02-10 DIAGNOSIS — M79605 Pain in left leg: Secondary | ICD-10-CM

## 2021-02-10 DIAGNOSIS — M79604 Pain in right leg: Secondary | ICD-10-CM

## 2021-02-10 DIAGNOSIS — R140 Abdominal distension (gaseous): Secondary | ICD-10-CM

## 2021-02-10 DIAGNOSIS — M5010 Cervical disc disorder with radiculopathy, unspecified cervical region: Secondary | ICD-10-CM

## 2021-02-10 MED ORDER — GABAPENTIN 100 MG PO CAPS: ORAL_CAPSULE | ORAL | 4 refills | Status: DC

## 2021-02-10 NOTE — Telephone Encounter (Signed)
I called and left voice mail -- the referral will be faxed to [facility] on [address] -- they will call her to set up an appointment.

## 2021-02-10 NOTE — Telephone Encounter (Signed)
Patient says she lost the RX for hand therapy, would like Dr. Prince Rome to write a new RX. Would like it faxed to the hand therapy center. Does not know which hand therapy center it was. Her call back number is [phone]
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
## 2021-03-18 ENCOUNTER — Other Ambulatory Visit: Payer: Self-pay

## 2021-03-18 ENCOUNTER — Encounter: Payer: Self-pay | Admitting: Family Medicine

## 2021-03-18 ENCOUNTER — Ambulatory Visit (INDEPENDENT_AMBULATORY_CARE_PROVIDER_SITE_OTHER): Payer: BC Managed Care – PPO | Admitting: Family Medicine

## 2021-03-18 VITALS — BP 99/65 | HR 76 | Ht 66.0 in | Wt 168.2 lb

## 2021-03-18 DIAGNOSIS — R300 Dysuria: Secondary | ICD-10-CM | POA: Diagnosis not present

## 2021-03-18 MED ORDER — SULFAMETHOXAZOLE-TRIMETHOPRIM 800-160 MG PO TABS: 1.0000 | ORAL_TABLET | Freq: Two times a day (BID) | ORAL | 0 refills | Status: DC

## 2021-03-18 NOTE — Progress Notes (Signed)
   Office Visit Note   Patient: Lindsey Graham           Date of Birth: [birth year]           MRN: [id number] Visit Date: 03/18/2021 Requested by: Lavada Mesi, MD [address] [place],  [place] [address] PCP: Lavada Mesi, MD  Subjective: Chief Complaint  Patient presents with   Other    Follow up UTI - was treated with antibiotics (bactrim ds) x 7 days. She says she is "unable to urinate as freely" as when she was on the antibiotics - for 2 days now.     HPI: Is here with slow urinary flow.  She recently went to urgent care for a UTI.  Her main symptom was hematuria.  Bactrim helped, but her symptoms never fully resolved.  No fevers or chills, no night sweats.  She has noticed some swelling in her ankles.              ROS:   All other systems were reviewed and are negative.  Objective: Vital Signs: BP 99/65 (BP Location: Left Arm, Patient Position: Sitting, Cuff Size: Normal)   Pulse 76   Ht 5\' 6"  (1.676 m)   Wt 168 lb 3.2 oz (76.3 kg)   BMI 27.15 kg/m   Physical Exam:  General:  Alert and oriented, in no acute distress. Pulm:  Breathing unlabored. Psy:  Normal mood, congruent affect.  GU: She has no CVA tenderness.  Mild suprapubic tenderness. Extremities: Trace edema at the ankles.   Imaging: No results found.  Assessment & Plan: Dysuria -Urine culture.  Resume Bactrim.  Switch to something different if indicated based on culture results.     Procedures: No procedures performed        PMFS History: Patient Active Problem List   Diagnosis Date Noted   Chronic low back pain 03/26/2020   Neck pain 03/26/2020   Other chronic pain 03/26/2020   Nodule of upper lobe of right lung 10/08/2019   Head injury 06/04/2019   Post concussion syndrome 06/04/2019   Chronic migraine 06/04/2019   Back pain 06/04/2019   History reviewed. No pertinent past medical history.  Family History  Problem Relation Age of Onset   COPD Father     History reviewed. No pertinent  surgical history. Social History   Occupational History   Not on file  Tobacco Use   Smoking status: Former    Types: E-cigarettes   Smokeless tobacco: Current  Vaping Use   Vaping Use: Every day  Substance and Sexual Activity   Alcohol use: Never   Drug use: Never   Sexual activity: Yes

## 2021-03-24 LAB — URINE CULTURE
MICRO NUMBER:: 12213575
SPECIMEN QUALITY:: ADEQUATE

## 2021-03-25 ENCOUNTER — Telehealth: Payer: Self-pay | Admitting: Family Medicine

## 2021-03-25 MED ORDER — NITROFURANTOIN MONOHYD MACRO 100 MG PO CAPS: 100.0000 mg | ORAL_CAPSULE | Freq: Two times a day (BID) | ORAL | 0 refills | Status: DC

## 2021-03-25 NOTE — Telephone Encounter (Signed)
Urine culture grew Enterococcus, which should respond to Macrobid.  I will call in the prescription.

## 2021-03-30 ENCOUNTER — Telehealth: Payer: Self-pay | Admitting: Neurology

## 2021-03-30 ENCOUNTER — Telehealth: Payer: Self-pay | Admitting: Family Medicine

## 2021-03-30 ENCOUNTER — Telehealth: Payer: Self-pay

## 2021-03-30 MED ORDER — EMGALITY 120 MG/ML ~~LOC~~ SOAJ: SUBCUTANEOUS | 7 refills | Status: DC

## 2021-03-30 NOTE — Telephone Encounter (Signed)
Pt called requesting refill for Galcanezumab-gnlm (EMGALITY) 120 MG/ML SOAJ. Pt states she needs them sent back to [id number].

## 2021-03-30 NOTE — Telephone Encounter (Signed)
Faxed last OV note to [facility] to fax number [phone] 

## 2021-03-30 NOTE — Telephone Encounter (Signed)
PA completed through [facility] [id number] Should hear a response within 24 hrs

## 2021-03-30 NOTE — Telephone Encounter (Signed)
Refill placed for the pt and sent to the pharmacy

## 2021-04-04 NOTE — Telephone Encounter (Signed)
PA approved effective 04/01/2021 to 04/01/2022

## 2021-04-22 ENCOUNTER — Telehealth: Payer: Self-pay | Admitting: Family Medicine

## 2021-04-22 NOTE — Telephone Encounter (Signed)
Pt called asking for a CB; she stated she wanted to do another urine test to make sure everything is good but since dr.Hilts left she's not sure what to do. Pt states it ok to send a mychart message

## 2021-04-22 NOTE — Telephone Encounter (Signed)
Tried calling the patient first -- reached her voice mail. Left her a message that I would be sending her a message through MyChart. Will await a response from her.

## 2021-05-13 ENCOUNTER — Ambulatory Visit (INDEPENDENT_AMBULATORY_CARE_PROVIDER_SITE_OTHER): Payer: BC Managed Care – PPO | Admitting: Specialist

## 2021-05-13 ENCOUNTER — Encounter: Payer: Self-pay | Admitting: Specialist

## 2021-05-13 ENCOUNTER — Other Ambulatory Visit: Payer: Self-pay

## 2021-05-13 VITALS — BP 112/79 | HR 74 | Ht 66.0 in | Wt 170.0 lb

## 2021-05-13 DIAGNOSIS — M5010 Cervical disc disorder with radiculopathy, unspecified cervical region: Secondary | ICD-10-CM | POA: Diagnosis not present

## 2021-05-13 DIAGNOSIS — M542 Cervicalgia: Secondary | ICD-10-CM

## 2021-05-13 MED ORDER — GABAPENTIN 100 MG PO CAPS: ORAL_CAPSULE | ORAL | 4 refills | Status: DC
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
## 2021-05-27 ENCOUNTER — Telehealth: Payer: Self-pay | Admitting: Specialist

## 2021-05-27 NOTE — Telephone Encounter (Signed)
06/27/21 @ 215pm, added to cancellation list

## 2021-05-27 NOTE — Telephone Encounter (Signed)
Patient called. She has her MRI 10/25. She will need an appointment with Dr. Otelia Graham to go over the results. Her call back number is [phone]

## 2021-05-27 NOTE — Telephone Encounter (Signed)
[facility] asking for [facility] to be fax to them. The best fax number is [phone] and the best call back number is [phone] and [facility].

## 2021-05-31 NOTE — Telephone Encounter (Signed)
Order has been faxed to [phone] [facility]

## 2021-06-23 ENCOUNTER — Encounter: Payer: Self-pay | Admitting: Specialist

## 2021-06-23 ENCOUNTER — Other Ambulatory Visit: Payer: Self-pay

## 2021-06-23 ENCOUNTER — Ambulatory Visit (INDEPENDENT_AMBULATORY_CARE_PROVIDER_SITE_OTHER): Payer: BC Managed Care – PPO | Admitting: Specialist

## 2021-06-23 VITALS — BP 115/79 | HR 94 | Ht 66.0 in | Wt 170.0 lb

## 2021-06-23 DIAGNOSIS — M47812 Spondylosis without myelopathy or radiculopathy, cervical region: Secondary | ICD-10-CM

## 2021-06-23 DIAGNOSIS — M542 Cervicalgia: Secondary | ICD-10-CM

## 2021-06-23 DIAGNOSIS — M5010 Cervical disc disorder with radiculopathy, unspecified cervical region: Secondary | ICD-10-CM | POA: Diagnosis not present
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
## 2021-06-23 NOTE — Patient Instructions (Signed)
Plan: Avoid overhead lifting and overhead use of the arms. Do not lift greater than 5 lbs. Adjust head rest in vehicle to prevent hyperextension if rear ended. Take extra precautions to avoid falling, including use of a cane if you feel weak. Scheduling secretary Tivis Ringer will call you to arrange for surgery for your cervical spine. If you wish a second opinion please let us know and we can arrange for you. If you have worsening arm or leg numbness or weakness please call or go to an ER.  Surgery will be a decompression and artificial disc replacement at the C5-6 level    Risks of surgery include risks of infection, bleeding and risks to the spinal cord and  Risks of sore throat and difficulty swallowing which should  Improve over the next 4-6 weeks following surgery. Surgery is indicated due to upper extremity radiculopathy. In the future surgery at adjacent levels may be necessary but these levels do not appear to be related to your current symptoms or signs.

## 2021-06-27 ENCOUNTER — Ambulatory Visit: Payer: BC Managed Care – PPO | Admitting: Specialist

## 2021-07-06 ENCOUNTER — Telehealth: Payer: Self-pay

## 2021-07-06 NOTE — Telephone Encounter (Signed)
Sheet started

## 2021-07-06 NOTE — Telephone Encounter (Signed)
Patient called about scheduling surgery.  Need surgery sheet.  Thanks! 

## 2021-07-29 ENCOUNTER — Other Ambulatory Visit: Payer: Self-pay

## 2021-08-03 NOTE — Progress Notes (Signed)
Surgical Instructions    Your procedure is scheduled on 08/09/21.  Report to [facility] Main Entrance "A" at 10:45 A.M., then check in with the Admitting office.  Call this number if you have problems the morning of surgery:  [phone]   If you have any questions prior to your surgery date call [phone]: Open Monday-Friday 8am-4pm    Remember:  Do not eat after midnight the night before your surgery  You may drink clear liquids until 9:45 the morning of your surgery.   Clear liquids allowed are: Water, Non-Citrus Juices (without pulp), Carbonated Beverages, Clear Tea, Black Coffee ONLY (NO MILK, CREAM OR POWDERED CREAMER of any kind), and Gatorade    Take these medicines the morning of surgery with A SIP OF WATER:  sertraline (ZOLOFT)   IF NEEDED: gabapentin (NEURONTIN) levalbuterol (XOPENEX HFA) loratadine (CLARITIN) rizatriptan (MAXALT)    As of today, STOP taking any Aspirin (unless otherwise instructed by your surgeon) Aleve, Naproxen, Ibuprofen, Motrin, Advil, Goody's, BC's, all herbal medications, fish oil, and all vitamins.     After your COVID test   You are not required to quarantine however you are required to wear a well-fitting mask when you are out and around people not in your household.  If your mask becomes wet or soiled, replace with a new one.  Wash your hands often with soap and water for 20 seconds or clean your hands with an alcohol-based hand sanitizer that contains at least 60% alcohol.  Do not share personal items.  Notify your provider: if you are in close contact with someone who has COVID  or if you develop a fever of 100.4 or greater, sneezing, cough, sore throat, shortness of breath or body aches.             Do not wear jewelry or makeup Do not wear lotions, powders, perfumes  or deodorant. Do not shave 48 hours prior to surgery. Do not bring valuables to the hospital. DO Not wear nail polish, gel polish, artificial nails, or any  other type of covering on natural nails including finger and toenails. If patients have artificial nails, gel coating, etc. that need to be removed by a nail salon, please have this removed prior to surgery or surgery may need to be canceled/delayed if the surgeon/ anesthesia feels like the patient is unable to be adequately monitored.             [facility] is not responsible for any belongings or valuables.  Do NOT Smoke (Tobacco/Vaping)  24 hours prior to your procedure  If you use a CPAP at night, you may bring your mask for your overnight stay.   Contacts, glasses, hearing aids, dentures or partials may not be worn into surgery, please bring cases for these belongings   For patients admitted to the hospital, discharge time will be determined by your treatment team.   Patients discharged the day of surgery will not be allowed to drive home, and someone needs to stay with them for 24 hours.  NO VISITORS WILL BE ALLOWED IN PRE-OP WHERE PATIENTS ARE PREPPED FOR SURGERY.  ONLY 1 SUPPORT PERSON MAY BE PRESENT IN THE WAITING ROOM WHILE YOU ARE IN SURGERY.  IF YOU ARE TO BE ADMITTED, ONCE YOU ARE IN YOUR ROOM YOU WILL BE ALLOWED TWO (2) VISITORS. 1 (ONE) VISITOR MAY STAY OVERNIGHT BUT MUST ARRIVE TO THE ROOM BY 8pm.  Minor children may have two parents present. Special consideration for safety and communication  needs will be reviewed on a case by case basis.  Special instructions:    Oral Hygiene is also important to reduce your risk of infection.  Remember - BRUSH YOUR TEETH THE MORNING OF SURGERY WITH YOUR REGULAR TOOTHPASTE   [facility]- Preparing For Surgery  Before surgery, you can play an important role. Because skin is not sterile, your skin needs to be as free of germs as possible. You can reduce the number of germs on your skin by washing with CHG (chlorahexidine gluconate) Soap before surgery.  CHG is an antiseptic cleaner which kills germs and bonds with the skin to continue killing  germs even after washing.     Please do not use if you have an allergy to CHG or antibacterial soaps. If your skin becomes reddened/irritated stop using the CHG.  Do not shave (including legs and underarms) for at least 48 hours prior to first CHG shower. It is OK to shave your face.  Please follow these instructions carefully.     Shower the NIGHT BEFORE SURGERY and the MORNING OF SURGERY with CHG Soap.   If you chose to wash your hair, wash your hair first as usual with your normal shampoo. After you shampoo, rinse your hair and body thoroughly to remove the shampoo.  Then [employer] and genitals (private parts) with your normal soap and rinse thoroughly to remove soap.  After that Use CHG Soap as you would any other liquid soap. You can apply CHG directly to the skin and wash gently with a scrungie or a clean washcloth.   Apply the CHG Soap to your body ONLY FROM THE NECK DOWN.  Do not use on open wounds or open sores. Avoid contact with your eyes, ears, mouth and genitals (private parts). Wash Face and genitals (private parts)  with your normal soap.   Wash thoroughly, paying special attention to the area where your surgery will be performed.  Thoroughly rinse your body with warm water from the neck down.  DO NOT shower/wash with your normal soap after using and rinsing off the CHG Soap.  Pat yourself dry with a CLEAN TOWEL.  Wear CLEAN PAJAMAS to bed the night before surgery  Place CLEAN SHEETS on your bed the night before your surgery  DO NOT SLEEP WITH PETS.   Day of Surgery:  Take a shower with CHG soap. Wear Clean/Comfortable clothing the morning of surgery Do not apply any deodorants/lotions.   Remember to brush your teeth WITH YOUR REGULAR TOOTHPASTE.   Please read over the following fact sheets that you were given.

## 2021-08-04 ENCOUNTER — Encounter (HOSPITAL_COMMUNITY)
Admission: RE | Admit: 2021-08-04 | Discharge: 2021-08-04 | Disposition: A | Discharge status: Home / Self Care | Payer: BC Managed Care – PPO | Source: Ambulatory Visit | Attending: Specialist | Admitting: Specialist

## 2021-08-04 ENCOUNTER — Encounter: Payer: Self-pay | Admitting: Surgery

## 2021-08-04 ENCOUNTER — Encounter (HOSPITAL_COMMUNITY): Payer: Self-pay

## 2021-08-04 ENCOUNTER — Other Ambulatory Visit: Payer: Self-pay

## 2021-08-04 ENCOUNTER — Ambulatory Visit (INDEPENDENT_AMBULATORY_CARE_PROVIDER_SITE_OTHER): Payer: BC Managed Care – PPO | Admitting: Surgery

## 2021-08-04 VITALS — BP 120/80 | HR 71 | Temp 98.2°F | Resp 18 | Ht 66.0 in | Wt 174.5 lb

## 2021-08-04 VITALS — BP 108/79 | HR 73 | Ht 66.0 in | Wt 174.5 lb

## 2021-08-04 DIAGNOSIS — M5010 Cervical disc disorder with radiculopathy, unspecified cervical region: Secondary | ICD-10-CM

## 2021-08-04 DIAGNOSIS — Z01818 Encounter for other preprocedural examination: Secondary | ICD-10-CM | POA: Diagnosis present

## 2021-08-04 HISTORY — DX: Dyspnea, unspecified: R06.00

## 2021-08-04 HISTORY — DX: Other specified postprocedural states: Z98.890

## 2021-08-04 HISTORY — DX: Anxiety disorder, unspecified: F41.9

## 2021-08-04 LAB — CBC
HCT: 43 % (ref 36.0–46.0)
Hemoglobin: 13.7 g/dL (ref 12.0–15.0)
MCH: 27.8 pg (ref 26.0–34.0)
MCHC: 31.9 g/dL (ref 30.0–36.0)
MCV: 87.2 fL (ref 80.0–100.0)
Platelets: 347 10*3/uL (ref 150–400)
RBC: 4.93 MIL/uL (ref 3.87–5.11)
RDW: 13.6 % (ref 11.5–15.5)
WBC: 6 10*3/uL (ref 4.0–10.5)
nRBC: 0 % (ref 0.0–0.2)

## 2021-08-04 LAB — COMPREHENSIVE METABOLIC PANEL
ALT: 19 U/L (ref 0–44)
AST: 21 U/L (ref 15–41)
Albumin: 4 g/dL (ref 3.5–5.0)
Alkaline Phosphatase: 47 U/L (ref 38–126)
Anion gap: 8 (ref 5–15)
BUN: 7 mg/dL (ref 6–20)
CO2: 25 mmol/L (ref 22–32)
Calcium: 9.3 mg/dL (ref 8.9–10.3)
Chloride: 107 mmol/L (ref 98–111)
Creatinine, Ser: 0.73 mg/dL (ref 0.44–1.00)
GFR, Estimated: >60 mL/min (ref >60)
Glucose, Bld: 94 mg/dL (ref 70–99)
Potassium: 4 mmol/L (ref 3.5–5.1)
Sodium: 140 mmol/L (ref 135–145)
Total Bilirubin: 0.5 mg/dL (ref 0.3–1.2)
Total Protein: 6.5 g/dL (ref 6.5–8.1)

## 2021-08-04 LAB — PROTIME-INR
INR: 1 (ref 0.8–1.2)
Prothrombin Time: 13.1 seconds (ref 11.4–15.2)

## 2021-08-04 LAB — TYPE AND SCREEN
ABO/RH(D): A POS
Antibody Screen: NEGATIVE

## 2021-08-04 LAB — SURGICAL PCR SCREEN
MRSA, PCR: NEGATIVE
Staphylococcus aureus: POSITIVE — AB

## 2021-08-04 NOTE — Progress Notes (Signed)
PCP - Hilts, Michael, MD Cardiologist - denies  PPM/ICD - denies Device Orders - n/a Rep Notified - n/a  Chest x-ray - 08/22/2019 EKG - 08/04/2021 Stress Test - denies ECHO - denies Cardiac Cath - denies  Sleep Study - denies CPAP - n/a  Fasting Blood Sugar - n/a  Blood Thinner Instructions: n/a  Aspirin Instructions: Patient was instructed: As of today, STOP taking any Aspirin (unless otherwise instructed by your surgeon) Aleve, Naproxen, Ibuprofen, Motrin, Advil, Goody's, BC's, all herbal medications, fish oil, and all vitamins  ERAS Protcol - yes PRE-SURGERY Ensure - yes  COVID TEST- no, the test will be done on 08/05/2021 @ 10:45   Anesthesia review: no  Patient denies shortness of breath, fever, cough and chest pain at PAT appointment   All instructions explained to the patient, with a verbal understanding of the material. Patient agrees to go over the instructions while at home for a better understanding. Patient also instructed to self quarantine after being tested for COVID-19. The opportunity to ask questions was provided.

## 2021-08-05 ENCOUNTER — Telehealth: Payer: Self-pay | Admitting: Specialist

## 2021-08-05 ENCOUNTER — Other Ambulatory Visit (HOSPITAL_COMMUNITY)
Admission: RE | Admit: 2021-08-05 | Discharge: 2021-08-05 | Disposition: A | Discharge status: Home / Self Care | Payer: BC Managed Care – PPO | Source: Ambulatory Visit | Attending: Specialist | Admitting: Specialist

## 2021-08-05 DIAGNOSIS — Z20822 Contact with and (suspected) exposure to covid-19: Secondary | ICD-10-CM | POA: Insufficient documentation

## 2021-08-05 DIAGNOSIS — Z01812 Encounter for preprocedural laboratory examination: Secondary | ICD-10-CM | POA: Diagnosis present

## 2021-08-05 DIAGNOSIS — Z01818 Encounter for other preprocedural examination: Secondary | ICD-10-CM

## 2021-08-05 LAB — SARS CORONAVIRUS 2 (TAT 6-24 HRS): SARS Coronavirus 2: NEGATIVE

## 2021-08-05 NOTE — Telephone Encounter (Signed)
Pt submitted medical release form, short term disability, and $25.00 cash payment to [employer]. Accepted 08/05/21

## 2021-08-08 NOTE — H&P (Signed)
Lindsey Graham is an 45 y.o. female.   Chief Complaint: neck pain and UE radiculopathy HPI: patient with hx of C4-5 and C5-6 HNP/stenosis and the above complaint presents for preop evaluation.  Symptoms unchanged from previous visit.  History and physical performed. Wants to proceed with C4-5 and C5-6 ACDF as scheduled.    Past Medical History:  Diagnosis Date   Anxiety    Dyspnea    with COVID   PONV (postoperative nausea and vomiting)     Past Surgical History:  Procedure Laterality Date   EYE SURGERY      Family History  Problem Relation Age of Onset   COPD Father    Social History:  reports that she has quit smoking. Her smoking use included e-cigarettes. She uses smokeless tobacco. She reports that she does not drink alcohol and does not use drugs.  Allergies: No Known Allergies  No medications prior to admission.    No results found for this or any previous visit (from the past 48 hour(s)). No results found.  Review of Systems  Constitutional:  Positive for activity change.  HENT: Negative.    Respiratory: Negative.    Cardiovascular: Negative.   Gastrointestinal: Negative.   Genitourinary: Negative.   Musculoskeletal:  Positive for neck pain and neck stiffness.  Neurological:  Positive for numbness.  Psychiatric/Behavioral: Negative.     There were no vitals taken for this visit. Physical Exam HENT:     Head: Normocephalic and atraumatic.     Nose: Nose normal.  Eyes:     Extraocular Movements: Extraocular movements intact.  Cardiovascular:     Heart sounds: Normal heart sounds.  Pulmonary:     Effort: Pulmonary effort is normal. No respiratory distress.     Breath sounds: Normal breath sounds.  Abdominal:     General: There is no distension.  Musculoskeletal:        General: Tenderness present.  Neurological:     Mental Status: She is alert and oriented to person, place, and time.  Psychiatric:        Mood and Affect: Mood normal.      Assessment/Plan C4-5 and C5-6 HNP/stenosis  Will proceed with C4-5 and C5-6 ACDF as scheduled.  Surgical procedure discussed along with potential recovery time.  All questions answered and wishes to proceed.   Zonia Kief, PA-C 08/08/2021, 7:53 PM

## 2021-08-09 ENCOUNTER — Ambulatory Visit (HOSPITAL_COMMUNITY): Payer: BC Managed Care – PPO | Admitting: Vascular Surgery

## 2021-08-09 ENCOUNTER — Ambulatory Visit (HOSPITAL_COMMUNITY)
Admission: RE | Disposition: A | Discharge status: Home / Self Care | Payer: Self-pay | Source: Home / Self Care | Attending: Specialist

## 2021-08-09 ENCOUNTER — Ambulatory Visit (HOSPITAL_COMMUNITY): Payer: BC Managed Care – PPO

## 2021-08-09 ENCOUNTER — Ambulatory Visit (HOSPITAL_COMMUNITY)
Admission: RE | Admit: 2021-08-09 | Discharge: 2021-08-10 | Disposition: A | Discharge status: Home / Self Care | Payer: BC Managed Care – PPO | Attending: Specialist | Admitting: Specialist

## 2021-08-09 ENCOUNTER — Encounter (HOSPITAL_COMMUNITY): Payer: Self-pay | Admitting: Specialist

## 2021-08-09 ENCOUNTER — Other Ambulatory Visit: Payer: Self-pay

## 2021-08-09 DIAGNOSIS — M4722 Other spondylosis with radiculopathy, cervical region: Secondary | ICD-10-CM

## 2021-08-09 DIAGNOSIS — M5010 Cervical disc disorder with radiculopathy, unspecified cervical region: Secondary | ICD-10-CM | POA: Diagnosis present

## 2021-08-09 DIAGNOSIS — Z981 Arthrodesis status: Secondary | ICD-10-CM | POA: Diagnosis present

## 2021-08-09 DIAGNOSIS — Z87891 Personal history of nicotine dependence: Secondary | ICD-10-CM | POA: Diagnosis not present

## 2021-08-09 DIAGNOSIS — M50121 Cervical disc disorder at C4-C5 level with radiculopathy: Secondary | ICD-10-CM | POA: Diagnosis not present

## 2021-08-09 DIAGNOSIS — Z419 Encounter for procedure for purposes other than remedying health state, unspecified: Secondary | ICD-10-CM

## 2021-08-09 DIAGNOSIS — M4802 Spinal stenosis, cervical region: Secondary | ICD-10-CM | POA: Diagnosis not present

## 2021-08-09 DIAGNOSIS — Z01818 Encounter for other preprocedural examination: Secondary | ICD-10-CM

## 2021-08-09 HISTORY — PX: ANTERIOR CERVICAL DECOMP/DISCECTOMY FUSION: SHX1161

## 2021-08-09 LAB — ABO/RH: ABO/RH(D): A POS

## 2021-08-09 LAB — POCT PREGNANCY, URINE: Preg Test, Ur: NEGATIVE

## 2021-08-09 SURGERY — ANTERIOR CERVICAL DECOMPRESSION/DISCECTOMY FUSION 2 LEVELS
Anesthesia: General

## 2021-08-09 MED ORDER — BUPIVACAINE LIPOSOME 1.3 % IJ SUSP
INTRAMUSCULAR | Status: AC
  Filled 2021-08-09: qty 20

## 2021-08-09 MED ORDER — BUPIVACAINE HCL 0.5 % IJ SOLN
INTRAMUSCULAR | Status: DC | PRN
  Administered 2021-08-09: 20 mL

## 2021-08-09 MED ORDER — OXYCODONE HCL 5 MG/5ML PO SOLN: 5.0000 mg | Freq: Once | ORAL | Status: DC | PRN

## 2021-08-09 MED ORDER — MENTHOL 3 MG MT LOZG: 1.0000 | LOZENGE | OROMUCOSAL | Status: DC | PRN

## 2021-08-09 MED ORDER — CEFAZOLIN SODIUM-DEXTROSE 2-4 GM/100ML-% IV SOLN
2.0000 g | Freq: Three times a day (TID) | INTRAVENOUS | Status: AC
  Administered 2021-08-09 – 2021-08-10 (×2): 2 g via INTRAVENOUS
  Filled 2021-08-09 (×2): qty 100

## 2021-08-09 MED ORDER — CHLORHEXIDINE GLUCONATE 0.12 % MT SOLN
OROMUCOSAL | Status: AC
  Administered 2021-08-09: 12:00:00 15 mL via OROMUCOSAL
  Filled 2021-08-09: qty 15

## 2021-08-09 MED ORDER — SCOPOLAMINE 1 MG/3DAYS TD PT72
MEDICATED_PATCH | TRANSDERMAL | Status: AC
  Administered 2021-08-09: 12:00:00 1.5 mg via TRANSDERMAL
  Filled 2021-08-09: qty 1

## 2021-08-09 MED ORDER — SODIUM CHLORIDE 0.9% FLUSH: 3.0000 mL | Freq: Two times a day (BID) | INTRAVENOUS | Status: DC

## 2021-08-09 MED ORDER — MORPHINE SULFATE (PF) 2 MG/ML IV SOLN: 1.0000 mg | INTRAVENOUS | Status: DC | PRN

## 2021-08-09 MED ORDER — PHENOL 1.4 % MT LIQD: 1.0000 | OROMUCOSAL | Status: DC | PRN

## 2021-08-09 MED ORDER — THROMBIN 20000 UNITS EX SOLR: CUTANEOUS | Status: DC | PRN

## 2021-08-09 MED ORDER — CEFAZOLIN SODIUM-DEXTROSE 2-4 GM/100ML-% IV SOLN
2.0000 g | INTRAVENOUS | Status: AC
  Administered 2021-08-09: 15:00:00 2 g via INTRAVENOUS

## 2021-08-09 MED ORDER — POLYETHYLENE GLYCOL 3350 17 G PO PACK: 17.0000 g | PACK | Freq: Every day | ORAL | Status: DC | PRN

## 2021-08-09 MED ORDER — FENTANYL CITRATE (PF) 250 MCG/5ML IJ SOLN
INTRAMUSCULAR | Status: DC | PRN
  Administered 2021-08-09 (×2): 50 ug via INTRAVENOUS
  Administered 2021-08-09: 100 ug via INTRAVENOUS
  Administered 2021-08-09: 50 ug via INTRAVENOUS

## 2021-08-09 MED ORDER — DOCUSATE SODIUM 100 MG PO CAPS
100.0000 mg | ORAL_CAPSULE | Freq: Two times a day (BID) | ORAL | Status: DC
  Administered 2021-08-09 – 2021-08-10 (×2): 100 mg via ORAL
  Filled 2021-08-09 (×2): qty 1

## 2021-08-09 MED ORDER — SCOPOLAMINE 1 MG/3DAYS TD PT72: 1.0000 | MEDICATED_PATCH | TRANSDERMAL | Status: DC

## 2021-08-09 MED ORDER — ORAL CARE MOUTH RINSE: 15.0000 mL | Freq: Once | OROMUCOSAL | Status: AC

## 2021-08-09 MED ORDER — HYDROCODONE-ACETAMINOPHEN 7.5-325 MG PO TABS
1.0000 | ORAL_TABLET | Freq: Four times a day (QID) | ORAL | Status: DC
  Administered 2021-08-09 – 2021-08-10 (×2): 1 via ORAL
  Filled 2021-08-09 (×2): qty 1

## 2021-08-09 MED ORDER — DEXMEDETOMIDINE (PRECEDEX) IN NS 20 MCG/5ML (4 MCG/ML) IV SYRINGE
PREFILLED_SYRINGE | INTRAVENOUS | Status: DC | PRN
  Administered 2021-08-09 (×3): 4 ug via INTRAVENOUS

## 2021-08-09 MED ORDER — EPHEDRINE SULFATE 50 MG/ML IJ SOLN
INTRAMUSCULAR | Status: DC | PRN
  Administered 2021-08-09: 5 mg via INTRAVENOUS
  Administered 2021-08-09: 10 mg via INTRAVENOUS

## 2021-08-09 MED ORDER — MIDAZOLAM HCL 2 MG/2ML IJ SOLN
INTRAMUSCULAR | Status: AC
  Filled 2021-08-09: qty 2

## 2021-08-09 MED ORDER — SERTRALINE HCL 50 MG PO TABS
50.0000 mg | ORAL_TABLET | Freq: Every day | ORAL | Status: DC
  Administered 2021-08-10: 09:00:00 50 mg via ORAL
  Filled 2021-08-09: qty 1

## 2021-08-09 MED ORDER — SODIUM CHLORIDE 0.9 % IV SOLN: 250.0000 mL | INTRAVENOUS | Status: DC

## 2021-08-09 MED ORDER — METHOCARBAMOL 500 MG PO TABS
500.0000 mg | ORAL_TABLET | Freq: Four times a day (QID) | ORAL | Status: DC | PRN
  Administered 2021-08-09 – 2021-08-10 (×2): 500 mg via ORAL
  Filled 2021-08-09 (×2): qty 1

## 2021-08-09 MED ORDER — CEFAZOLIN SODIUM-DEXTROSE 1-4 GM/50ML-% IV SOLN
INTRAVENOUS | Status: AC
  Filled 2021-08-09: qty 50

## 2021-08-09 MED ORDER — PANTOPRAZOLE SODIUM 40 MG IV SOLR
40.0000 mg | Freq: Every day | INTRAVENOUS | Status: DC
  Administered 2021-08-09: 22:00:00 40 mg via INTRAVENOUS
  Filled 2021-08-09: qty 40

## 2021-08-09 MED ORDER — BISACODYL 5 MG PO TBEC: 5.0000 mg | DELAYED_RELEASE_TABLET | Freq: Every day | ORAL | Status: DC | PRN

## 2021-08-09 MED ORDER — ONDANSETRON HCL 4 MG/2ML IJ SOLN
INTRAMUSCULAR | Status: AC
  Filled 2021-08-09: qty 2

## 2021-08-09 MED ORDER — HYDROMORPHONE HCL 1 MG/ML IJ SOLN
0.2500 mg | INTRAMUSCULAR | Status: DC | PRN
Start: 2021-08-09 — End: 2021-08-09
  Administered 2021-08-09 (×4): 0.5 mg via INTRAVENOUS

## 2021-08-09 MED ORDER — ACETAMINOPHEN 10 MG/ML IV SOLN
INTRAVENOUS | Status: DC | PRN
  Administered 2021-08-09: 1000 mg via INTRAVENOUS

## 2021-08-09 MED ORDER — ACETAMINOPHEN 325 MG PO TABS: 650.0000 mg | ORAL_TABLET | ORAL | Status: DC | PRN

## 2021-08-09 MED ORDER — GABAPENTIN 100 MG PO CAPS
100.0000 mg | ORAL_CAPSULE | Freq: Three times a day (TID) | ORAL | Status: DC | PRN
  Administered 2021-08-09: 23:00:00 200 mg via ORAL
  Filled 2021-08-09: qty 2

## 2021-08-09 MED ORDER — FLEET ENEMA 7-19 GM/118ML RE ENEM: 1.0000 | ENEMA | Freq: Once | RECTAL | Status: DC | PRN

## 2021-08-09 MED ORDER — BUPIVACAINE LIPOSOME 1.3 % IJ SUSP
10.0000 mL | Freq: Once | INTRAMUSCULAR | Status: DC
  Filled 2021-08-09: qty 10

## 2021-08-09 MED ORDER — THROMBIN 20000 UNITS EX KIT
PACK | CUTANEOUS | Status: AC
  Filled 2021-08-09: qty 1

## 2021-08-09 MED ORDER — ONDANSETRON HCL 4 MG/2ML IJ SOLN
INTRAMUSCULAR | Status: DC | PRN
  Administered 2021-08-09: 4 mg via INTRAVENOUS

## 2021-08-09 MED ORDER — ONDANSETRON HCL 4 MG/2ML IJ SOLN
4.0000 mg | Freq: Once | INTRAMUSCULAR | Status: AC | PRN
  Administered 2021-08-09: 18:00:00 4 mg via INTRAVENOUS

## 2021-08-09 MED ORDER — VITAMIN D 25 MCG (1000 UNIT) PO TABS: 10000.0000 [IU] | ORAL_TABLET | Freq: Every day | ORAL | Status: DC

## 2021-08-09 MED ORDER — LORATADINE 10 MG PO TABS: 10.0000 mg | ORAL_TABLET | Freq: Every day | ORAL | Status: DC | PRN

## 2021-08-09 MED ORDER — ACETAMINOPHEN 650 MG RE SUPP: 650.0000 mg | RECTAL | Status: DC | PRN

## 2021-08-09 MED ORDER — MIDAZOLAM HCL 2 MG/2ML IJ SOLN
INTRAMUSCULAR | Status: DC | PRN
  Administered 2021-08-09: 2 mg via INTRAVENOUS

## 2021-08-09 MED ORDER — METHOCARBAMOL 1000 MG/10ML IJ SOLN
500.0000 mg | Freq: Four times a day (QID) | INTRAVENOUS | Status: DC | PRN
  Filled 2021-08-09: qty 5

## 2021-08-09 MED ORDER — ONDANSETRON HCL 4 MG/2ML IJ SOLN: 4.0000 mg | Freq: Four times a day (QID) | INTRAMUSCULAR | Status: DC | PRN

## 2021-08-09 MED ORDER — BUPIVACAINE LIPOSOME 1.3 % IJ SUSP
INTRAMUSCULAR | Status: DC | PRN
  Administered 2021-08-09: 20 mL

## 2021-08-09 MED ORDER — PROPOFOL 10 MG/ML IV BOLUS
INTRAVENOUS | Status: DC | PRN
  Administered 2021-08-09: 150 mg via INTRAVENOUS

## 2021-08-09 MED ORDER — SUGAMMADEX SODIUM 200 MG/2ML IV SOLN
INTRAVENOUS | Status: DC | PRN
  Administered 2021-08-09: 157 mg via INTRAVENOUS

## 2021-08-09 MED ORDER — DEXAMETHASONE SODIUM PHOSPHATE 10 MG/ML IJ SOLN
INTRAMUSCULAR | Status: DC | PRN
  Administered 2021-08-09: 10 mg via INTRAVENOUS

## 2021-08-09 MED ORDER — ROCURONIUM BROMIDE 10 MG/ML (PF) SYRINGE
PREFILLED_SYRINGE | INTRAVENOUS | Status: DC | PRN
  Administered 2021-08-09: 30 mg via INTRAVENOUS
  Administered 2021-08-09: 10 mg via INTRAVENOUS
  Administered 2021-08-09: 70 mg via INTRAVENOUS

## 2021-08-09 MED ORDER — LACTATED RINGERS IV SOLN: INTRAVENOUS | Status: DC

## 2021-08-09 MED ORDER — CHLORHEXIDINE GLUCONATE 0.12 % MT SOLN: 15.0000 mL | Freq: Once | OROMUCOSAL | Status: AC

## 2021-08-09 MED ORDER — HYDROMORPHONE HCL 1 MG/ML IJ SOLN
INTRAMUSCULAR | Status: AC
  Filled 2021-08-09: qty 1

## 2021-08-09 MED ORDER — 0.9 % SODIUM CHLORIDE (POUR BTL) OPTIME
TOPICAL | Status: DC | PRN
  Administered 2021-08-09: 14:00:00 1000 mL

## 2021-08-09 MED ORDER — KETOROLAC TROMETHAMINE 15 MG/ML IJ SOLN
15.0000 mg | Freq: Four times a day (QID) | INTRAMUSCULAR | Status: DC
  Administered 2021-08-09 – 2021-08-10 (×3): 15 mg via INTRAVENOUS
  Filled 2021-08-09 (×3): qty 1

## 2021-08-09 MED ORDER — ACETAMINOPHEN 10 MG/ML IV SOLN
INTRAVENOUS | Status: AC
  Filled 2021-08-09: qty 100

## 2021-08-09 MED ORDER — DOCUSATE SODIUM 100 MG PO CAPS: 300.0000 mg | ORAL_CAPSULE | Freq: Every day | ORAL | Status: DC | PRN

## 2021-08-09 MED ORDER — SODIUM CHLORIDE 0.9 % IV SOLN: INTRAVENOUS | Status: DC

## 2021-08-09 MED ORDER — SUMATRIPTAN SUCCINATE 50 MG PO TABS
50.0000 mg | ORAL_TABLET | ORAL | Status: DC | PRN
  Filled 2021-08-09: qty 1

## 2021-08-09 MED ORDER — HYDROCODONE-ACETAMINOPHEN 7.5-325 MG PO TABS
2.0000 | ORAL_TABLET | ORAL | Status: DC | PRN
  Administered 2021-08-10 (×2): 2 via ORAL
  Filled 2021-08-09 (×2): qty 2

## 2021-08-09 MED ORDER — BUPIVACAINE HCL (PF) 0.5 % IJ SOLN
INTRAMUSCULAR | Status: AC
  Filled 2021-08-09: qty 30

## 2021-08-09 MED ORDER — LEVALBUTEROL TARTRATE 45 MCG/ACT IN AERO: 2.0000 | INHALATION_SPRAY | Freq: Four times a day (QID) | RESPIRATORY_TRACT | Status: DC | PRN

## 2021-08-09 MED ORDER — OXYCODONE HCL 5 MG PO TABS: 5.0000 mg | ORAL_TABLET | Freq: Once | ORAL | Status: DC | PRN

## 2021-08-09 MED ORDER — SODIUM CHLORIDE 0.9% FLUSH: 3.0000 mL | INTRAVENOUS | Status: DC | PRN

## 2021-08-09 MED ORDER — ALUM & MAG HYDROXIDE-SIMETH 200-200-20 MG/5ML PO SUSP: 30.0000 mL | Freq: Four times a day (QID) | ORAL | Status: DC | PRN

## 2021-08-09 MED ORDER — FENTANYL CITRATE (PF) 250 MCG/5ML IJ SOLN
INTRAMUSCULAR | Status: AC
  Filled 2021-08-09: qty 5

## 2021-08-09 MED ORDER — LIDOCAINE 2% (20 MG/ML) 5 ML SYRINGE
INTRAMUSCULAR | Status: DC | PRN
  Administered 2021-08-09: 80 mg via INTRAVENOUS

## 2021-08-09 MED ORDER — ONDANSETRON HCL 4 MG PO TABS: 4.0000 mg | ORAL_TABLET | Freq: Four times a day (QID) | ORAL | Status: DC | PRN

## 2021-08-09 MED ORDER — HYDROCODONE-ACETAMINOPHEN 7.5-325 MG PO TABS: 1.0000 | ORAL_TABLET | ORAL | Status: DC | PRN

## 2021-08-09 SURGICAL SUPPLY — 77 items
BAG COUNTER SPONGE SURGICOUNT (BAG) ×2 IMPLANT
BAG SURGICOUNT SPONGE COUNTING (BAG) ×1
BENZOIN TINCTURE PRP APPL 2/3 (GAUZE/BANDAGES/DRESSINGS) ×3 IMPLANT
BIT DRILL SRG 14X2.2XFLT CHK (BIT) IMPLANT
BIT DRL SRG 14X2.2XFLT CHK (BIT) ×1
BLADE AVERAGE 25MMX9MM (BLADE)
BLADE AVERAGE 25X9 (BLADE) IMPLANT
BLADE CLIPPER SURG (BLADE) IMPLANT
BONE CERV LORDOTIC 14.5X12X7 (Bone Implant) ×6 IMPLANT
BONE VIVIGEN FORMABLE 1.3CC (Bone Implant) ×3 IMPLANT
BUR MATCHSTICK NEURO 3.0 LAGG (BURR) IMPLANT
BUR RND FLUTED 2.5 (BURR) IMPLANT
BUR SABER RD CUTTING 3.0 (BURR) IMPLANT
BUR SABER RD CUTTING 3.0MM (BURR)
CLOSURE STERI-STRIP 1/2X4 (GAUZE/BANDAGES/DRESSINGS) ×1
CLOSURE WOUND 1/2 X4 (GAUZE/BANDAGES/DRESSINGS) ×1
CLSR STERI-STRIP ANTIMIC 1/2X4 (GAUZE/BANDAGES/DRESSINGS) ×1 IMPLANT
COVER SURGICAL LIGHT HANDLE (MISCELLANEOUS) ×3 IMPLANT
DERMABOND ADVANCED (GAUZE/BANDAGES/DRESSINGS) ×2
DERMABOND ADVANCED .7 DNX12 (GAUZE/BANDAGES/DRESSINGS) ×1 IMPLANT
DRAIN TLS ROUND 10FR (DRAIN) IMPLANT
DRAPE C-ARM 42X72 X-RAY (DRAPES) ×3 IMPLANT
DRAPE MICROSCOPE LEICA (MISCELLANEOUS) ×3 IMPLANT
DRAPE POUCH INSTRU U-SHP 10X18 (DRAPES) ×3 IMPLANT
DRAPE SURG 17X23 STRL (DRAPES) ×27 IMPLANT
DRESSING MEPILEX FLEX 4X4 (GAUZE/BANDAGES/DRESSINGS) IMPLANT
DRILL BIT SKYLINE 14MM (BIT) ×3
DRSG MEPILEX BORDER 4X4 (GAUZE/BANDAGES/DRESSINGS) IMPLANT
DRSG MEPILEX FLEX 4X4 (GAUZE/BANDAGES/DRESSINGS) ×3
DURAPREP 6ML APPLICATOR 50/CS (WOUND CARE) ×3 IMPLANT
ELECT BLADE 4.0 EZ CLEAN MEGAD (MISCELLANEOUS)
ELECT COATED BLADE 2.86 ST (ELECTRODE) ×3 IMPLANT
ELECT REM PT RETURN 9FT ADLT (ELECTROSURGICAL) ×3
ELECTRODE BLDE 4.0 EZ CLN MEGD (MISCELLANEOUS) IMPLANT
ELECTRODE REM PT RTRN 9FT ADLT (ELECTROSURGICAL) ×1 IMPLANT
GAUZE SPONGE 4X4 12PLY STRL (GAUZE/BANDAGES/DRESSINGS) ×2 IMPLANT
GLOVE SRG 8 PF TXTR STRL LF DI (GLOVE) ×1 IMPLANT
GLOVE SURG 8.5 LATEX PF (GLOVE) ×3 IMPLANT
GLOVE SURG LTX SZ9 (GLOVE) ×3 IMPLANT
GLOVE SURG ORTHO LTX SZ7.5 (GLOVE) ×3 IMPLANT
GLOVE SURG UNDER POLY LF SZ8 (GLOVE) ×3
GOWN STRL REUS W/ TWL LRG LVL3 (GOWN DISPOSABLE) ×1 IMPLANT
GOWN STRL REUS W/TWL 2XL LVL3 (GOWN DISPOSABLE) ×6 IMPLANT
GOWN STRL REUS W/TWL LRG LVL3 (GOWN DISPOSABLE) ×3
GRAFT BNE MATRIX VG FRMBL SM 1 (Bone Implant) IMPLANT
GRAFT BNE SPCR VG2 14.5X12X7 (Bone Implant) IMPLANT
HALTER HD/CHIN CERV TRACTION D (MISCELLANEOUS) ×3 IMPLANT
KIT BASIN OR (CUSTOM PROCEDURE TRAY) ×3 IMPLANT
KIT TURNOVER KIT B (KITS) ×3 IMPLANT
MANIFOLD NEPTUNE II (INSTRUMENTS) ×3 IMPLANT
NDL SPNL 20GX3.5 QUINCKE YW (NEEDLE) ×2 IMPLANT
NEEDLE SPNL 20GX3.5 QUINCKE YW (NEEDLE) ×6 IMPLANT
NS IRRIG 1000ML POUR BTL (IV SOLUTION) ×3 IMPLANT
PACK ORTHO CERVICAL (CUSTOM PROCEDURE TRAY) ×3 IMPLANT
PAD ARMBOARD 7.5X6 YLW CONV (MISCELLANEOUS) ×6 IMPLANT
PATTIES SURGICAL .5 X.5 (GAUZE/BANDAGES/DRESSINGS) IMPLANT
PIN DISTRACTION 14MM (PIN) ×6 IMPLANT
PLATE TWO LEVEL SKYLINE 30MM (Plate) ×2 IMPLANT
RESTRAINT LIMB HOLDER UNIV (RESTRAINTS) ×3 IMPLANT
SCREW VAR SELF TAP SKYLINE 14M (Screw) ×12 IMPLANT
SPONGE INTESTINAL PEANUT (DISPOSABLE) IMPLANT
SPONGE SURGIFOAM ABS GEL 100 (HEMOSTASIS) IMPLANT
SPONGE T-LAP 4X18 ~~LOC~~+RFID (SPONGE) IMPLANT
STRIP CLOSURE SKIN 1/2X4 (GAUZE/BANDAGES/DRESSINGS) ×2 IMPLANT
SUT ETHILON 4 0 PS 2 18 (SUTURE) ×3 IMPLANT
SUT VIC AB 2-0 CT1 27 (SUTURE) ×6
SUT VIC AB 2-0 CT1 TAPERPNT 27 (SUTURE) ×1 IMPLANT
SUT VIC AB 3-0 X1 27 (SUTURE) ×5 IMPLANT
SUT VICRYL 4-0 PS2 18IN ABS (SUTURE) IMPLANT
SYR 20ML LL LF (SYRINGE) ×3 IMPLANT
SYR 30ML LL (SYRINGE) ×3 IMPLANT
SYSTEM CHEST DRAIN TLS 7FR (DRAIN) IMPLANT
TOWEL GREEN STERILE (TOWEL DISPOSABLE) ×3 IMPLANT
TOWEL GREEN STERILE FF (TOWEL DISPOSABLE) ×3 IMPLANT
TRAY FOLEY MTR SLVR 16FR STAT (SET/KITS/TRAYS/PACK) ×3 IMPLANT
TRAY FOLEY W/BAG SLVR 14FR (SET/KITS/TRAYS/PACK) ×3 IMPLANT
WATER STERILE IRR 1000ML POUR (IV SOLUTION) ×3 IMPLANT

## 2021-08-09 NOTE — Anesthesia Procedure Notes (Signed)
Procedure Name: Intubation Date/Time: 08/09/2021 2:24 PM Performed by: Lendon Ka, CRNA Pre-anesthesia Checklist: Patient identified, Emergency Drugs available, Suction available and Patient being monitored Patient Re-evaluated:Patient Re-evaluated prior to induction Oxygen Delivery Method: Circle System Utilized Preoxygenation: Pre-oxygenation with 100% oxygen Induction Type: IV induction Ventilation: Mask ventilation without difficulty Laryngoscope Size: Glidescope and 3 Grade View: Grade I Tube type: Oral Number of attempts: 1 Airway Equipment and Method: Stylet Placement Confirmation: ETT inserted through vocal cords under direct vision, positive ETCO2 and breath sounds checked- equal and bilateral Secured at: 21 cm Tube secured with: Tape Dental Injury: Teeth and Oropharynx as per pre-operative assessment  Comments: Elective

## 2021-08-09 NOTE — Discharge Instructions (Signed)
No lifting greater than 10 lbs. No overhead use of arms. °Avoid bending,and twisting neck. °Walk in house for first week them may start to get out slowly increasing distance up to one mile by 3 weeks post op. °Keep incision dry for 3 days, may then bathe and wet incision using a [place] collar when showering. °Call if any fevers >101, chills, or increasing numbness or weakness or increased swelling or drainage. ° °

## 2021-08-09 NOTE — Brief Op Note (Signed)
PATIENT ID:      Lindsey Graham  MRN:     [id number] DOB/AGE:    1976-04-02 / 45 y.o.       OPERATIVE REPORT   DATE OF PROCEDURE:  08/09/2021      PREOPERATIVE DIAGNOSIS:   C4-5 spondylosis, degenerative disc disease, C5-6 herniated cervical disc, degenerative disc disease                                                       Body mass index is 27.92 kg/m.    POSTOPERATIVE DIAGNOSIS:   C4-5 spondylosis, degenerative disc disease, C5-6 herniated cervical disc, degenerative disc disease                                                                     Body mass index is 27.92 kg/m.    PROCEDURE:  Procedure(s): ANTERIOR CERVICAL DISCECTOMY FUSION C4-5 AND C5-6 WITH PLATE, SCREWS, ALLOGRAFT, LOCAL BONE GRAFT, VIVIGEN    SURGEON: [name]   ASSISTANT: Andee Lineman          ANESTHESIA:  General and local subcutaneous infiltration with marcaine 0.5% 1:1 exparel 1.3% total 10cc  EBL: 75cc  DRAINS: 7 Fr TLS anterior neck                 Foley to SD  COMPONENTS:   Implant Name Type Inv. Item Serial No. Manufacturer Lot No. LRB No. Used Action  BONE VIVIGEN FORMABLE 1.3CC - [id number] Bone Implant BONE VIVIGEN FORMABLE 1.3CC [id number] [facility]  N/A 1 Implanted  BONE CERV LORDOTIC [id number] - [id number] Bone Implant BONE CERV LORDOTIC [id number] [facility]  N/A 1 Implanted  BONE CERV LORDOTIC 60.1U93A3 - [id number] Bone Implant BONE CERV LORDOTIC [id number] [employer]  N/A 1 Implanted  PLATE TWO LEVEL SKYLINE - [id number] Plate PLATE TWO LEVEL SKYLINE  [employer]  N/A 1 Implanted  SCREW VAR SELF TAP SKYLINE 26M - [id number] Screw SCREW VAR SELF TAP SKYLINE 26M  [employer]  N/A 6 Implanted    COMPLICATIONS:  None   CONDITION:  stable    [name] 08/09/2021, 5:35 PM

## 2021-08-09 NOTE — Anesthesia Preprocedure Evaluation (Signed)
Anesthesia Evaluation  Patient identified by MRN, date of birth, ID band Patient awake    Reviewed: Allergy & Precautions, NPO status , Patient's Chart, lab work & pertinent test results  History of Anesthesia Complications (+) PONV and history of anesthetic complications  Airway Mallampati: I  TM Distance: >3 FB Neck ROM: Full    Dental no notable dental hx. (+) Teeth Intact, Dental Advisory Given, Caps   Pulmonary shortness of breath and with exertion, former smoker,    Pulmonary exam normal breath sounds clear to auscultation       Cardiovascular negative cardio ROS Normal cardiovascular exam Rhythm:Regular Rate:Normal     Neuro/Psych  Headaches, Anxiety    GI/Hepatic negative GI ROS, Neg liver ROS,   Endo/Other  negative endocrine ROS  Renal/GU negative Renal ROS  negative genitourinary   Musculoskeletal Spondylosis C4-5, HNP C5-6 Cervical pain   Abdominal   Peds  Hematology negative hematology ROS (+)   Anesthesia Other Findings   Reproductive/Obstetrics                             Anesthesia Physical Anesthesia Plan  ASA: 2  Anesthesia Plan: General   Post-op Pain Management:    Induction: Intravenous  PONV Risk Score and Plan: Treatment may vary due to age or medical condition, Scopolamine patch - Pre-op, Midazolam, Ondansetron and Dexamethasone  Airway Management Planned: Oral ETT and Video Laryngoscope Planned  Additional Equipment:   Intra-op Plan:   Post-operative Plan: Extubation in OR  Informed Consent: I have reviewed the patients History and Physical, chart, labs and discussed the procedure including the risks, benefits and alternatives for the proposed anesthesia with the patient or authorized representative who has indicated his/her understanding and acceptance.     Dental advisory given  Plan Discussed with: Anesthesiologist and CRNA  Anesthesia Plan  Comments:         Anesthesia Quick Evaluation

## 2021-08-09 NOTE — Progress Notes (Signed)
Orthopedic Tech Progress Note Patient Details:  Lindsey Graham 1975/09/14 [id number]  OR RN called requesting a PHILLY COLLAR for patient dropped off to OR DESK    Ortho Devices Type of Ortho Device: [place] cervical collar Ortho Device/Splint Location: NECK Ortho Device/Splint Interventions: Other (comment)   Post Interventions Patient Tolerated: Other (comment) Instructions Provided: Other (comment)  Donald Pore 08/09/2021, 4:37 PM

## 2021-08-09 NOTE — Interval H&P Note (Signed)
History and Physical Interval Note:  08/09/2021 1:53 PM  Lindsey Graham  has presented today for surgery, with the diagnosis of C4-5 spondylosis, degenerative disc disease, C5-6 herniated cervical disc, degenerative disc disease.  The various methods of treatment have been discussed with the patient and family. After consideration of risks, benefits and other options for treatment, the patient has consented to  Procedure(s): ANTERIOR CERVICAL DISCECTOMY FUSION C4-5 AND C5-6 WITH PLATE, SCREWS, ALLOGRAFT, LOCAL BONE GRAFT, VIVIGEN (N/A) as a surgical intervention.  The patient's history has been reviewed, patient examined, no change in status, stable for surgery.  I have reviewed the patient's chart and labs.  Questions were answered to the patient's satisfaction.     [name]

## 2021-08-09 NOTE — Op Note (Addendum)
08/09/2021  5:38 PM  PATIENT:  Lindsey Graham  45 y.o. female  MRN: [id number]  OPERATIVE REPORT  PRE-OPERATIVE DIAGNOSIS:  C4-5 spondylosis, degenerative disc disease, C5-6 herniated cervical disc, degenerative disc disease  POST-OPERATIVE DIAGNOSIS:  C4-5 spondylosis, degenerative disc disease, C5-6 herniated cervical disc, degenerative disc disease  PROCEDURE:  Procedure(s): ANTERIOR CERVICAL DISCECTOMY FUSION C4-5 AND C5-6 WITH PLATE, SCREWS, ALLOGRAFT, LOCAL BONE GRAFT, VIVIGEN    SURGEON:  Jessy Oto, MD     ASSISTANT:  Benjiman Core, PA-C  (Present throughout the entire procedure and necessary for completion of procedure in a timely manner)     ANESTHESIA:  General,    COMPLICATIONS:  None.     COMPONENTS:   Implant Name Type Inv. Item Serial No. Manufacturer Lot No. LRB No. Used Action  BONE VIVIGEN FORMABLE 1.3CC - [id number] Bone Implant BONE VIVIGEN FORMABLE 1.3CC [id number] [facility]  N/A 1 Implanted  BONE CERV LORDOTIC [id number] - [id number] Bone Implant BONE CERV LORDOTIC [id number] [facility]  N/A 1 Implanted  BONE CERV LORDOTIC 39.7Q73A1 - [id number] Bone Implant BONE CERV LORDOTIC [id number] [facility]  N/A 1 Implanted  PLATE TWO LEVEL SKYLINE 30MM - [id number] Plate PLATE TWO LEVEL SKYLINE 30MM  [employer]  N/A 1 Implanted  SCREW VAR SELF TAP SKYLINE 12M - [id number] Screw SCREW VAR SELF TAP SKYLINE 12M  [employer]  N/A 6 Implanted    PROCEDURE:The patient was met in the holding area, and the appropriate Left cervical C4-5 and C5-6 level identified and marked with an "X" and my initials. The patient was then transported to OR and was placed on the operative table in a supine position. The patient was then placed under  general anesthesia without difficulty. The patient received appropriate preoperative antibiotic prophylaxis.  . Nursing staff inserted a Foley catheter under sterile  conditionsCervical spine was positioned with a [place] horseshoe and 5 pound cervical halter traction. All pressure points well padded and semi-beach chair position. Arm holder both arms. Standard prep with DuraPrep solution the anterior cervical spine chest. Draped in the usual manner. Iodine vi drape was used. Standard timeout protocol was carried out identifying the patient procedure side of the procedure and level. The skin the left neck was infiltrated with Marcaine 0.5% 1:1 Exparel 1.3% total  10cc. This at the level of expected C5 incision and also along the  Lines of Langer. Incision transverse at the C5 level and carried down to the level of the platysma and then medial to sternocleidomastoid muscle. The interval between the trachea and esophagus medially and the carotid sheath laterally was developed as a Metzenbaum scissors and blunt dissection exposing the anterior aspect of cervical spine at the C5-6 level Attention then turned to the C5-6 and C4-5 levels where an 18-gauge spinal needles were placed with sheath intact allowing only a centimeter to extend into the C5-6 and C4-5 levels and observed on lateral xray at the C5-6  and anterior aspect of C4-5 level. Handheld Cloward retraction of the soft tissues while identifying the level at C5-6 and C4-5 removing a portion of the anterior aspect of the discs with15 blade scalpel and pituitary. Medial border of the longus collie muscles was carefully elevated bilaterally and self-retaining retractors were introduced the foot of the blade beneath the medial border of the longus colli muscles. Soft tissue overlying the anterior borders of the disc space at C5-6 and C4-5 levels carefully debridement of soft tissue back to bony  edges. The anterior lip osteophytes were then resected using rongeur. This bone was preserved for later bone grafting purposes.Distraction pins placed first at the C5-6 level.The C5-6 disc space was then first prepared using loupe  magnification and headlight with resection of degenerative disc annulus anteriorly and posteriorly and cartilaginous endplates using micro-curettes pituitaries and a high-speed bur. The operating room microscope was draped sterilely and brought into the field the C-arm previously had been draped sterilely prior to its use. Under the operating room microscope and posterior aspect of the disc was excised using micro-curettes pituitary rongeur and times per. Posterior lip osteophytes were drilled using a high-speed bur and a carefully resected with 1 and 2 mm Kerrison foraminotomy performed over both C6 nerve roots using 1 and 2 mm Kerrisons disc herniation material was noted centrally and into the left foramen some of which represented reflected portion of the patient's annulus into the neuroforamen. Following decompression of the spinal cord at both C6 nerve roots irrigation was carried out. The endplates of the inferior aspect of C5  and superior aspect of C6 were carefully prepared using a high-speed bur to parallel. The disc space was then sounded to 7 mm utilized and a precontoured lordotic sounder for the ACDF allograft.  Sounding was carried up to a 7 mm implant.  7 mm spacer was felt to provide best fit for the C5-6disc space. The 7 mm ACDF allograft implant was then brought onto the field. The implant was then carefully placed over the intervertebral disc space at C5-6 level. Care taken to ensure that no bone or soft tissue debris within the disc space that could be retropulsed with insertion of  bone graft. Thegraft   was then impacted into place with the head placed in longitudinal cervical traction.  The graft was felt to be in excellent position alignment. Distraction pin at the C6 removed. Attention then turned to the C4-5 level. The Boss McCollough retractor replaced at the C4-5 level. The foot of the blades medial to the longus colli muscles. A distraction screw post placed at the C4 level and  distraction of the disc space performed.The C4-5 disc space was then first prepared [place] with resection of degenerative disc annulus anteriorly and posteriorly and cartilaginous endplates using micro-curettes pituitaries and a high-speed bur. The operating room microscope  was  used. Under the operating room microscope and posterior aspect of the disc was excised using micro-curettes pituitary rongeur and times per. Posterior lip osteophytes were drilled using a high-speed bur and a carefully resected with 1 and 2 mm Kerrison foraminotomy performed over both C5 nerve roots using 1 and 2 mm Kerrisons disc herniation material was noted left centrally and into the left foramen some of which represented reflected portion of the patient's annulus into the neuroforamen. . Following decompression of the spinal cord at both C5 nerve roots, irrigation was carried out. Bleeding controlled using some bone wax excess bone wax removed and Gelfoam thrombin-soaked. The endplates of the inferior aspect of C4 and superior aspect of C5 were carefully prepared using a high-speed bur to parallel. The disc space was then sounded utilized and a precontoured lordotic sounder for the tranzgraft.  Sounding was carried up to a 7 mm implant.  35m spacer was felt to provide best fit for the C4-5 disc space. The 7 mm ACDF allograft implant was then brought onto the field. It was then packed with local bone graft that had been harvested previously. The implant was then carefully placed over  the intervertebral disc space at C4-5 level. Care taken to ensure that no bone or soft tissue debris within the disc space that could be retropulsed with insertion of the bone graft. The graft was then impacted into place with the head placed in longitudinal cervical traction.  The graft was felt to be in excellent position alignment. Distraction pins at the C5 and C4 removed. This point irrigation was carried out cervical incision site and lower cervical  incision site. Esophagus examined and the upper cervical level as well as at the lower cervical level and found to be normal. Irrigation was again carried out there was no active bleeding present.Longitudinal halter traction was discontinued. The length for cervical plate determined with cottonoid string and two bayonet forceps. Anterior lip osteophytes were then resected about the C5-6 and C4-5 levels using a high-speed bur. This area smoothed for application of the plate to the anterior surface of the cervical spine from C4-C6 A 30 mm plate chosen. The plate carefully applied to the anterior cervical spine and aligned. A single 22m screw placed at the right C4 level after drilling to 175m Drilling 14 mm hole right side at the C5 level and a 14 mm screw placed. A drill hole made on the right side at C6 to 14 mm and a 14 mm screw placed. A second screw then placed on the left side at the C4 level and on the left at the C5 and C6 levels. Total of 6 x14 mm screws were placed each locked within the plate quite nicely. Intraoperative lateral radiograph of the cervical spine demonstrates the left C4 screw to be one mm long so it was changed to a revision [id number] and the final radiograph showed the plate and screws with the bone grafts to be in good position alignment without any sign of canal impingement or retropulsion of graft. Irrigation was carried out at cervical incision site and lower cervical incision site. Esophagus examined and the upper cervical level as well as at the lower cervical level and found to be normal.  A 7 [place] drain place along the left anterior cervical spine exiting inferior to the incision medially. The TLS drain sewn in place with 4-0 nylon. The incisions were then closed by approximating the  Omohyoid muscle with 2-0 vicryl then the deep subcutaneous layers the platysma layer with interrupted 2-0 Vicryl suture and the superficial fascia overlying the sternocleidomastoid muscle  with interrupted 3-0 Vicryl sutures. The subcutaneous layers were approximated with interrupted 3-0 Vicryl sutures as were the superficial layers. The skin was closed with a running subcutaneous stitch of 4-0 Vicryl at  the operative C5-7 transverse incision. Skin was approximated with steristrips. Mepilex bandage was applied. A [place] collar was then applied to the cervical spine. drapes were removed. Patient's or table was then returned to the flat position. At the end of the cervical spine surgery case all instrument and sponge counts were correct. Patient was then reactivated extubated and returned to recovery room in satisfactory condition.  JamBenjiman Core-C perform the duties of assistant surgeon performing careful retraction of the esophagus and trachea during the initial exposure and careful suctioning of neural elements cervical nerve roots and cervical cord. He was present from the beginning of case to the end of the case and assisted in positioning of the patient in transfer the patient from bed to the stretcher at the end of the case. He closed the incision on the platysma layer to the skin. Applied  dressing.   Basil Dess 08/09/2021, 5:38 PM

## 2021-08-09 NOTE — Transfer of Care (Signed)
Immediate Anesthesia Transfer of Care Note  Patient: Lindsey Graham  Procedure(s) Performed: ANTERIOR CERVICAL DISCECTOMY FUSION C4-5 AND C5-6 WITH PLATE, SCREWS, ALLOGRAFT, LOCAL BONE GRAFT, VIVIGEN  Patient Location: PACU  Anesthesia Type:General  Level of Consciousness: awake, alert  and patient cooperative  Airway & Oxygen Therapy: Patient Spontanous Breathing and Patient connected to face mask oxygen  Post-op Assessment: Report given to RN, Post -op Vital signs reviewed and stable and Patient moving all extremities X 4  Post vital signs: Reviewed and stable  Last Vitals:  Vitals Value Taken Time  BP 113/67 08/09/21 1739  Temp    Pulse 106 08/09/21 1742  Resp 20 08/09/21 1742  SpO2 96 % 08/09/21 1742  Vitals shown include unvalidated device data.  Last Pain:  Vitals:   08/09/21 1123  TempSrc:   PainSc: 7       Patients Stated Pain Goal: 0 (08/09/21 1123)  Complications: No notable events documented.

## 2021-08-09 NOTE — Progress Notes (Signed)
Orthopedic Tech Progress Note Patient Details:  Lindsey Graham 23-Dec-1975 [id number]  Dropped off an ASPEN CERVICAL COLLAR to PACU   Patient ID: Lindsey Graham, female   DOB: [birth year], 45 y.o.   MRN: [id number]  Lindsey Graham 08/09/2021, 6:12 PM

## 2021-08-10 DIAGNOSIS — M4722 Other spondylosis with radiculopathy, cervical region: Secondary | ICD-10-CM | POA: Diagnosis not present

## 2021-08-10 MED ORDER — HYDROCODONE-ACETAMINOPHEN 7.5-325 MG PO TABS: 1.0000 | ORAL_TABLET | ORAL | 0 refills | Status: DC | PRN

## 2021-08-10 MED ORDER — PHENOL 1.4 % MT LIQD: 1.0000 | OROMUCOSAL | 0 refills | Status: DC | PRN

## 2021-08-10 MED ORDER — METHOCARBAMOL 500 MG PO TABS
500.0000 mg | ORAL_TABLET | Freq: Three times a day (TID) | ORAL | 1 refills | Status: DC | PRN
Start: 2021-08-10 — End: 2021-09-07

## 2021-08-10 MED FILL — Thrombin For Soln Kit 20000 Unit: CUTANEOUS | Qty: 1 | Status: AC

## 2021-08-10 NOTE — Progress Notes (Signed)
Discharge instructions/education/AVS/Rx given to patient and verbalized understanding. All questions answered appropriately and patient satisfied. Pain is mild to moderate and controlled by PRN meds. Mild soreness with swallowing but swallowing well. No drainage, no swelling, no redness noted on incision site. Voiding and emptying bladder well. Ambulating well with supervision. Awaiting for transport.

## 2021-08-10 NOTE — Evaluation (Signed)
Occupational Therapy Evaluation Patient Details Name: Lindsey Graham MRN: [id number] DOB: 04-07-1976 Today's Date: 08/10/2021   History of Present Illness Pt is a 45 y.o. F who presents s/p C4-6 ACDF 08/09/2021. No significant PMH.   Clinical Impression   Pt admitted for procedure listed above. PTA pt reported that she was independent with all ADL's and functional mobility, including working. At this time, pt presents with mild pain. She is able to complete all basic ADL's with no difficulties, following compensatory strategies. Educated on her cervical precautions, pt has no further OT needs and acute OT will sign off.      Recommendations for follow up therapy are one component of a multi-disciplinary discharge planning process, led by the attending physician.  Recommendations may be updated based on patient status, additional functional criteria and insurance authorization.   Follow Up Recommendations  No OT follow up    Assistance Recommended at Discharge None  Functional Status Assessment  Patient has had a recent decline in their functional status and demonstrates the ability to make significant improvements in function in a reasonable and predictable amount of time.  Equipment Recommendations  None recommended by OT    Recommendations for Other Services       Precautions / Restrictions Precautions Precautions: Cervical Precaution Booklet Issued: Yes (comment) Precaution Comments: verbally reviewed and provided written handout, educated on compensatory strategies Required Braces or Orthoses: Cervical Brace Cervical Brace: Hard collar Restrictions Weight Bearing Restrictions: No      Mobility Bed Mobility Overal bed mobility: Modified Independent             General bed mobility comments: cues for log roll technique, HOB elevated ~20 degrees (pt has adjustable bed at home)    Transfers Overall transfer level: Independent Equipment used: None                       Balance Overall balance assessment: Mild deficits observed, not formally tested                                         ADL either performed or assessed with clinical judgement   ADL Overall ADL's : Modified independent                                       General ADL Comments: OVerall indep, following compensatory strategies for basic ADL's.     Vision Baseline Vision/History: 0 No visual deficits Ability to See in Adequate Light: 0 Adequate Patient Visual Report: No change from baseline Vision Assessment?: No apparent visual deficits     Perception     Praxis      Pertinent Vitals/Pain Pain Assessment: 0-10 Pain Score: 6  Faces Pain Scale: Hurts a little bit Pain Location: surgical site Pain Descriptors / Indicators: Operative site guarding Pain Intervention(s): Monitored during session     Hand Dominance Right   Extremity/Trunk Assessment Upper Extremity Assessment Upper Extremity Assessment: Overall WFL for tasks assessed RUE Deficits / Details: 5/5 strength LUE Deficits / Details: 5/5 strength   Lower Extremity Assessment Lower Extremity Assessment: Defer to PT evaluation RLE Deficits / Details: 5/5 strength LLE Deficits / Details: 5/5 strength   Cervical / Trunk Assessment Cervical / Trunk Assessment: Neck Surgery   Communication Communication Communication: No difficulties  Cognition Arousal/Alertness: Awake/alert Behavior During Therapy: WFL for tasks assessed/performed Overall Cognitive Status: Within Functional Limits for tasks assessed                                       General Comments  VSS on RA, Honeycomb dressing intact and dry    Exercises     Shoulder Instructions      Home Living Family/patient expects to be discharged to:: Private residence Living Arrangements: Spouse/significant other;Children (son) Available Help at Discharge: Family Type of Home: House        Home Layout: Two level Alternate Level Stairs-Number of Steps:  (flight) Alternate Level Stairs-Rails: None Bathroom Shower/Tub: Chief Strategy Officer: Handicapped height     Home Equipment: BSC/3in1;Cane - single point          Prior Functioning/Environment Prior Level of Function : Independent/Modified Independent             Mobility Comments: works for [employer] ADLs Comments: indep        OT Problem List: Decreased activity tolerance;Decreased coordination;Pain      OT Treatment/Interventions:      OT Goals(Current goals can be found in the care plan section) Acute Rehab OT Goals Patient Stated Goal: To go home and get in bed OT Goal Formulation: All assessment and education complete, DC therapy Time For Goal Achievement: 08/10/21 Potential to Achieve Goals: Good  OT Frequency:     Barriers to D/C:            Co-evaluation              AM-PAC OT "6 Clicks" Daily Activity     Outcome Measure Help from another person eating meals?: None Help from another person taking care of personal grooming?: None Help from another person toileting, which includes using toliet, bedpan, or urinal?: None Help from another person bathing (including washing, rinsing, drying)?: None Help from another person to put on and taking off regular upper body clothing?: None Help from another person to put on and taking off regular lower body clothing?: None 6 Click Score: 24   End of Session Equipment Utilized During Treatment: Cervical collar Nurse Communication: Mobility status  Activity Tolerance: Patient tolerated treatment well Patient left: in bed  OT Visit Diagnosis: Other abnormalities of gait and mobility (R26.89);Muscle weakness (generalized) (M62.81)                Time: 9021-1155 OT Time Calculation (min): 25 min Charges:  OT General Charges $OT Visit: 1 Visit OT Evaluation $OT Eval Low Complexity: 1 Low OT Treatments $Self Care/Home  Management : 8-22 mins  Ricca Melgarejo H., OTR/L Acute Rehabilitation  [place] Cosma Elane Benyamin Jeff 08/10/2021, 12:09 PM

## 2021-08-10 NOTE — Progress Notes (Signed)
° ° ° °  Subjective: 1 Day Post-Op Procedure(s) (LRB): ANTERIOR CERVICAL DISCECTOMY FUSION C4-5 AND C5-6 WITH PLATE, SCREWS, ALLOGRAFT, LOCAL BONE GRAFT, VIVIGEN (N/A)Awake, alert and oriented x 4. Pain in the neck and scapula and shoulders is gone, pain both arms is better. Up and out of bed walking, foley out and voiding without difficulty. TLS drain anterior neck discontinued.   Patient reports pain as moderate.    Objective:   VITALS:  Temp:  [98 F (36.7 C)-98.5 F (36.9 C)] 98 F (36.7 C) (12/28 0810) Pulse Rate:  [74-115] 87 (12/28 0810) Resp:  [11-20] 18 (12/28 0810) BP: (97-113)/(60-75) 100/70 (12/28 0810) SpO2:  [92 %-98 %] 98 % (12/28 0810) Weight:  [78.5 kg] 78.5 kg (12/27 1112)  Neurologically intact ABD soft Neurovascular intact Sensation intact distally Intact pulses distally Dorsiflexion/Plantar flexion intact Incision: dressing C/D/I and no drainage TLS drain suture removed and drain discontinued New dressing post cleaning skin.   LABS No results for input(s): HGB, WBC, PLT in the last 72 hours. No results for input(s): NA, K, CL, CO2, BUN, CREATININE, GLUCOSE in the last 72 hours. No results for input(s): LABPT, INR in the last 72 hours.   Assessment/Plan: 1 Day Post-Op Procedure(s) (LRB): ANTERIOR CERVICAL DISCECTOMY FUSION C4-5 AND C5-6 WITH PLATE, SCREWS, ALLOGRAFT, LOCAL BONE GRAFT, VIVIGEN (N/A)  Advance diet Up with therapy Discharge home with home health  [name] 08/10/2021, 9:00 AM

## 2021-08-10 NOTE — Evaluation (Signed)
Physical Therapy Evaluation and Discharge Patient Details Name: Vernadine Coombs MRN: [id number] DOB: Feb 10, 1976 Today's Date: 08/10/2021  History of Present Illness  Pt is a 45 y.o. F who presents s/p C4-6 ACDF 08/09/2021. No significant PMH.  Clinical Impression  Patient evaluated by Physical Therapy with no further acute PT needs identified. PTA, pt lives with her husband and son and works for [employer]. Pt reports significant improvement in radicular symptoms and denies numbness/tingling. Cervical brace adjusted for optimal fit. Pt ambulating 400 feet with no assistive device and negotiated 12 steps with no railing to simulate home set up. Education provided regarding cervical precautions, activity recommendations and brace use. All education has been completed and the patient has no further questions. No follow-up Physical Therapy or equipment needs. PT is signing off. Thank you for this referral.      Recommendations for follow up therapy are one component of a multi-disciplinary discharge planning process, led by the attending physician.  Recommendations may be updated based on patient status, additional functional criteria and insurance authorization.  Follow Up Recommendations No PT follow up    Assistance Recommended at Discharge PRN  Functional Status Assessment Patient has had a recent decline in their functional status and demonstrates the ability to make significant improvements in function in a reasonable and predictable amount of time.  Equipment Recommendations  None recommended by PT    Recommendations for Other Services       Precautions / Restrictions Precautions Precautions: Cervical Precaution Booklet Issued: Yes (comment) Precaution Comments: verbally reviewed and provided written handout Required Braces or Orthoses: Cervical Brace Cervical Brace: Hard collar Restrictions Weight Bearing Restrictions: No      Mobility  Bed Mobility Overal bed mobility: Modified  Independent             General bed mobility comments: cues for log roll technique, HOB elevated ~20 degrees (pt has adjustable bed at home)    Transfers Overall transfer level: Independent Equipment used: None                    Ambulation/Gait Ambulation/Gait assistance: Modified independent (Device/Increase time) Gait Distance (Feet): 400 Feet Assistive device: None Gait Pattern/deviations: Step-through pattern;Decreased stride length Gait velocity: decreased     General Gait Details: slower speed, overall steady  Stairs Stairs: Yes Stairs assistance: Min guard Stair Management: No rails Number of Stairs: 12 General stair comments: cues for step by step pattern  Wheelchair Mobility    Modified Rankin (Stroke Patients Only)       Balance Overall balance assessment: Mild deficits observed, not formally tested                                           Pertinent Vitals/Pain Pain Assessment: Faces Faces Pain Scale: Hurts a little bit Pain Location: surgical site Pain Descriptors / Indicators: Operative site guarding Pain Intervention(s): Monitored during session;Premedicated before session    Home Living Family/patient expects to be discharged to:: Private residence Living Arrangements: Spouse/significant other;Children (son) Available Help at Discharge: Family Type of Home: House       Alternate Level Stairs-Number of Steps:  (flight) Home Layout: Two level        Prior Function Prior Level of Function : Independent/Modified Independent             Mobility Comments: works for [employer]  Hand Dominance        Extremity/Trunk Assessment   Upper Extremity Assessment Upper Extremity Assessment: RUE deficits/detail;LUE deficits/detail RUE Deficits / Details: 5/5 strength LUE Deficits / Details: 5/5 strength    Lower Extremity Assessment Lower Extremity Assessment: RLE deficits/detail;LLE  deficits/detail RLE Deficits / Details: 5/5 strength LLE Deficits / Details: 5/5 strength    Cervical / Trunk Assessment Cervical / Trunk Assessment: Neck Surgery  Communication   Communication: No difficulties  Cognition Arousal/Alertness: Awake/alert Behavior During Therapy: WFL for tasks assessed/performed Overall Cognitive Status: Within Functional Limits for tasks assessed                                          General Comments      Exercises     Assessment/Plan    PT Assessment Patient does not need any further PT services  PT Problem List         PT Treatment Interventions      PT Goals (Current goals can be found in the Care Plan section)  Acute Rehab PT Goals Patient Stated Goal: return to work when able PT Goal Formulation: All assessment and education complete, DC therapy    Frequency     Barriers to discharge        Co-evaluation               AM-PAC PT "6 Clicks" Mobility  Outcome Measure Help needed turning from your back to your side while in a flat bed without using bedrails?: None Help needed moving from lying on your back to sitting on the side of a flat bed without using bedrails?: None Help needed moving to and from a bed to a chair (including a wheelchair)?: None Help needed standing up from a chair using your arms (e.g., wheelchair or bedside chair)?: None Help needed to walk in hospital room?: None Help needed climbing 3-5 steps with a railing? : A Little 6 Click Score: 23    End of Session Equipment Utilized During Treatment: Cervical collar Activity Tolerance: Patient tolerated treatment well Patient left: Other (comment) (standing in room) Nurse Communication: Mobility status PT Visit Diagnosis: Pain Pain - part of body:  (neck)    Time: 1610-9604 PT Time Calculation (min) (ACUTE ONLY): 20 min   Charges:   PT Evaluation $PT Eval Low Complexity: 1 Low          Lillia Pauls, PT, DPT Acute  Rehabilitation Services Pager [phone] Office [phone]   Norval Morton 08/10/2021, 10:05 AM

## 2021-08-10 NOTE — Anesthesia Postprocedure Evaluation (Signed)
Anesthesia Post Note  Patient: Lindsey Graham  Procedure(s) Performed: ANTERIOR CERVICAL DISCECTOMY FUSION C4-5 AND C5-6 WITH PLATE, SCREWS, ALLOGRAFT, LOCAL BONE GRAFT, VIVIGEN     Patient location during evaluation: PACU Anesthesia Type: General Level of consciousness: sedated and patient cooperative Pain management: pain level controlled Vital Signs Assessment: post-procedure vital signs reviewed and stable Respiratory status: spontaneous breathing Cardiovascular status: stable Anesthetic complications: no   No notable events documented.  Last Vitals:  Vitals:   08/10/21 0400 08/10/21 0810  BP: 102/74 100/70  Pulse: 94 87  Resp: 18 18  Temp: 36.9 C 36.7 C  SpO2: 97% 98%    Last Pain:  Vitals:   08/10/21 0844  TempSrc:   PainSc: 7                  Lewie Loron

## 2021-08-11 ENCOUNTER — Encounter (HOSPITAL_COMMUNITY): Payer: Self-pay | Admitting: Specialist

## 2021-08-11 ENCOUNTER — Telehealth: Payer: Self-pay | Admitting: Specialist

## 2021-08-11 NOTE — Telephone Encounter (Signed)
I called and spoke with Archie Patten, I advised that she should keep the strips on till Monday next week. I also took her disability form and completed it. Once it is signed by Dr. Otelia Sergeant I will let her know

## 2021-08-11 NOTE — Telephone Encounter (Signed)
I called and advised that her form is ready for pick up

## 2021-08-11 NOTE — Telephone Encounter (Signed)
Patient is requesting the short-term disability form be sent to MyChart. If that is not possible she will come into office to pick it up. She says Crystal has the short-term disability form.  Patient also wants to know if she should keep the strip on the incision or take it off.

## 2021-08-12 NOTE — Progress Notes (Signed)
45 year old female history of C4-5 and C5-6 HNP/stenosis comes in for preop evaluation.  States that symptoms unchanged from previous visit.  She is wanting to proceed with C4-5 and C5-6 ACDF is scheduled.  Today history and physical performed.  Review of systems negative.  Surgical procedure discussed along with potential rehab/recovery time.  All questions answered.

## 2021-08-17 ENCOUNTER — Telehealth: Payer: Self-pay | Admitting: Specialist

## 2021-08-17 NOTE — Telephone Encounter (Signed)
Patient had surgery on 12/27. She got sick a couple days following surgery and has had numbness & pain down her left arm and hand. Please follow up with patient.

## 2021-08-19 ENCOUNTER — Other Ambulatory Visit: Payer: Self-pay | Admitting: Specialist

## 2021-08-22 ENCOUNTER — Other Ambulatory Visit: Payer: Self-pay | Admitting: Radiology

## 2021-08-22 MED ORDER — HYDROCODONE-ACETAMINOPHEN 7.5-325 MG PO TABS: 1.0000 | ORAL_TABLET | ORAL | 0 refills | Status: DC | PRN

## 2021-08-22 NOTE — Telephone Encounter (Signed)
Lindsey Graham patient

## 2021-08-23 ENCOUNTER — Ambulatory Visit: Payer: Self-pay | Admitting: Dermatology

## 2021-08-23 DIAGNOSIS — M4722 Other spondylosis with radiculopathy, cervical region: Secondary | ICD-10-CM

## 2021-08-25 ENCOUNTER — Other Ambulatory Visit: Payer: Self-pay

## 2021-08-25 ENCOUNTER — Other Ambulatory Visit: Payer: Self-pay | Admitting: Surgery

## 2021-08-25 ENCOUNTER — Ambulatory Visit (INDEPENDENT_AMBULATORY_CARE_PROVIDER_SITE_OTHER): Payer: BC Managed Care – PPO | Admitting: Surgery

## 2021-08-25 ENCOUNTER — Ambulatory Visit: Payer: Self-pay

## 2021-08-25 DIAGNOSIS — M5010 Cervical disc disorder with radiculopathy, unspecified cervical region: Secondary | ICD-10-CM

## 2021-08-25 DIAGNOSIS — G5692 Unspecified mononeuropathy of left upper limb: Secondary | ICD-10-CM

## 2021-09-01 NOTE — Telephone Encounter (Signed)
Lindsey Graham on 08/25/21

## 2021-09-06 ENCOUNTER — Other Ambulatory Visit: Payer: Self-pay | Admitting: Specialist

## 2021-09-16 ENCOUNTER — Other Ambulatory Visit: Payer: Self-pay

## 2021-09-16 ENCOUNTER — Encounter: Payer: Self-pay | Admitting: Physical Medicine and Rehabilitation

## 2021-09-16 ENCOUNTER — Ambulatory Visit (INDEPENDENT_AMBULATORY_CARE_PROVIDER_SITE_OTHER): Payer: BC Managed Care – PPO | Admitting: Physical Medicine and Rehabilitation

## 2021-09-16 DIAGNOSIS — R202 Paresthesia of skin: Secondary | ICD-10-CM

## 2021-09-16 NOTE — Progress Notes (Signed)
Pt state pain and numbness in her left elbow, wrist and hand. Pt state she has numbness in her left pointer and thumb. Pt state any movement and hold items makes the pain worse. Pt state she takes pain meds to help ease her pain. Pt state she right handed.  Numeric Pain Rating Scale and Functional Assessment Average Pain 6   In the last MONTH (on 0-10 scale) has pain interfered with the following?  1. General activity like being  able to carry out your everyday physical activities such as walking, climbing stairs, carrying groceries, or moving a chair?  Rating(9)

## 2021-09-19 NOTE — Progress Notes (Signed)
Lindsey Graham - 46 y.o. female MRN [id number]  Date of birth: 1976/04/26  Office Visit Note: Visit Date: 09/16/2021 PCP: Lavada Mesi, MD Referred by: Naida Sleight, PA-C  Subjective: Chief Complaint  Patient presents with   Left Elbow - Numbness, Pain   Left Hand - Pain, Numbness   Left Wrist - Pain, Numbness   HPI:  Lindsey Graham is a 46 y.o. female who comes in today at the request of Zonia Kief, PA-C for electrodiagnostic study of the Left upper extremities.  Patient is Right hand dominant.  He reports chronic worsening severe 6 out of 10 pain with numbness and tingling particular in the left elbow wrist and hand.  She reports most of the symptoms into her thumb and first digit.  She reports that any movement or holding items seems to make it worse.  She underwent ACDF at C4-5 and C5-6 in December of last year with Dr. [name].  She denies any right-sided complaints.  No prior electrodiagnostic studies to review.  ROS Otherwise per HPI.  Assessment & Plan: Visit Diagnoses:    ICD-10-CM   1. Paresthesia of skin  R20.2 NCV with EMG (electromyography)      Plan: Impression: Essentially NORMAL electrodiagnostic study of the left upper limb.  There is no significant electrodiagnostic evidence of nerve entrapment, brachial plexopathy or cervical radiculopathy.    As you know, purely sensory or demyelinating radiculopathies and chemical radiculitis may not be detected with this particular electrodiagnostic study.  Recommendations: 1.  Follow-up with referring physician. 2.  Continue current management of symptoms.  Meds & Orders: No orders of the defined types were placed in this encounter.   Orders Placed This Encounter  Procedures   NCV with EMG (electromyography)    Follow-up: Return in about 2 weeks (around 09/30/2021) for Zonia Kief, PA-C.   Procedures: No procedures performed  EMG & NCV Findings: All nerve conduction studies (as indicated in the following tables)  were within normal limits.    All examined muscles (as indicated in the following table) showed no evidence of electrical instability.    Impression: Essentially NORMAL electrodiagnostic study of the left upper limb.  There is no significant electrodiagnostic evidence of nerve entrapment, brachial plexopathy or cervical radiculopathy.    As you know, purely sensory or demyelinating radiculopathies and chemical radiculitis may not be detected with this particular electrodiagnostic study.  Recommendations: 1.  Follow-up with referring physician. 2.  Continue current management of symptoms.  ___________________________ Naaman Plummer FAAPMR Board Certified, American Board of Physical Medicine and Rehabilitation    Nerve Conduction Studies Anti Sensory Summary Table   Stim Site NR Peak (ms) Norm Peak (ms) P-T Amp (V) Norm P-T Amp Site1 Site2 Delta-P (ms) Dist (cm) Vel (m/s) Norm Vel (m/s)  Left Median Acr Palm Anti Sensory (2nd Digit)  31.2C  Wrist    3.2 <3.6 42.1 >10 Wrist Palm 1.5 0.0    Palm    1.7 <2.0 48.2         Left Radial Anti Sensory (Base 1st Digit)  30.5C  Wrist    2.1 <3.1 26.6  Wrist Base 1st Digit 2.1 0.0    Left Ulnar Anti Sensory (5th Digit)  31.7C  Wrist    3.4 <3.7 23.8 >15.0 Wrist 5th Digit 3.4 14.0 41 >38   Motor Summary Table   Stim Site NR Onset (ms) Norm Onset (ms) O-P Amp (mV) Norm O-P Amp Site1 Site2 Delta-0 (ms) Dist (cm) Vel (m/s) Norm  Vel (m/s)  Left Median Motor (Abd Poll Brev)  31.4C  Wrist    3.3 <4.2 7.5 >5 Elbow Wrist 3.7 19.5 53 >50  Elbow    7.0  7.0         Left Ulnar Motor (Abd Dig Min)  31.7C  Wrist    2.6 <4.2 9.1 >3 B Elbow Wrist 3.2 19.0 59 >53  B Elbow    5.8  8.1  A Elbow B Elbow 1.5 10.0 67 >53  A Elbow    7.3  8.3          EMG   Side Muscle Nerve Root Ins Act Fibs Psw Amp Dur Poly Recrt Int Dennie Bible Comment  Left 1stDorInt Ulnar C8-T1 Nml Nml Nml Nml Nml 0 Nml Nml   Left Abd Poll Brev Median C8-T1 Nml Nml Nml Nml Nml 0 Nml Nml    Left ExtDigCom   Nml Nml Nml Nml Nml 0 Nml Nml   Left Triceps Radial C6-7-8 Nml Nml Nml Nml Nml 0 Nml Nml   Left Deltoid Axillary C5-6 Nml Nml Nml Nml Nml 0 Nml Nml     Nerve Conduction Studies Anti Sensory Left/Right Comparison   Stim Site L Lat (ms) R Lat (ms) L-R Lat (ms) L Amp (V) R Amp (V) L-R Amp (%) Site1 Site2 L Vel (m/s) R Vel (m/s) L-R Vel (m/s)  Median Acr Palm Anti Sensory (2nd Digit)  31.2C  Wrist 3.2   42.1   Wrist Palm     Palm 1.7   48.2         Radial Anti Sensory (Base 1st Digit)  30.5C  Wrist 2.1   26.6   Wrist Base 1st Digit     Ulnar Anti Sensory (5th Digit)  31.7C  Wrist 3.4   23.8   Wrist 5th Digit 41     Motor Left/Right Comparison   Stim Site L Lat (ms) R Lat (ms) L-R Lat (ms) L Amp (mV) R Amp (mV) L-R Amp (%) Site1 Site2 L Vel (m/s) R Vel (m/s) L-R Vel (m/s)  Median Motor (Abd Poll Brev)  31.4C  Wrist 3.3   7.5   Elbow Wrist 53    Elbow 7.0   7.0         Ulnar Motor (Abd Dig Min)  31.7C  Wrist 2.6   9.1   B Elbow Wrist 59    B Elbow 5.8   8.1   A Elbow B Elbow 67    A Elbow 7.3   8.3            Waveforms:             Clinical History: MRI CERVICAL SPINE WITHOUT CONTRAST   TECHNIQUE: Multiplanar, multisequence MR imaging of the cervical spine was performed. No intravenous contrast was administered.   COMPARISON:  Prior MRI from 11/19/2019.   FINDINGS: Alignment: Straightening of the normal cervical lordosis. No listhesis or static subluxation.   Vertebrae: Vertebral body height maintained without acute or chronic fracture. Bone marrow signal intensity within normal limits. Few scattered benign hemangiomata noted. No worrisome osseous lesions. No abnormal marrow edema.   Cord: Normal signal and morphology.   Posterior Fossa, vertebral arteries, paraspinal tissues: Cerebellar tonsils are low lying protruding up to 6-7 mm through the foramen magnum, suggesting a Chiari 1 malformation. Visualized brain otherwise unremarkable.  Craniocervical junction within normal limits. Paraspinous and prevertebral soft tissues are normal. Normal flow voids seen within the vertebral arteries bilaterally.  Disc levels:   C2-C3: Unremarkable.   C3-C4: Mild disc bulge with uncovertebral spurring. Flattening and partial effacement of the ventral thecal sac without significant stenosis or cord deformity. Previously seen small right paracentral to foraminal protrusion has largely regressed. Foramina remain patent.   C4-C5: Broad left paracentral to foraminal disc osteophyte complex indents and partially effaces the ventral thecal sac. Mild flattening of the left ventral cord without cord signal changes. This is also slightly regressed from previous and is decreased in size. Residual mild spinal stenosis. Foramina remain patent.   C5-C6: Chronic intervertebral disc space narrowing with diffuse disc osteophyte complex, asymmetric to the left. Broad posterior component flattens and partially effaces the ventral thecal sac with resultant mild spinal stenosis, greater on the left. No significant cord deformity. Left greater than right uncinate spurring with moderate left C6 foraminal narrowing. Right neural foramina remains patent. Appearance is stable from previous.   C6-C7: Persistent subtle right paracentral disc protrusion indents the right ventral thecal sac (series 9, image 24). No significant spinal stenosis or cord deformity, although again the ventral right C7 nerve root could be affected. Size of this protrusion is stable to perhaps slightly decreased from prior. Foramina remain widely patent.   C7-T1: Negative interspace. Mild left-sided facet hypertrophy. No canal or foraminal stenosis.   Visualized upper thoracic spine demonstrates no new significant finding.   IMPRESSION: 1. Persistent small right paracentral disc protrusion at C6-7, potentially affecting the ventral right C7 nerve root. Overall, size of  this protrusion is stable to perhaps slightly decreased from prior. 2. Left paracentral to foraminal disc osteophyte complex at C5-6 with resultant mild canal and moderate left C6 foraminal stenosis, stable. 3. Partial interval regression of previously seen small right paracentral to foraminal disc protrusion at C3-4 as well as left-sided protrusion at C4-5. 4. Cerebellar tonsils are low lying extending up to 7 mm through the foramen magnum, suggesting a Chiari 1 malformation.     Electronically Signed   By: Rise Mu M.D.   On: 07/01/2020 04:39     Objective:  VS:  HT:     WT:    BMI:      BP:    HR: bpm   TEMP: ( )   RESP:  Physical Exam Musculoskeletal:        General: No swelling, tenderness or deformity.     Comments: Inspection reveals no atrophy of the bilateral APB or FDI or hand intrinsics. There is no swelling, color changes, allodynia or dystrophic changes. There is 5 out of 5 strength in the bilateral wrist extension, finger abduction and long finger flexion. There is somewhat impaired sensation to light touch in in the C6 versus median nerve distribution.  There is a negative Tinel's test at the bilateral wrist and elbow. There is a negative Phalen's test bilaterally. There is a negative Hoffmann's test bilaterally.  Skin:    General: Skin is warm and dry.     Findings: No erythema or rash.  Neurological:     General: No focal deficit present.     Mental Status: She is alert and oriented to person, place, and time.     Motor: No weakness or abnormal muscle tone.     Coordination: Coordination normal.  Psychiatric:        Mood and Affect: Mood normal.        Behavior: Behavior normal.     Imaging: No results found.

## 2021-09-19 NOTE — Procedures (Signed)
EMG & NCV Findings: All nerve conduction studies (as indicated in the following tables) were within normal limits.    All examined muscles (as indicated in the following table) showed no evidence of electrical instability.    Impression: Essentially NORMAL electrodiagnostic study of the left upper limb.  There is no significant electrodiagnostic evidence of nerve entrapment, brachial plexopathy or cervical radiculopathy.    As you know, purely sensory or demyelinating radiculopathies and chemical radiculitis may not be detected with this particular electrodiagnostic study.  Recommendations: 1.  Follow-up with referring physician. 2.  Continue current management of symptoms.  ___________________________ Naaman Plummer FAAPMR Board Certified, American Board of Physical Medicine and Rehabilitation    Nerve Conduction Studies Anti Sensory Summary Table   Stim Site NR Peak (ms) Norm Peak (ms) P-T Amp (V) Norm P-T Amp Site1 Site2 Delta-P (ms) Dist (cm) Vel (m/s) Norm Vel (m/s)  Left Median Acr Palm Anti Sensory (2nd Digit)  31.2C  Wrist    3.2 <3.6 42.1 >10 Wrist Palm 1.5 0.0    Palm    1.7 <2.0 48.2         Left Radial Anti Sensory (Base 1st Digit)  30.5C  Wrist    2.1 <3.1 26.6  Wrist Base 1st Digit 2.1 0.0    Left Ulnar Anti Sensory (5th Digit)  31.7C  Wrist    3.4 <3.7 23.8 >15.0 Wrist 5th Digit 3.4 14.0 41 >38   Motor Summary Table   Stim Site NR Onset (ms) Norm Onset (ms) O-P Amp (mV) Norm O-P Amp Site1 Site2 Delta-0 (ms) Dist (cm) Vel (m/s) Norm Vel (m/s)  Left Median Motor (Abd Poll Brev)  31.4C  Wrist    3.3 <4.2 7.5 >5 Elbow Wrist 3.7 19.5 53 >50  Elbow    7.0  7.0         Left Ulnar Motor (Abd Dig Min)  31.7C  Wrist    2.6 <4.2 9.1 >3 B Elbow Wrist 3.2 19.0 59 >53  B Elbow    5.8  8.1  A Elbow B Elbow 1.5 10.0 67 >53  A Elbow    7.3  8.3          EMG   Side Muscle Nerve Root Ins Act Fibs Psw Amp Dur Poly Recrt Int Dennie Bible Comment  Left 1stDorInt Ulnar C8-T1 Nml Nml  Nml Nml Nml 0 Nml Nml   Left Abd Poll Brev Median C8-T1 Nml Nml Nml Nml Nml 0 Nml Nml   Left ExtDigCom   Nml Nml Nml Nml Nml 0 Nml Nml   Left Triceps Radial C6-7-8 Nml Nml Nml Nml Nml 0 Nml Nml   Left Deltoid Axillary C5-6 Nml Nml Nml Nml Nml 0 Nml Nml     Nerve Conduction Studies Anti Sensory Left/Right Comparison   Stim Site L Lat (ms) R Lat (ms) L-R Lat (ms) L Amp (V) R Amp (V) L-R Amp (%) Site1 Site2 L Vel (m/s) R Vel (m/s) L-R Vel (m/s)  Median Acr Palm Anti Sensory (2nd Digit)  31.2C  Wrist 3.2   42.1   Wrist Palm     Palm 1.7   48.2         Radial Anti Sensory (Base 1st Digit)  30.5C  Wrist 2.1   26.6   Wrist Base 1st Digit     Ulnar Anti Sensory (5th Digit)  31.7C  Wrist 3.4   23.8   Wrist 5th Digit 41     Motor Left/Right Comparison  Stim Site L Lat (ms) R Lat (ms) L-R Lat (ms) L Amp (mV) R Amp (mV) L-R Amp (%) Site1 Site2 L Vel (m/s) R Vel (m/s) L-R Vel (m/s)  Median Motor (Abd Poll Brev)  31.4C  Wrist 3.3   7.5   Elbow Wrist 53    Elbow 7.0   7.0         Ulnar Motor (Abd Dig Min)  31.7C  Wrist 2.6   9.1   B Elbow Wrist 59    B Elbow 5.8   8.1   A Elbow B Elbow 67    A Elbow 7.3   8.3            Waveforms:

## 2021-09-22 ENCOUNTER — Ambulatory Visit (INDEPENDENT_AMBULATORY_CARE_PROVIDER_SITE_OTHER): Payer: BC Managed Care – PPO | Admitting: Specialist

## 2021-09-22 ENCOUNTER — Ambulatory Visit (INDEPENDENT_AMBULATORY_CARE_PROVIDER_SITE_OTHER): Payer: BC Managed Care – PPO

## 2021-09-22 ENCOUNTER — Encounter: Payer: Self-pay | Admitting: Specialist

## 2021-09-22 ENCOUNTER — Other Ambulatory Visit: Payer: Self-pay

## 2021-09-22 VITALS — BP 126/82 | HR 102 | Ht 66.0 in | Wt 173.0 lb

## 2021-09-22 DIAGNOSIS — Z981 Arthrodesis status: Secondary | ICD-10-CM

## 2021-09-22 MED ORDER — HYDROCODONE-ACETAMINOPHEN 7.5-325 MG PO TABS: 1.0000 | ORAL_TABLET | ORAL | 0 refills | Status: DC | PRN

## 2021-09-22 MED ORDER — METHOCARBAMOL 500 MG PO TABS: 500.0000 mg | ORAL_TABLET | Freq: Three times a day (TID) | ORAL | 1 refills | Status: DC | PRN

## 2021-09-22 NOTE — Addendum Note (Signed)
Addended by: [name] on: 09/22/2021 03:02 PM   Modules accepted: Orders

## 2021-09-22 NOTE — Progress Notes (Signed)
° °  Post-Op Visit Note   Patient: Lindsey Graham           Date of Birth: Apr 06, 1976           MRN: [id number] Visit Date: 09/22/2021 PCP: Lavada Mesi, MD   Assessment & Plan:  Chief Complaint:  Chief Complaint  Patient presents with   Neck - Routine Post Op   Visit Diagnoses:  1. S/P cervical spinal fusion   Awake alert and having some discomfort left upper scapula. Some tingling into the left arm that comes and goes There is discomfort at the top of the shoulder. Motor is normal Swallowing is better..  Plan: Avoid overhead lifting and overhead use of the arms. Do not lift greater than 5 lbs. Adjust head rest in vehicle to prevent hyperextension if rear ended. Take extra precautions to avoid falling. Use current collar for 2 weeks then go to soft c-collar Follow-Up Instructions: No follow-ups on file.   Orders:  Orders Placed This Encounter  Procedures   XR Cervical Spine 2 or 3 views   No orders of the defined types were placed in this encounter.   Imaging: No results found.  PMFS History: Patient Active Problem List   Diagnosis Date Noted   Herniation of cervical intervertebral disc with radiculopathy 08/09/2021    Priority: High   Other spondylosis with radiculopathy, cervical region    S/P cervical spinal fusion 08/09/2021   Chronic low back pain 03/26/2020   Neck pain 03/26/2020   Other chronic pain 03/26/2020   Nodule of upper lobe of right lung 10/08/2019   Head injury 06/04/2019   Post concussion syndrome 06/04/2019   Chronic migraine 06/04/2019   Back pain 06/04/2019   Past Medical History:  Diagnosis Date   Anxiety    Dyspnea    with COVID   PONV (postoperative nausea and vomiting)     Family History  Problem Relation Age of Onset   COPD Father     Past Surgical History:  Procedure Laterality Date   ANTERIOR CERVICAL DECOMP/DISCECTOMY FUSION N/A 08/09/2021   Procedure: ANTERIOR CERVICAL DISCECTOMY FUSION C4-5 AND C5-6 WITH PLATE, SCREWS,  ALLOGRAFT, LOCAL BONE GRAFT, VIVIGEN;  Surgeon: Kerrin Champagne, MD;  Location: [facility] OR;  Service: Orthopedics;  Laterality: N/A;   EYE SURGERY     Social History   Occupational History   Not on file  Tobacco Use   Smoking status: Former    Types: E-cigarettes   Smokeless tobacco: Current  Vaping Use   Vaping Use: Every day  Substance and Sexual Activity   Alcohol use: Never   Drug use: Never   Sexual activity: Yes

## 2021-09-22 NOTE — Patient Instructions (Signed)
Plan: Avoid overhead lifting and overhead use of the arms. Do not lift greater than 5 lbs. Adjust head rest in vehicle to prevent hyperextension if rear ended. Take extra precautions to avoid falling. Use current collar for 2 weeks then go to soft c-collar Follow-Up Instructions: No follow-ups on file.

## 2021-09-23 NOTE — Progress Notes (Signed)
° °  Office Visit Note   Patient: Lindsey Graham           Date of Birth: 15-Jan-1976           MRN: [id number] Visit Date: 08/25/2021              Requested by: No referring provider defined for this encounter. PCP: Eunice Blase, MD   Assessment & Plan: Visit Diagnoses:  1. Herniation of cervical intervertebral disc with radiculopathy   2. Neuropathy, arm, left     Plan: Advised patient that I think her neck is doing well at this point.  With her ongoing left upper extremity neuropathy I will schedule NCV/EMG study to rule out cubital tunnel, carpal tunnel.  Follow-up with Dr. Merrilee Seashore in 4 weeks to discuss results.  Follow-Up Instructions: Return in about 4 weeks (around 09/22/2021) for with dr Louanne Skye for postop check and review ncv/emg study.   Orders:  Orders Placed This Encounter  Procedures   Ambulatory referral to Physical Medicine Rehab   No orders of the defined types were placed in this encounter.     Procedures: No procedures performed   Clinical Data: No additional findings.   Subjective: Chief Complaint  Patient presents with   Neck - Routine Post Op    HPI 46 year old white female who is status post C4-5 and C5-6 ACDF August 09, 2021 returns.  Patient dates that she has been wearing her collar.  She heard something pop in her neck.  States that she continues to have ongoing burning and tingling in her left arm.  She has not had a NCV/EMG study.    Objective: Vital Signs: There were no vitals taken for this visit.  Physical Exam Neck surgical decision looks good.  No drainage or signs infection.  Left arm she has a positive Tinel's at the left cubital tunnel and left carpal tunnel.Manson Passey Exam  Specialty Comments:  No specialty comments available.  Imaging: No results found.   PMFS History: Patient Active Problem List   Diagnosis Date Noted   Other spondylosis with radiculopathy, cervical region    S/P cervical spinal fusion 08/09/2021    Herniation of cervical intervertebral disc with radiculopathy 08/09/2021   Chronic low back pain 03/26/2020   Neck pain 03/26/2020   Other chronic pain 03/26/2020   Nodule of upper lobe of right lung 10/08/2019   Head injury 06/04/2019   Post concussion syndrome 06/04/2019   Chronic migraine 06/04/2019   Back pain 06/04/2019   Past Medical History:  Diagnosis Date   Anxiety    Dyspnea    with COVID   PONV (postoperative nausea and vomiting)     Family History  Problem Relation Age of Onset   COPD Father     Past Surgical History:  Procedure Laterality Date   ANTERIOR CERVICAL DECOMP/DISCECTOMY FUSION N/A 08/09/2021   Procedure: ANTERIOR CERVICAL DISCECTOMY FUSION C4-5 AND C5-6 WITH PLATE, SCREWS, ALLOGRAFT, LOCAL BONE GRAFT, VIVIGEN;  Surgeon: Jessy Oto, MD;  Location: [facility];  Service: Orthopedics;  Laterality: N/A;   EYE SURGERY     Social History   Occupational History   Not on file  Tobacco Use   Smoking status: Former    Types: E-cigarettes   Smokeless tobacco: Current  Vaping Use   Vaping Use: Every day  Substance and Sexual Activity   Alcohol use: Never   Drug use: Never   Sexual activity: Yes

## 2021-11-02 ENCOUNTER — Ambulatory Visit (INDEPENDENT_AMBULATORY_CARE_PROVIDER_SITE_OTHER): Payer: Self-pay | Admitting: Family Medicine

## 2021-11-02 ENCOUNTER — Other Ambulatory Visit: Payer: Self-pay

## 2021-11-02 ENCOUNTER — Encounter: Payer: Self-pay | Admitting: Family Medicine

## 2021-11-02 VITALS — BP 106/77 | HR 90 | Ht 66.0 in | Wt 172.0 lb

## 2021-11-02 DIAGNOSIS — G44329 Chronic post-traumatic headache, not intractable: Secondary | ICD-10-CM

## 2021-11-02 MED ORDER — RIZATRIPTAN BENZOATE 10 MG PO TABS: 10.0000 mg | ORAL_TABLET | ORAL | 11 refills | Status: DC | PRN
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
## 2021-11-02 NOTE — Patient Instructions (Signed)
Below is our plan: ? ?We will discontinue Emgality. Continue rizatriptan as needed. Let me know if headaches worsen and we can resume Emgality.  ? ?Please make sure you are staying well hydrated. I recommend 50-60 ounces daily. Well balanced diet and regular exercise encouraged. Consistent sleep schedule with 6-8 hours recommended.  ? ?Please continue follow up with care team as directed.  ? ?Follow up with me in 1 year  ? ?You may receive a survey regarding today's visit. I encourage you to leave honest feed back as I do use this information to improve patient care. Thank you for seeing me today!  ? ? ?

## 2021-11-03 ENCOUNTER — Encounter: Payer: Self-pay | Admitting: Specialist

## 2021-11-03 ENCOUNTER — Other Ambulatory Visit: Payer: Self-pay

## 2021-11-03 ENCOUNTER — Other Ambulatory Visit: Payer: Self-pay | Admitting: Specialist

## 2021-11-03 ENCOUNTER — Ambulatory Visit (INDEPENDENT_AMBULATORY_CARE_PROVIDER_SITE_OTHER): Payer: BC Managed Care – PPO | Admitting: Specialist

## 2021-11-03 ENCOUNTER — Ambulatory Visit (INDEPENDENT_AMBULATORY_CARE_PROVIDER_SITE_OTHER): Payer: BC Managed Care – PPO

## 2021-11-03 VITALS — BP 105/75 | HR 87 | Ht 66.0 in | Wt 172.0 lb

## 2021-11-03 DIAGNOSIS — M25532 Pain in left wrist: Secondary | ICD-10-CM | POA: Diagnosis not present

## 2021-11-03 DIAGNOSIS — M47812 Spondylosis without myelopathy or radiculopathy, cervical region: Secondary | ICD-10-CM

## 2021-11-03 DIAGNOSIS — M48061 Spinal stenosis, lumbar region without neurogenic claudication: Secondary | ICD-10-CM

## 2021-11-03 DIAGNOSIS — Z981 Arthrodesis status: Secondary | ICD-10-CM | POA: Diagnosis not present

## 2021-11-03 MED ORDER — NAPROXEN SODIUM ER 375 MG PO TB24: 1.0000 | ORAL_TABLET | Freq: Every day | ORAL | 3 refills | Status: DC

## 2021-11-03 NOTE — Patient Instructions (Signed)
Plan:  Avoid overhead lifting and overhead use of the arms. ?Do not lift greater than 5 lbs. ?Adjust head rest in vehicle to prevent hyperextension if rear ended. ?Take extra precautions to avoid falling. ?PT for cervical spine ROM, McKenzie exercises. ?Avoid bending, stooping and avoid lifting weights greater than 10 lbs. ?Avoid prolong standing and walking. ?Avoid frequent bending and stooping  ?No lifting greater than 10 lbs. ?May use ice or moist heat for pain. ?Weight loss is of benefit. ?Handicap license is approved. ?Dr.  Blas secretary/Assistant will call to arrange for epidural steroid injection  ?

## 2021-11-03 NOTE — Addendum Note (Signed)
Addended by: [name] on: 11/03/2021 05:10 PM ? ? Modules accepted: Orders ? ?

## 2021-11-03 NOTE — Progress Notes (Signed)
current symptoms or signs.  ? ?Office Visit Note ?  ?Patient: Lindsey Graham           ?Date of Birth: [birth year]           ?MRN: [id number] ?Visit Date: 11/03/2021 ?             ?Requested by: Eunice Blase, MD ?[address] ?[place],  [place] [id number] ?PCP: Eunice Blase, MD ? ? ?Assessment & Plan: ?Visit Diagnoses:  ?1. S/P cervical spinal fusion   ? ? ?Plan:  Avoid overhead lifting and overhead use of the arms. ?Do not lift greater than 5 lbs. ?Adjust head rest in vehicle to prevent hyperextension if rear ended. ?Take extra precautions to avoid falling. ?PT for cervical spine ROM, McKenzie exercises. ?Avoid bending, stooping and avoid lifting weights greater than 10 lbs. ?Avoid prolong standing and walking. ?Avoid frequent bending and stooping  ?No lifting greater than 10 lbs. ?May use ice or moist heat for pain. ?Weight loss is of benefit. ?Handicap license is approved. ?Dr. Romona Curls secretary/Assistant will call to arrange for epidural steroid injection   ? ?Follow-Up Instructions: No follow-ups on file.  ? ?Orders:  ?Orders Placed This Encounter  ?Procedures  ?? XR Cervical Spine 2 or 3 views  ? ?No orders of the defined types were placed in this encounter. ? ? ? ? Procedures: ?No procedures performed ? ? ?Clinical Data: ?Findings:  ?CLINICAL DATA: Low back pain. Bilateral S1 radiculopathy. ? ?EXAM: ?MRI LUMBAR SPINE WITHOUT CONTRAST ? ?TECHNIQUE: ?Multiplanar, multisequence MR imaging of the lumbar spine was ?performed. No intravenous contrast was administered. ? ?COMPARISON: Lumbar MRI 10/27/2019 ? ?FINDINGS: ?Segmentation: S1 is a fully lumbarized vertebral body. There is a ?normal disc space at S1-2. Numbering consistent with the prior MRI ?report. ? ?Alignment: Mild retrolisthesis L3-4, L4-5, L5-S1. ? ?Vertebrae: Normal bone marrow. Negative for fracture or mass. ? ?Conus medullaris and cauda equina: Conus extends to the L1-2 level. ?Conus and cauda equina appear normal. ? ?Paraspinal and other  soft tissues: Negative for paraspinous mass or ?adenopathy. ? ?Disc levels: ? ?L1-2: Negative ? ?L2-3: Negative ? ?L3-4: Negative ? ?L4-5: Mild disc bulging with small central disc protrusion unchanged ?from prior. Mild facet hypertrophy. Negative for spinal or foraminal ?stenosis. ? ?L5-S1: Shallow broad-based disc protrusion. Central annular fissure ?with small central disc protrusion unchanged. Bilateral facet and ?ligamentum flavum hypertrophy. Mild spinal stenosis and moderate ?subarticular stenosis bilaterally. No interval change. ? ?S1-2: Negative ? ?IMPRESSION: ?S1 is a fully lumbarized vertebra. ? ?Disc bulging and small central disc protrusion L4-5 unchanged ? ?Shallow central disc protrusion L5-S1 with bilateral facet ?hypertrophy. Mild spinal stenosis and moderate subarticular stenosis ?bilaterally, without interval change. ? ? ?Electronically Signed ?By: Franchot Gallo M.D. ?On: 07/02/2020 15:01  ? ? ? ?Subjective: ?Chief Complaint  ?Patient presents with  ?? Neck - Routine Post Op  ? ? ?46 year old female with history of C4-5 and C5-6 ACDF for disc protrusions and ongoing left arm pain and numbness. Felt a pop with movement of her neck in flexion and to the left side. She had an injection by Dr. Lemar Livings right wrist by Dr. Junius Roads and he told her she had a cyst ?In the area he injected. She has had an MRI arthrogram by Dr. Burney Gauze and this was negative for joint or ligament injury. There was a cyst noted in the scaphoid bone.  ? ?Review of Systems ? ? ?Objective: ?Vital Signs: BP 105/75 (BP Location: Left Arm, Patient Position:  Sitting)   Pulse 87   Ht 5\' 6"  (1.676 m)   Wt 172 lb (78 kg)   BMI 27.76 kg/m?  ? ?Physical Exam ? ?Ortho Exam ? ?Specialty Comments:  ?No specialty comments available. ? ?Imaging: ?No results found. ? ? ?PMFS History: ?Patient Active Problem List  ? Diagnosis Date Noted  ?? Herniation of cervical intervertebral disc with radiculopathy 08/09/2021  ?  Priority: High  ?? Other  spondylosis with radiculopathy, cervical region   ?? S/P cervical spinal fusion 08/09/2021  ?? Chronic low back pain 03/26/2020  ?? Neck pain 03/26/2020  ?? Other chronic pain 03/26/2020  ?? Nodule of upper lobe of right lung 10/08/2019  ?? Head injury 06/04/2019  ?? Post concussion syndrome 06/04/2019  ?? Chronic migraine 06/04/2019  ?? Back pain 06/04/2019  ? ?Past Medical History:  ?Diagnosis Date  ?? Anxiety   ?? Dyspnea   ? with COVID  ?? PONV (postoperative nausea and vomiting)   ?  ?Family History  ?Problem Relation Age of Onset  ?? COPD Father   ?  ?Past Surgical History:  ?Procedure Laterality Date  ?? ANTERIOR CERVICAL DECOMP/DISCECTOMY FUSION N/A 08/09/2021  ? Procedure: ANTERIOR CERVICAL DISCECTOMY FUSION C4-5 AND C5-6 WITH PLATE, SCREWS, ALLOGRAFT, LOCAL BONE GRAFT, VIVIGEN;  Surgeon: Jessy Oto, MD;  Location: [facility];  Service: Orthopedics;  Laterality: N/A;  ?? EYE SURGERY    ? ?Social History  ? ?Occupational History  ?? Not on file  ?Tobacco Use  ?? Smoking status: Former  ?  Types: E-cigarettes  ?? Smokeless tobacco: Current  ?Vaping Use  ?? Vaping Use: Every day  ?Substance and Sexual Activity  ?? Alcohol use: Never  ?? Drug use: Never  ?? Sexual activity: Yes  ? ? ? ? ? ? ?

## 2021-11-07 ENCOUNTER — Other Ambulatory Visit: Payer: Self-pay

## 2021-11-07 ENCOUNTER — Encounter: Payer: Self-pay | Admitting: Rehabilitative and Restorative Service Providers"

## 2021-11-07 ENCOUNTER — Ambulatory Visit (INDEPENDENT_AMBULATORY_CARE_PROVIDER_SITE_OTHER): Payer: BC Managed Care – PPO | Admitting: Rehabilitative and Restorative Service Providers"

## 2021-11-07 DIAGNOSIS — M25532 Pain in left wrist: Secondary | ICD-10-CM

## 2021-11-07 DIAGNOSIS — M6281 Muscle weakness (generalized): Secondary | ICD-10-CM

## 2021-11-07 DIAGNOSIS — M542 Cervicalgia: Secondary | ICD-10-CM | POA: Diagnosis not present

## 2021-11-07 DIAGNOSIS — R202 Paresthesia of skin: Secondary | ICD-10-CM | POA: Diagnosis not present
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
## 2021-11-11 ENCOUNTER — Ambulatory Visit (INDEPENDENT_AMBULATORY_CARE_PROVIDER_SITE_OTHER): Payer: BC Managed Care – PPO | Admitting: Rehabilitative and Restorative Service Providers"

## 2021-11-11 DIAGNOSIS — M542 Cervicalgia: Secondary | ICD-10-CM

## 2021-11-11 DIAGNOSIS — M25532 Pain in left wrist: Secondary | ICD-10-CM

## 2021-11-11 DIAGNOSIS — R202 Paresthesia of skin: Secondary | ICD-10-CM | POA: Diagnosis not present

## 2021-11-11 DIAGNOSIS — M6281 Muscle weakness (generalized): Secondary | ICD-10-CM

## 2021-11-11 NOTE — Therapy (Signed)
?OUTPATIENT OCCUPATIONAL THERAPY TREATMENT NOTE ? ? ?Patient Name: Notnamed Croucher ?MRN: [id number] ?DOB:October 07, 1975, 46 y.o., female ?Today's Date: 11/11/2021 ? ?PCP: Lavada Mesi, MD ?REFERRING PROVIDER: Kerrin Champagne, MD ? ? OT End of Session - 11/11/21 1107   ? ? Visit Number 2   ? Number of Visits 12   ? Date for OT Re-Evaluation 12/16/21   ? OT Start Time 1107   ? OT Stop Time 1154   ? OT Time Calculation (min) 47 min   ? Activity Tolerance Patient tolerated treatment well;No increased pain;Patient limited by fatigue   ? Behavior During Therapy [facility] for tasks assessed/performed   ? ?  ?  ? ?  ? ? ?Past Medical History:  ?Diagnosis Date  ? Anxiety   ? Dyspnea   ? with COVID  ? PONV (postoperative nausea and vomiting)   ? ?Past Surgical History:  ?Procedure Laterality Date  ? ANTERIOR CERVICAL DECOMP/DISCECTOMY FUSION N/A 08/09/2021  ? Procedure: ANTERIOR CERVICAL DISCECTOMY FUSION C4-5 AND C5-6 WITH PLATE, SCREWS, ALLOGRAFT, LOCAL BONE GRAFT, VIVIGEN;  Surgeon: Kerrin Champagne, MD;  Location: [facility];  Service: Orthopedics;  Laterality: N/A;  ? EYE SURGERY    ? ?Patient Active Problem List  ? Diagnosis Date Noted  ? Other spondylosis with radiculopathy, cervical region   ? S/P cervical spinal fusion 08/09/2021  ? Herniation of cervical intervertebral disc with radiculopathy 08/09/2021  ? Chronic low back pain 03/26/2020  ? Neck pain 03/26/2020  ? Other chronic pain 03/26/2020  ? Nodule of upper lobe of right lung 10/08/2019  ? Head injury 06/04/2019  ? Post concussion syndrome 06/04/2019  ? Chronic migraine 06/04/2019  ? Back pain 06/04/2019  ? ? ?ONSET DATE: DOS 08/09/21 ?  ?REFERRING DIAG:  ?Z98.1 (ICD-10-CM) - S/P cervical spinal fusion  ?M25.532 (ICD-10-CM) - Pain in left wrist  ? ? ?THERAPY DIAG:  ?Cervicalgia ? ?Muscle weakness (generalized) ? ?Pain in left wrist ? ?Paresthesia of skin ? ? ?PERTINENT HISTORY: Per MD: "Left wrist pain dorsal radial likely this is mild chronic sprain, she is post 2 level CDF C4-6 4  months ago. Has fusion by radiographs start cervical spine ROM, McKenzie exercises, H, M and left UE ROM and strengthening" ? ?PRECAUTIONS: N/A ? ?SUBJECTIVE: She comes in hurting in her lower back badly (this is ongoing issue), and stating that she was doing exercises, feeling a tender spot on levator scapula on Lt side. Still most nerve issues are on Lt side as well.  ? ?PAIN:  ?Are you having pain? Yes, back is "flared up" and Lt arm is moderately numb right now  ?Rating/Location: Moderate today ? ? ?OBJECTIVE: (All objective assessments below are from initial evaluation on: 11/07/21 unless otherwise specified.)  ? ? ?HAND DOMINANCE: Right ?  ?ADLs: ?Overall ADLs: Has been asking for help carrying things, opening containers, has had pain when pushing with left hand/wrist, also difficulty with IADL work tasks, etc.  ?  ?FUNCTIONAL OUTCOME MEASURES: ?Quick Dash: 75% impairment today 11/07/21 ?  ?UE ROM: ?  ?Cervical ROM today 11/07/21: L Rot: 30*, R Rot: 32*, Flexion: 12*, Ext: 10* ?  ?Active ROM Left ?11/07/2021  ?Shoulder flexion    ?Shoulder abduction    ?Shoulder adduction    ?Shoulder extension    ?Shoulder internal rotation    ?Shoulder external rotation    ?Elbow flexion WNL  ?Elbow extension WNL  ?Wrist flexion 55  ?Wrist extension 67  ?(Blank rows = not tested) ?  ?UE MMT:  NT at eval but at least 4-/5 MMT though b/l UEs based on observations/other tests ?  ?  ?HAND FUNCTION: ?Grip strength: Right: 65 lbs; Left: 60 pain lbs and Lateral pinch: Right: 13 lbs, Left: 10 painful lbs ?  ?COORDINATION: no overt issues, can oppose thumb to all fingers, make full fist, but may need measured due to nerve c/o's  ?9 Hole Peg test: Right: TBD sec; Left: TBD sec ?  ?SENSATION: ?Light touch: WFL ?WFL in hands now, may need monofilament testing, etc., if symptoms extend into fingers. She does state sensory disturbance roughly through radial nerve distribution in left side of neck, shoulder, arm. ?  ?EDEMA: none significant ?   ?  ?OBSERVATIONS: Neck overtly stiff, TTP in distal radial ulnar joint, but not TFCC, not in thumb CMC, she states tightness in webspace,  negative scaphoid shift test.  ?  ?  ?TODAY'S TREATMENT:  ?11/11/21: She is given MH for low back pain while seated and on lt side of neck while verbally reviewing HEP and expectations.  Off MH, she performs neck AROM in multiple planes, per HEP, and OT does manual therapy IASTM to upper trap/levator scap, noting rolling, tight tendons and muscle knots. These improve somewhat after manual therapy, and she is educated on new stretches for levator scap, postural retraction with shoulder ER for contract/relax technique, and radial and ulnar nerve glides to help with paresthesias down arm.  She tolerates new exercises well and new neuro techniques well, feeling cool tingle, not putting too much tension on nerves.  ? ?Exercises ?- Standing Cervical Rotation AROM  - 3-4 x daily - 7 x weekly - 1 sets - 5 reps - 5-10seconds hold ?- Standing Cervical Flexion AROM  - 3-4 x daily - 7 x weekly - 1 sets - 5 reps - 5-10 seconds hold ?- Standing Cervical Sidebending AROM  - 3-4 x daily - 7 x weekly - 1 sets - 5 reps - 5-10second hold ?- Standing Cervical Extension AROM  - 3-4 x daily - 7 x weekly - 1 sets - 5 reps - 5-10second hold ?- Standing Cervical Retraction  - 3-4 x daily - 7 x weekly - 1 sets - 5 reps - 5-10second hold ?- Seated Cervical Sidebending Stretch  - 2 x daily - 7 x weekly - 1 sets - 3 reps - 20-30 seconds hold ?- Seated Levator Scapulae Stretch  - 2 x daily - 7 x weekly - 1 sets - 3 reps - 20-30 seconds hold ?- Seated Wrist Flexion with Overpressure  - 3-4 x daily - 3-5 reps - 15 sec hold ?- Wrist Prayer Stretch  - 3-4 x daily - 3-5 reps - 15 sec hold ?- Standing Radial Nerve Glide  - 3-4 x daily - 1 sets - 5-10 reps ?- Ulnar Nerve Flossing  - 3-4 x daily - 1 sets - 5-10 reps ?- Seated Shoulder Inferior Glide  - 4 x daily - 1 sets - 3-5 reps - 15 hold ?- Prone W Scapular  Retraction  - 1-2 x daily - 3-5 reps - 15-30 hold ? ?  ?  ?PATIENT EDUCATION: ?Education details: see tx section above for details  ?Person educated: Patient ?Education method: Explanation, Demonstration, and Handouts ?Education comprehension: verbalized understanding, returned demonstration, and needs further education ?  ?  ?HOME EXERCISE PROGRAM: ?Access Code: [email or web address] ?URL: [email or web address] ?Prepared by: Fannie Knee ?  ?  ?GOALS: ?Goals reviewed with patient? Yes ?  ?SHORT TERM GOALS: (STG required  if POC>30 days) ?  ?Pt will demo/state understanding of initial HEP to improve pain levels and prerequisite motion. ?Target date: 11/17/21 ?Goal status: INITIAL ?  ?  ?LONG TERM GOALS: ?  ?Pt will improve functional ability by decreased impairment per Quick DASH assessment from 75% to 15% or better, for better quality of life. ?Target date: 12/16/21 ?Goal status: INITIAL ?  ?2.  Pt will improve grip strength in b/l grip from 65Rt/60Lt lbs to at least 75Rt/70Lt lbs for functional use in work/factory IADLs. ?Target date: 12/16/21 ?Goal status: INITIAL ?  ?3.  Pt will improve A/ROM in cervical flexion & ext from ~10* each to at least 70*/40* respectively, to have functional motion for tasks like driving, dressing, social participation with friends/family.  ?Target date: 12/16/21 ?Goal status: INITIAL ?  ?4.  Pt will improve strength in b/l shoulders from 4-/5 MMT to at least 4+/5 MMT (no pain) to have increased functional ability to carry out selfcare and higher-level homecare tasks with no difficulty. ?Target date: 12/16/21 ?Goal status: INITIAL ?  ?5.  Pt will decrease pain at worst from "Moderate" to 2/10 or better to have better sleep and occupational participation in daily roles. ?Target date: 12/16/21 ?Goal status: INITIAL ?  ?  ?  ?ASSESSMENT: ?  ?CLINICAL IMPRESSION: ?11/11/21: She is still learning HEP and upgrading to neuro management techniques now.  ? ?  ?  ?PLAN: ?OT FREQUENCY: 2x/week ?  ?OT  DURATION: 6 weeks ?  ?PLANNED INTERVENTIONS: self care/ADL training, therapeutic exercise, therapeutic activity, manual therapy, scar mobilization, passive range of motion, splinting, ultrasound, fluidotherapy,

## 2021-11-14 ENCOUNTER — Ambulatory Visit (INDEPENDENT_AMBULATORY_CARE_PROVIDER_SITE_OTHER): Payer: BC Managed Care – PPO | Admitting: Rehabilitative and Restorative Service Providers"

## 2021-11-14 DIAGNOSIS — M25532 Pain in left wrist: Secondary | ICD-10-CM

## 2021-11-14 DIAGNOSIS — M542 Cervicalgia: Secondary | ICD-10-CM | POA: Diagnosis not present

## 2021-11-14 DIAGNOSIS — M6281 Muscle weakness (generalized): Secondary | ICD-10-CM

## 2021-11-14 DIAGNOSIS — R202 Paresthesia of skin: Secondary | ICD-10-CM

## 2021-11-14 NOTE — Therapy (Signed)
?OUTPATIENT OCCUPATIONAL THERAPY TREATMENT NOTE ? ? ?Patient Name: Lindsey Graham ?MRN: [id number] ?DOB:[birth year], 46 y.o., female ?Today's Date: 11/14/2021 ? ?PCP: Eunice Blase, MD ?REFERRING PROVIDER: Magnus Sinning, MD ? ? OT End of Session - 11/14/21 1159   ? ? Visit Number 3   ? Number of Visits 12   ? Date for OT Re-Evaluation 12/16/21   ? OT Start Time 1157   ? OT Stop Time 1235   ? OT Time Calculation (min) 38 min   ? Activity Tolerance Patient tolerated treatment well;No increased pain;Patient limited by fatigue   ? Behavior During Therapy [facility] for tasks assessed/performed   ? ?  ?  ? ?  ? ? ? ?Past Medical History:  ?Diagnosis Date  ? Anxiety   ? Dyspnea   ? with COVID  ? PONV (postoperative nausea and vomiting)   ? ?Past Surgical History:  ?Procedure Laterality Date  ? ANTERIOR CERVICAL DECOMP/DISCECTOMY FUSION N/A 08/09/2021  ? Procedure: ANTERIOR CERVICAL DISCECTOMY FUSION C4-5 AND C5-6 WITH PLATE, SCREWS, ALLOGRAFT, LOCAL BONE GRAFT, VIVIGEN;  Surgeon: Jessy Oto, MD;  Location: [place];  Service: Orthopedics;  Laterality: N/A;  ? EYE SURGERY    ? ?Patient Active Problem List  ? Diagnosis Date Noted  ? Other spondylosis with radiculopathy, cervical region   ? S/P cervical spinal fusion 08/09/2021  ? Herniation of cervical intervertebral disc with radiculopathy 08/09/2021  ? Chronic low back pain 03/26/2020  ? Neck pain 03/26/2020  ? Other chronic pain 03/26/2020  ? Nodule of upper lobe of right lung 10/08/2019  ? Head injury 06/04/2019  ? Post concussion syndrome 06/04/2019  ? Chronic migraine 06/04/2019  ? Back pain 06/04/2019  ? ? ?ONSET DATE: DOS 08/09/21 ?  ?REFERRING DIAG:  ?Z98.1 (ICD-10-CM) - S/P cervical spinal fusion  ?M25.532 (ICD-10-CM) - Pain in left wrist  ? ? ?THERAPY DIAG:  ?No diagnosis found. ? ? ?PERTINENT HISTORY: Per MD: "Left wrist pain dorsal radial likely this is mild chronic sprain, she is post 2 level CDF C4-6 4 months ago. Has fusion by radiographs start cervical spine ROM,  McKenzie exercises, H, M and left UE ROM and strengthening" ? ?PRECAUTIONS: N/A ? ?SUBJECTIVE:  ?She states doing HEP exercises in the car when she takes rides from her husband. Back less painful today but still sore/tight.  Neck isn't hurting too badly, just tight.  ? ?PAIN:  ?Are you having pain? Yes, back is "flared up" and Lt arm is moderately numb right now  ?Rating/Location: Mild today ? ? ?OBJECTIVE: (All objective assessments below are from initial evaluation on: 11/07/21 unless otherwise specified.)  ? ? ?HAND DOMINANCE: Right ?  ?ADLs: ?Overall ADLs: Has been asking for help carrying things, opening containers, has had pain when pushing with left hand/wrist, also difficulty with IADL work tasks, etc.  ?  ?FUNCTIONAL OUTCOME MEASURES: ?Quick Dash: 75% impairment today 11/07/21 ?  ?UE ROM: ?  ?Cervical ROM today 11/07/21: L Rot: 30*, R Rot: 32*, Flexion: 12*, Ext: 10* ?  ?Active ROM Left ?11/07/2021  ?Shoulder flexion    ?Shoulder abduction    ?Shoulder adduction    ?Shoulder extension    ?Shoulder internal rotation    ?Shoulder external rotation    ?Elbow flexion WNL  ?Elbow extension WNL  ?Wrist flexion 55  ?Wrist extension 67  ?(Blank rows = not tested) ?  ?UE MMT:  NT at eval but at least 4-/5 MMT though b/l UEs based on observations/other tests ?  ?  ?  HAND FUNCTION: ?Grip strength: Right: 65 lbs; Left: 60 pain lbs and Lateral pinch: Right: 13 lbs, Left: 10 painful lbs ?  ?COORDINATION: no overt issues, can oppose thumb to all fingers, make full fist, but may need measured due to nerve c/o's  ?9 Hole Peg test: Right: TBD sec; Left: TBD sec ?  ?SENSATION: ?Light touch: WFL ?WFL in hands now, may need monofilament testing, etc., if symptoms extend into fingers. She does state sensory disturbance roughly through radial nerve distribution in left side of neck, shoulder, arm. ?  ?EDEMA: none significant ?  ?  ?OBSERVATIONS: Neck overtly stiff, TTP in distal radial ulnar joint, but not TFCC, not in thumb CMC, she  states tightness in webspace,  negative scaphoid shift test.  ?  ?  ?TODAY'S TREATMENT:  ?11/14/21: Review HEP for head/neck while on MH 5 mins (no skin irritation). Then OT performs manual therapy IASTM and strong trigger point releases to b/l upper traps and Lt levator scapula. Still tight, rolling tendons are felt in neck, but somewhat better now. OT instructs her to add light PROM to head/neck A/ROM motions, she tolerates well. OT also reviews wrist & thumb stretches, adds therapy putty for b/l hand strengthening now in grips, intrinsics, pinches. She tolerates well, states understanding.  ? ?11/11/21: She is given MH for low back pain while seated and on lt side of neck while verbally reviewing HEP and expectations.  Off MH, she performs neck AROM in multiple planes, per HEP, and OT does manual therapy IASTM to upper trap/levator scap, noting rolling, tight tendons and muscle knots. These improve somewhat after manual therapy, and she is educated on new stretches for levator scap, postural retraction with shoulder ER for contract/relax technique, and radial and ulnar nerve glides to help with paresthesias down arm.  She tolerates new exercises well and new neuro techniques well, feeling cool tingle, not putting too much tension on nerves.  ? ?Exercises ?- Standing Cervical Rotation AROM  - 3-4 x daily - 7 x weekly - 1 sets - 5 reps - 5-10seconds hold ?- Standing Cervical Flexion AROM  - 3-4 x daily - 7 x weekly - 1 sets - 5 reps - 5-10 seconds hold ?- Standing Cervical Sidebending AROM  - 3-4 x daily - 7 x weekly - 1 sets - 5 reps - 5-10second hold ?- Standing Cervical Extension AROM  - 3-4 x daily - 7 x weekly - 1 sets - 5 reps - 5-10second hold ?- Standing Cervical Retraction  - 3-4 x daily - 7 x weekly - 1 sets - 5 reps - 5-10second hold ?- Seated Cervical Sidebending Stretch  - 2 x daily - 7 x weekly - 1 sets - 3 reps - 20-30 seconds hold ?- Seated Levator Scapulae Stretch  - 2 x daily - 7 x weekly - 1 sets -  3 reps - 20-30 seconds hold ?- Seated Wrist Flexion with Overpressure  - 3-4 x daily - 3-5 reps - 15 sec hold ?- Wrist Prayer Stretch  - 3-4 x daily - 3-5 reps - 15 sec hold ?- Standing Radial Nerve Glide  - 3-4 x daily - 1 sets - 5-10 reps ?- Ulnar Nerve Flossing  - 3-4 x daily - 1 sets - 5-10 reps ?- Seated Shoulder Inferior Glide  - 4 x daily - 1 sets - 3-5 reps - 15 hold ?- Prone W Scapular Retraction  - 1-2 x daily - 3-5 reps - 15-30 hold ? ?  ?  ?  PATIENT EDUCATION: ?Education details: see tx section above for details  ?Person educated: Patient ?Education method: Explanation, Demonstration, and Handouts ?Education comprehension: verbalized understanding, returned demonstration, and needs further education ?  ?  ?HOME EXERCISE PROGRAM: ?Access Code: [email or web address] ?URL: [email or web address] ?Prepared by: Benito Mccreedy ?  ?  ?GOALS: ?Goals reviewed with patient? Yes ?  ?SHORT TERM GOALS: (STG required if POC>30 days) ?  ?Pt will demo/state understanding of initial HEP to improve pain levels and prerequisite motion. ?Target date: 11/17/21 ?Goal status: INITIAL ?  ?  ?LONG TERM GOALS: ?  ?Pt will improve functional ability by decreased impairment per Quick DASH assessment from 75% to 15% or better, for better quality of life. ?Target date: 12/16/21 ?Goal status: INITIAL ?  ?2.  Pt will improve grip strength in b/l grip from 65Rt/60Lt lbs to at least 75Rt/70Lt lbs for functional use in work/factory IADLs. ?Target date: 12/16/21 ?Goal status: INITIAL ?  ?3.  Pt will improve A/ROM in cervical flexion & ext from ~10* each to at least 70*/40* respectively, to have functional motion for tasks like driving, dressing, social participation with friends/family.  ?Target date: 12/16/21 ?Goal status: INITIAL ?  ?4.  Pt will improve strength in b/l shoulders from 4-/5 MMT to at least 4+/5 MMT (no pain) to have increased functional ability to carry out selfcare and higher-level homecare tasks with no difficulty. ?Target date:  12/16/21 ?Goal status: INITIAL ?  ?5.  Pt will decrease pain at worst from "Moderate" to 2/10 or better to have better sleep and occupational participation in daily roles. ?Target date: 12/16/21 ?Goal statu

## 2021-11-16 ENCOUNTER — Encounter: Payer: Self-pay | Admitting: Internal Medicine

## 2021-11-16 ENCOUNTER — Ambulatory Visit: Payer: BC Managed Care – PPO | Admitting: Internal Medicine

## 2021-11-16 ENCOUNTER — Other Ambulatory Visit (INDEPENDENT_AMBULATORY_CARE_PROVIDER_SITE_OTHER): Payer: BC Managed Care – PPO

## 2021-11-16 VITALS — BP 100/74 | HR 96 | Ht 66.0 in | Wt 173.5 lb

## 2021-11-16 DIAGNOSIS — Z1211 Encounter for screening for malignant neoplasm of colon: Secondary | ICD-10-CM

## 2021-11-16 DIAGNOSIS — K5900 Constipation, unspecified: Secondary | ICD-10-CM | POA: Diagnosis not present

## 2021-11-16 DIAGNOSIS — K625 Hemorrhage of anus and rectum: Secondary | ICD-10-CM | POA: Diagnosis not present

## 2021-11-16 DIAGNOSIS — R1030 Lower abdominal pain, unspecified: Secondary | ICD-10-CM

## 2021-11-16 DIAGNOSIS — K219 Gastro-esophageal reflux disease without esophagitis: Secondary | ICD-10-CM

## 2021-11-16 LAB — CORTISOL: Cortisol, Plasma: 6.8 ug/dL

## 2021-11-16 MED ORDER — LINACLOTIDE 145 MCG PO CAPS: 145.0000 ug | ORAL_CAPSULE | Freq: Every day | ORAL | 2 refills | Status: DC

## 2021-11-16 MED ORDER — ONDANSETRON HCL 8 MG PO TABS: 8.0000 mg | ORAL_TABLET | Freq: Three times a day (TID) | ORAL | 2 refills | Status: DC

## 2021-11-16 NOTE — Patient Instructions (Addendum)
If you are age 48 or older, your body mass index should be between 23-30. Your Body mass index is 28 kg/m?Marland Kitchen If this is out of the aforementioned range listed, please consider follow up with your Primary Care Provider. ? ?If you are age 57 or younger, your body mass index should be between 19-25. Your Body mass index is 28 kg/m?Marland Kitchen If this is out of the aformentioned range listed, please consider follow up with your Primary Care Provider. ? ?Your provider has requested that you go to the basement level for lab work before leaving today. Press "B" on the elevator. The lab is located at the first door on the left as you exit the elevator. ?  ? ?You have been scheduled for a colonoscopy. Please follow written instructions given to you at your visit today.  ?Please pick up your prep supplies at the pharmacy within the next 1-3 days. ?If you use inhalers (even only as needed), please bring them with you on the day of your procedure. ? ? ?We have sent the following medications to your pharmacy for you to pick up at your convenience: ?Zofran 8 mg three times daily as needed.  Linzess 151mcg daily.  ? ?Please start daily fiber supplement like Metamucil or Benefiber. ? ?The [facility] providers would like to encourage you to use [facility] to communicate with providers for non-urgent requests or questions.  Due to long hold times on the telephone, sending your provider a message by [facility] may be a faster and more efficient way to get a response.  Please allow 48 business hours for a response.  Please remember that this is for non-urgent requests.  ? ?Due to recent changes in healthcare laws, you may see the results of your imaging and laboratory studies on MyChart before your provider has had a chance to review them.  We understand that in some cases there may be results that are confusing or concerning to you. Not all laboratory results come back in the same time frame and the provider may be waiting for multiple results in  order to interpret others.  Please give [place] 48 hours in order for your provider to thoroughly review all the results before contacting the office for clarification of your results.  ? ? ?It was a pleasure to see you today! ? ?Thank you for trusting me with your gastrointestinal care!   ? ?[name], MD  ? ?
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
## 2021-11-17 ENCOUNTER — Ambulatory Visit (INDEPENDENT_AMBULATORY_CARE_PROVIDER_SITE_OTHER): Payer: BC Managed Care – PPO | Admitting: Rehabilitative and Restorative Service Providers"

## 2021-11-17 DIAGNOSIS — M6281 Muscle weakness (generalized): Secondary | ICD-10-CM | POA: Diagnosis not present

## 2021-11-17 DIAGNOSIS — M542 Cervicalgia: Secondary | ICD-10-CM | POA: Diagnosis not present

## 2021-11-17 DIAGNOSIS — R202 Paresthesia of skin: Secondary | ICD-10-CM | POA: Diagnosis not present

## 2021-11-17 DIAGNOSIS — M25532 Pain in left wrist: Secondary | ICD-10-CM | POA: Diagnosis not present

## 2021-11-17 LAB — TISSUE TRANSGLUTAMINASE, IGA: (tTG) Ab, IgA: <1 U/mL

## 2021-11-17 LAB — IGA: Immunoglobulin A: 137 mg/dL (ref 47–310)

## 2021-11-17 NOTE — Therapy (Signed)
?OUTPATIENT OCCUPATIONAL THERAPY TREATMENT NOTE ? ? ?Patient Name: Lindsey Graham ?MRN: [id number] ?DOB:1975/12/24, 46 y.o., female ?Today's Date: 11/17/2021 ? ?PCP: Lavada Mesi, MD ?REFERRING PROVIDER: Kerrin Champagne, MD ? ? OT End of Session - 11/17/21 1300   ? ? Visit Number 4   ? Number of Visits 12   ? Date for OT Re-Evaluation 12/16/21   ? OT Start Time 1301   ? OT Stop Time 1349   ? OT Time Calculation (min) 48 min   ? Activity Tolerance Patient tolerated treatment well;No increased pain;Patient limited by fatigue   ? Behavior During Therapy [facility] for tasks assessed/performed   ? ?  ?  ? ?  ? ? ? ? ?Past Medical History:  ?Diagnosis Date  ? Anxiety   ? Concussion   ? Dyspnea   ? with COVID  ? Migraines   ? PONV (postoperative nausea and vomiting)   ? Seasonal allergies   ? ?Past Surgical History:  ?Procedure Laterality Date  ? ANTERIOR CERVICAL DECOMP/DISCECTOMY FUSION N/A 08/09/2021  ? Procedure: ANTERIOR CERVICAL DISCECTOMY FUSION C4-5 AND C5-6 WITH PLATE, SCREWS, ALLOGRAFT, LOCAL BONE GRAFT, VIVIGEN;  Surgeon: Kerrin Champagne, MD;  Location: [facility] OR;  Service: Orthopedics;  Laterality: N/A;  ? EYELID LACERATION REPAIR Right   ? Stye removal  ? ?Patient Active Problem List  ? Diagnosis Date Noted  ? Other spondylosis with radiculopathy, cervical region   ? S/P cervical spinal fusion 08/09/2021  ? Herniation of cervical intervertebral disc with radiculopathy 08/09/2021  ? Chronic low back pain 03/26/2020  ? Neck pain 03/26/2020  ? Other chronic pain 03/26/2020  ? Nodule of upper lobe of right lung 10/08/2019  ? Head injury 06/04/2019  ? Post concussion syndrome 06/04/2019  ? Chronic migraine 06/04/2019  ? Back pain 06/04/2019  ? ? ?ONSET DATE: DOS 08/09/21 ?  ?REFERRING DIAG:  ?Z98.1 (ICD-10-CM) - S/P cervical spinal fusion  ?M25.532 (ICD-10-CM) - Pain in left wrist  ? ? ?THERAPY DIAG:  ?Muscle weakness (generalized) ? ?Cervicalgia ? ?Paresthesia of skin ? ?Pain in left wrist ? ? ?PERTINENT HISTORY: Per MD: "Left  wrist pain dorsal radial likely this is mild chronic sprain, she is post 2 level CDF C4-6 4 months ago. Has fusion by radiographs start cervical spine ROM, McKenzie exercises, H, M and left UE ROM and strengthening" ? ?PRECAUTIONS: N/A ? ?SUBJECTIVE:  ?She states doing EP and nerve glides seem to be helping pain down lt arm. Wrist still bothersome.  ? ?PAIN:  ?Are you having pain? Yes ?Rating: 5/10 today Lt side of neck (& lesser in Rt side) ? ? ?OBJECTIVE: (All objective assessments below are from initial evaluation on: 11/07/21 unless otherwise specified.)  ? ? ?HAND DOMINANCE: Right ?  ?ADLs: ?Overall ADLs: Has been asking for help carrying things, opening containers, has had pain when pushing with left hand/wrist, also difficulty with IADL work tasks, etc.  ?  ?FUNCTIONAL OUTCOME MEASURES: ?Quick Dash: 75% impairment today 11/07/21 ?  ?UE ROM: ?  ?Cervical ROM today 11/07/21: L Rot: 30*, R Rot: 32*, Flexion: 12*, Ext: 10* ?  ?Active ROM Left ?11/07/2021  ?Shoulder flexion    ?Shoulder abduction    ?Shoulder adduction    ?Shoulder extension    ?Shoulder internal rotation    ?Shoulder external rotation    ?Elbow flexion WNL  ?Elbow extension WNL  ?Wrist flexion 55  ?Wrist extension 67  ?(Blank rows = not tested) ?  ?UE MMT:  NT at eval but  at least 4-/5 MMT though b/l UEs based on observations/other tests ?  ?  ?HAND FUNCTION: ?Grip strength: Right: 65 lbs; Left: 60 pain lbs and Lateral pinch: Right: 13 lbs, Left: 10 painful lbs ?  ?COORDINATION: no overt issues, can oppose thumb to all fingers, make full fist, but may need measured due to nerve c/o's  ?9 Hole Peg test: Right: TBD sec; Left: TBD sec ?  ?SENSATION: ?Light touch: WFL ?WFL in hands now, may need monofilament testing, etc., if symptoms extend into fingers. She does state sensory disturbance roughly through radial nerve distribution in left side of neck, shoulder, arm. ?  ?EDEMA: none significant ?  ?  ?OBSERVATIONS: Neck overtly stiff, TTP in distal  radial ulnar joint, but not TFCC, not in thumb CMC, she states tightness in webspace,  negative scaphoid shift test.  ?  ?  ?TODAY'S TREATMENT:  ?11/17/21: She reviews full HEP and OT performs IASTM & MFR to b/l upper trap/levator scap.  She performs/reviews neck ROM and also nerve glides with OT, then is edu on ideas of cardio for at least 30 mins per day and ROM in thoracic spine.  She must be cautious due to chronic back pain. She uses BE for 3.5 mins (2.5 resistance, 40RPM)  for upper chain motion and increased blood flow / endurance. Last she tolerates lying prone to trial prone rowing in ext, horizontal abd, and abduction and tolerates well, no increased pain through neck, etc.  ? ?Exercises today ?- Standing Cervical Rotation AROM  - 3-4 x daily - 7 x weekly - 1 sets - 5 reps - 5-10seconds hold ?- Standing Cervical Flexion AROM  - 3-4 x daily - 7 x weekly - 1 sets - 5 reps - 5-10 seconds hold ?- Standing Cervical Sidebending AROM  - 3-4 x daily - 7 x weekly - 1 sets - 5 reps - 5-10second hold ?- Standing Cervical Extension AROM  - 3-4 x daily - 7 x weekly - 1 sets - 5 reps - 5-10second hold ?- Standing Cervical Retraction  - 3-4 x daily - 7 x weekly - 1 sets - 5 reps - 5-10second hold ?- Seated Cervical Sidebending Stretch  - 2 x daily - 7 x weekly - 1 sets - 3 reps - 20-30 seconds hold ?- Seated Levator Scapulae Stretch  - 2 x daily - 7 x weekly - 1 sets - 3 reps - 20-30 seconds hold ?- Standing Lower Cervical and Upper Thoracic Stretch  - 2 x daily - 7 x weekly - 1 sets - 3 reps - 20-30 seconds hold ?- Cervical Extension Stretch  - 2 x daily - 7 x weekly - 1 sets - 3 reps - 20-30 seconds hold ?- Standing Cervical Flexion Stretch  - 2 x daily - 7 x weekly - 1 sets - 3 reps - 20-30 seconds hold ?- Chin Tuck Against Resistance with Towel   - 3-4 x daily - 1-2 sets - 3-5 reps - 10-30 sec holds hold ?- Prone Scapular Slide with Shoulder Extension  - 2-4 x daily - 1-2 sets - 10-15 reps - 1-2 seconds hold ?- Prone  Scapular Retraction Arms at Side  - 2-4 x daily - 1-2 sets - 10-15 reps - 1-2 sec hold ?- Prone W Scapular Retraction  - 2-4 x daily - 1-2 sets - 10-15 reps - 1-2 sec hold ?- Seated Wrist Flexion with Overpressure  - 3-4 x daily - 3-5 reps - 15 sec hold ?-  Wrist Prayer Stretch  - 3-4 x daily - 3-5 reps - 15 sec hold ?- Standing Radial Nerve Glide  - 3-4 x daily - 1 sets - 5-10 reps ?- Ulnar Nerve Flossing  - 3-4 x daily - 1 sets - 5-10 reps ?- Seated Shoulder Inferior Glide  - 4 x daily - 1 sets - 3-5 reps - 15 hold ?- Full Fist  - 2-3 x daily - 5 reps ?- "Duck Mouth" Strength  - 2-3 x daily - 5 reps ?- Finger Extension "Pizza!"   - 2-3 x daily - 5 reps ?- Thumb Press  - 2-3 x daily - 5 reps ?- Cutting Putty  - 2-3 x daily - 5 reps ? ?11/14/21: Review HEP for head/neck while on MH 5 mins (no skin irritation). Then OT performs manual therapy IASTM and strong trigger point releases to b/l upper traps and Lt levator scapula. Still tight, rolling tendons are felt in neck, but somewhat better now. OT instructs her to add light PROM to head/neck A/ROM motions, she tolerates well. OT also reviews wrist & thumb stretches, adds therapy putty for b/l hand strengthening now in grips, intrinsics, pinches. She tolerates well, states understanding.  ? ?11/11/21: She is given MH for low back pain while seated and on lt side of neck while verbally reviewing HEP and expectations.  Off MH, she performs neck AROM in multiple planes, per HEP, and OT does manual therapy IASTM to upper trap/levator scap, noting rolling, tight tendons and muscle knots. These improve somewhat after manual therapy, and she is educated on new stretches for levator scap, postural retraction with shoulder ER for contract/relax technique, and radial and ulnar nerve glides to help with paresthesias down arm.  She tolerates new exercises well and new neuro techniques well, feeling cool tingle, not putting too much tension on nerves.  ? ?Exercises ?- Standing  Cervical Rotation AROM  - 3-4 x daily - 7 x weekly - 1 sets - 5 reps - 5-10seconds hold ?- Standing Cervical Flexion AROM  - 3-4 x daily - 7 x weekly - 1 sets - 5 reps - 5-10 seconds hold ?- Standing Cervical Sideben

## 2021-11-22 ENCOUNTER — Encounter: Payer: Self-pay | Admitting: Rehabilitative and Restorative Service Providers"

## 2021-11-22 ENCOUNTER — Ambulatory Visit (INDEPENDENT_AMBULATORY_CARE_PROVIDER_SITE_OTHER): Payer: BC Managed Care – PPO | Admitting: Rehabilitative and Restorative Service Providers"

## 2021-11-22 DIAGNOSIS — M6281 Muscle weakness (generalized): Secondary | ICD-10-CM

## 2021-11-22 DIAGNOSIS — M25532 Pain in left wrist: Secondary | ICD-10-CM

## 2021-11-22 DIAGNOSIS — R202 Paresthesia of skin: Secondary | ICD-10-CM | POA: Diagnosis not present

## 2021-11-22 DIAGNOSIS — M542 Cervicalgia: Secondary | ICD-10-CM

## 2021-11-22 NOTE — Therapy (Signed)
?OUTPATIENT OCCUPATIONAL THERAPY TREATMENT NOTE ? ? ?Patient Name: Lindsey Graham ?MRN: [id number] ?DOB:August 16, 1975, 46 y.o., female ?Today's Date: 11/22/2021 ? ?PCP: Lavada Mesi, MD ?REFERRING PROVIDER: Kerrin Champagne, MD ? ? OT End of Session - 11/22/21 1106   ? ? Visit Number 5   ? Number of Visits 12   ? Date for OT Re-Evaluation 12/16/21   ? OT Start Time 1106   ? OT Stop Time 1150   ? OT Time Calculation (min) 44 min   ? Activity Tolerance Patient tolerated treatment well;No increased pain;Patient limited by fatigue;Patient limited by pain   ? Behavior During Therapy [facility] for tasks assessed/performed   ? ?  ?  ? ?  ? ? ? ? ? ?Past Medical History:  ?Diagnosis Date  ? Anxiety   ? Concussion   ? Dyspnea   ? with COVID  ? Migraines   ? PONV (postoperative nausea and vomiting)   ? Seasonal allergies   ? ?Past Surgical History:  ?Procedure Laterality Date  ? ANTERIOR CERVICAL DECOMP/DISCECTOMY FUSION N/A 08/09/2021  ? Procedure: ANTERIOR CERVICAL DISCECTOMY FUSION C4-5 AND C5-6 WITH PLATE, SCREWS, ALLOGRAFT, LOCAL BONE GRAFT, VIVIGEN;  Surgeon: Kerrin Champagne, MD;  Location: [facility] OR;  Service: Orthopedics;  Laterality: N/A;  ? EYELID LACERATION REPAIR Right   ? Stye removal  ? ?Patient Active Problem List  ? Diagnosis Date Noted  ? Other spondylosis with radiculopathy, cervical region   ? S/P cervical spinal fusion 08/09/2021  ? Herniation of cervical intervertebral disc with radiculopathy 08/09/2021  ? Chronic low back pain 03/26/2020  ? Neck pain 03/26/2020  ? Other chronic pain 03/26/2020  ? Nodule of upper lobe of right lung 10/08/2019  ? Head injury 06/04/2019  ? Post concussion syndrome 06/04/2019  ? Chronic migraine 06/04/2019  ? Back pain 06/04/2019  ? ? ?ONSET DATE: DOS 08/09/21 ?  ?REFERRING DIAG:  ?Z98.1 (ICD-10-CM) - S/P cervical spinal fusion  ?M25.532 (ICD-10-CM) - Pain in left wrist  ? ? ?THERAPY DIAG:  ?Cervicalgia ? ?Paresthesia of skin ? ?Muscle weakness (generalized) ? ?Pain in left wrist ? ? ?PERTINENT  HISTORY: Per MD: "Left wrist pain dorsal radial likely this is mild chronic sprain, she is post 2 level CDF C4-6 4 months ago. Has fusion by radiographs start cervical spine ROM, McKenzie exercises, H, M and left UE ROM and strengthening" ? ?PRECAUTIONS: N/A ? ?SUBJECTIVE:  ?She states having sharper pain since last visit, unsure what caused it, possibly new strength for scapula. She states withholding them, tying pain relief and resting more, feeling better now (was painful past ~3 days).  ? ? ?PAIN:  ?Are you having pain? Yes ?Rating: 5-6/10 today Lt side of neck (& lesser in Rt side) ? ? ?OBJECTIVE: (All objective assessments below are from initial evaluation on: 11/07/21 unless otherwise specified.)  ? ? ?HAND DOMINANCE: Right ?  ?ADLs: ?Overall ADLs: Has been asking for help carrying things, opening containers, has had pain when pushing with left hand/wrist, also difficulty with IADL work tasks, etc.  ?  ?FUNCTIONAL OUTCOME MEASURES: ?Quick Dash: 75% impairment today 11/07/21 ?  ?UE ROM: ?  ?Cervical ROM today 11/07/21: L Rot: 30*, R Rot: 32*, Flexion: 12*, Ext: 10* ?11/22/21: L Rot: 29*, R Rot: 27*, Flexion: 28*, Ext: 19* ?  ?Active ROM Left ?11/07/2021 Left ?11/22/21  ?Shoulder flexion     ?Shoulder abduction     ?Shoulder adduction     ?Shoulder extension     ?Shoulder internal rotation     ?  Shoulder external rotation     ?Elbow flexion WNL   ?Elbow extension WNL   ?Wrist flexion 55 63  ?Wrist extension 67 70  ?(Blank rows = not tested) ?  ?UE MMT:  NT at eval but at least 4-/5 MMT though b/l UEs based on observations/other tests ?  ?  ?HAND FUNCTION: ?Grip strength: Right: 65 lbs; Left: 60 pain lbs and Lateral pinch: Right: 13 lbs, Left: 10 painful lbs ?  ?COORDINATION: no overt issues, can oppose thumb to all fingers, make full fist, but may need measured due to nerve c/o's  ?9 Hole Peg test: Right: TBD sec; Left: TBD sec ?  ?SENSATION: ?Light touch: WFL ?WFL in hands now, may need monofilament testing, etc., if  symptoms extend into fingers. She does state sensory disturbance roughly through radial nerve distribution in left side of neck, shoulder, arm. ?  ?EDEMA: none significant ?  ?  ?OBSERVATIONS: Neck overtly stiff, TTP in distal radial ulnar joint, but not TFCC, not in thumb CMC, she states tightness in webspace,  negative scaphoid shift test.  ?  ?  ?TODAY'S TREATMENT:  ?11/22/21: Neck and wrist measures taken today for check and flex/ext of neck and wrist much better now. Rotations of head are slightly reduced and aching. Due to increased pain, OT educated on adapting HEP for more painful days, including not strengthening, just sticking to light AROM, etc.OT also performs manual IASTM, MFR, trigger point relase to upper traps, lev scap, also pt in supine very light neck/cervical traction, also review nerve glides and how to decrease intensity of HEP for painful days. Further edu on resting as needed, bolstering neck in bed to allow muscles to relax. She performs caudal glides and light AROM at neck at end, tolerating a bit better, but still sore.  ? ?11/17/21: She reviews full HEP and OT performs IASTM & MFR to b/l upper trap/levator scap.  She performs/reviews neck ROM and also nerve glides with OT, then is edu on ideas of cardio for at least 30 mins per day and ROM in thoracic spine.  She must be cautious due to chronic back pain. She uses BE for 3.5 mins (2.5 resistance, 40RPM)  for upper chain motion and increased blood flow / endurance. Last she tolerates lying prone to trial prone rowing in ext, horizontal abd, and abduction and tolerates well, no increased pain through neck, etc.  ? ?Exercises today ?- Standing Cervical Rotation AROM  - 3-4 x daily - 7 x weekly - 1 sets - 5 reps - 5-10seconds hold ?- Standing Cervical Flexion AROM  - 3-4 x daily - 7 x weekly - 1 sets - 5 reps - 5-10 seconds hold ?- Standing Cervical Sidebending AROM  - 3-4 x daily - 7 x weekly - 1 sets - 5 reps - 5-10second hold ?- Standing  Cervical Extension AROM  - 3-4 x daily - 7 x weekly - 1 sets - 5 reps - 5-10second hold ?- Standing Cervical Retraction  - 3-4 x daily - 7 x weekly - 1 sets - 5 reps - 5-10second hold ?- Seated Cervical Sidebending Stretch  - 2 x daily - 7 x weekly - 1 sets - 3 reps - 20-30 seconds hold ?- Seated Levator Scapulae Stretch  - 2 x daily - 7 x weekly - 1 sets - 3 reps - 20-30 seconds hold ?- Standing Lower Cervical and Upper Thoracic Stretch  - 2 x daily - 7 x weekly - 1 sets -  3 reps - 20-30 seconds hold ?- Cervical Extension Stretch  - 2 x daily - 7 x weekly - 1 sets - 3 reps - 20-30 seconds hold ?- Standing Cervical Flexion Stretch  - 2 x daily - 7 x weekly - 1 sets - 3 reps - 20-30 seconds hold ?- Chin Tuck Against Resistance with Towel   - 3-4 x daily - 1-2 sets - 3-5 reps - 10-30 sec holds hold ?- Prone Scapular Slide with Shoulder Extension  - 2-4 x daily - 1-2 sets - 10-15 reps - 1-2 seconds hold ?- Prone Scapular Retraction Arms at Side  - 2-4 x daily - 1-2 sets - 10-15 reps - 1-2 sec hold ?- Prone W Scapular Retraction  - 2-4 x daily - 1-2 sets - 10-15 reps - 1-2 sec hold ?- Seated Wrist Flexion with Overpressure  - 3-4 x daily - 3-5 reps - 15 sec hold ?- Wrist Prayer Stretch  - 3-4 x daily - 3-5 reps - 15 sec hold ?- Standing Radial Nerve Glide  - 3-4 x daily - 1 sets - 5-10 reps ?- Ulnar Nerve Flossing  - 3-4 x daily - 1 sets - 5-10 reps ?- Seated Shoulder Inferior Glide  - 4 x daily - 1 sets - 3-5 reps - 15 hold ?- Full Fist  - 2-3 x daily - 5 reps ?- "Duck Mouth" Strength  - 2-3 x daily - 5 reps ?- Finger Extension "Pizza!"   - 2-3 x daily - 5 reps ?- Thumb Press  - 2-3 x daily - 5 reps ?- Cutting Putty  - 2-3 x daily - 5 reps ? ?11/14/21: Review HEP for head/neck while on MH 5 mins (no skin irritation). Then OT performs manual therapy IASTM and strong trigger point releases to b/l upper traps and Lt levator scapula. Still tight, rolling tendons are felt in neck, but somewhat better now. OT instructs her to  add light PROM to head/neck A/ROM motions, she tolerates well. OT also reviews wrist & thumb stretches, adds therapy putty for b/l hand strengthening now in grips, intrinsics, pinches. She tolerates wel

## 2021-11-25 ENCOUNTER — Ambulatory Visit (INDEPENDENT_AMBULATORY_CARE_PROVIDER_SITE_OTHER): Payer: BC Managed Care – PPO | Admitting: Rehabilitative and Restorative Service Providers"

## 2021-11-25 ENCOUNTER — Encounter: Payer: Self-pay | Admitting: Rehabilitative and Restorative Service Providers"

## 2021-11-25 DIAGNOSIS — M25532 Pain in left wrist: Secondary | ICD-10-CM | POA: Diagnosis not present

## 2021-11-25 DIAGNOSIS — M6281 Muscle weakness (generalized): Secondary | ICD-10-CM

## 2021-11-25 DIAGNOSIS — R202 Paresthesia of skin: Secondary | ICD-10-CM | POA: Diagnosis not present

## 2021-11-25 DIAGNOSIS — M542 Cervicalgia: Secondary | ICD-10-CM | POA: Diagnosis not present

## 2021-11-25 NOTE — Therapy (Signed)
?OUTPATIENT OCCUPATIONAL THERAPY TREATMENT NOTE ? ? ?Patient Name: Lindsey Graham ?MRN: [id number] ?DOB:September 19, 1975, 46 y.o., female ?Today's Date: 11/25/2021 ? ?PCP: Eunice Blase, MD ?REFERRING PROVIDER: Jessy Oto, MD ? ? OT End of Session - 11/25/21 1120   ? ? Visit Number 6   ? Number of Visits 12   ? Date for OT Re-Evaluation 12/16/21   ? OT Start Time 1118   ? OT Stop Time 1157   ? OT Time Calculation (min) 39 min   ? Activity Tolerance Patient tolerated treatment well;No increased pain;Patient limited by fatigue   ? Behavior During Therapy [facility] for tasks assessed/performed   ? ?  ?  ? ?  ? ? ? ? ? ? ?Past Medical History:  ?Diagnosis Date  ? Anxiety   ? Concussion   ? Dyspnea   ? with COVID  ? Migraines   ? PONV (postoperative nausea and vomiting)   ? Seasonal allergies   ? ?Past Surgical History:  ?Procedure Laterality Date  ? ANTERIOR CERVICAL DECOMP/DISCECTOMY FUSION N/A 08/09/2021  ? Procedure: ANTERIOR CERVICAL DISCECTOMY FUSION C4-5 AND C5-6 WITH PLATE, SCREWS, ALLOGRAFT, LOCAL BONE GRAFT, VIVIGEN;  Surgeon: Jessy Oto, MD;  Location: [place];  Service: Orthopedics;  Laterality: N/A;  ? EYELID LACERATION REPAIR Right   ? Stye removal  ? ?Patient Active Problem List  ? Diagnosis Date Noted  ? Other spondylosis with radiculopathy, cervical region   ? S/P cervical spinal fusion 08/09/2021  ? Herniation of cervical intervertebral disc with radiculopathy 08/09/2021  ? Chronic low back pain 03/26/2020  ? Neck pain 03/26/2020  ? Other chronic pain 03/26/2020  ? Nodule of upper lobe of right lung 10/08/2019  ? Head injury 06/04/2019  ? Post concussion syndrome 06/04/2019  ? Chronic migraine 06/04/2019  ? Back pain 06/04/2019  ? ? ?ONSET DATE: DOS 08/09/21 ?  ?REFERRING DIAG:  ?Z98.1 (ICD-10-CM) - S/P cervical spinal fusion  ?M25.532 (ICD-10-CM) - Pain in left wrist  ? ? ?THERAPY DIAG:  ?No diagnosis found. ? ? ?PERTINENT HISTORY: Per MD: "Left wrist pain dorsal radial likely this is mild chronic sprain, she is  post 2 level CDF C4-6 4 months ago. Has fusion by radiographs start cervical spine ROM, McKenzie exercises, H, M and left UE ROM and strengthening" ? ?PRECAUTIONS: now ~15+ weeks since cervical sx ? ?SUBJECTIVE:  ?She states feeling better since last visit, has backed off on [place]'s exercises. She states manual therapy really helped her tightness and issues and would like to continue that.  ? ? ?PAIN:  ?Are you having pain?  Yes ?Rating: 2-3/10 today Lt side of neck (& lesser in Rt side) ? ? ?OBJECTIVE: (All objective assessments below are from initial evaluation on: 11/07/21 unless otherwise specified.)  ? ? ?HAND DOMINANCE: Right ?  ?ADLs: ?Overall ADLs: Has been asking for help carrying things, opening containers, has had pain when pushing with left hand/wrist, also difficulty with IADL work tasks, etc.  ?  ?FUNCTIONAL OUTCOME MEASURES: ?Quick Dash: 75% impairment today 11/07/21 ?  ?UE ROM: ?  ?Cervical ROM today 11/07/21: L Rot: 30*, R Rot: 32*, Flexion: 12*, Ext: 10* ?11/22/21: L Rot: 29*, R Rot: 27*, Flexion: 28*, Ext: 19* ?  ?Active ROM Left ?11/07/2021 Left ?11/22/21  ?Shoulder flexion     ?Shoulder abduction     ?Shoulder adduction     ?Shoulder extension     ?Shoulder internal rotation     ?Shoulder external rotation     ?Elbow flexion  WNL   ?Elbow extension WNL   ?Wrist flexion 55 63  ?Wrist extension 67 70  ?(Blank rows = not tested) ?  ?UE MMT:  NT at eval but at least 4-/5 MMT though b/l UEs based on observations/other tests ?  ?  ?HAND FUNCTION: ?Grip strength: Right: 65 lbs; Left: 60 pain lbs and Lateral pinch: Right: 13 lbs, Left: 10 painful lbs ?  ?COORDINATION: no overt issues, can oppose thumb to all fingers, make full fist, but may need measured due to nerve c/o's  ?9 Hole Peg test: Right: TBD sec; Left: TBD sec ?  ?SENSATION: ?Light touch: WFL ?WFL in hands now, may need monofilament testing, etc., if symptoms extend into fingers. She does state sensory disturbance roughly through radial nerve  distribution in left side of neck, shoulder, arm. ?  ?EDEMA: none significant ?  ?  ?OBSERVATIONS: Neck overtly stiff, TTP in distal radial ulnar joint, but not TFCC, not in thumb CMC, she states tightness in webspace,  negative scaphoid shift test.  ?  ?  ?TODAY'S TREATMENT:  ?11/25/21: OT reviews her HEP and reminds her that it's her responsibility to be doing AROM and light, no-pain stretches to neck in all planes 4-6x day. She agrees. OT also re-educates on caudal glides for neck and scap retraction isometrics to help with scapular pains/muscle knots. OT then does manual IASTM, triggerpoint release to tight levator scap and upper trap b/l but primarily on Lt side orf neck. She has no nervous exacerbation during these. In supine, OT then does light neck distraction with Lt scapular depression for continued manual stretches. Also cross/body posterior capsule stretches with manual MFR under axilla in tight areas to help with radial nerve possible entrapments. She reviews radial nerve gentle glides with OT as well. She finishes with UBE for 5.5 mins (2.5 resistance, 45RPM) to encourage light cardiovascular work, loosening tissues up and down shoulder girdle and build endurance. She leaves with no significant pain or complaints.  ? ?11/22/21: Neck and wrist measures taken today for check and flex/ext of neck and wrist much better now. Rotations of head are slightly reduced and aching. Due to increased pain, OT educated on adapting HEP for more painful days, including not strengthening, just sticking to light AROM, etc.OT also performs manual IASTM, MFR, trigger point relase to upper traps, lev scap, also pt in supine very light neck/cervical traction, also review nerve glides and how to decrease intensity of HEP for painful days. Further edu on resting as needed, bolstering neck in bed to allow muscles to relax. She performs caudal glides and light AROM at neck at end, tolerating a bit better, but still sore.   ? ?11/17/21: She reviews full HEP and OT performs IASTM & MFR to b/l upper trap/levator scap.  She performs/reviews neck ROM and also nerve glides with OT, then is edu on ideas of cardio for at least 30 mins per day and ROM in thoracic spine.  She must be cautious due to chronic back pain. She uses BE for 3.5 mins (2.5 resistance, 40RPM)  for upper chain motion and increased blood flow / endurance. Last she tolerates lying prone to trial prone rowing in ext, horizontal abd, and abduction and tolerates well, no increased pain through neck, etc.  ? ?Exercises today ?- Standing Cervical Rotation AROM  - 3-4 x daily - 7 x weekly - 1 sets - 5 reps - 5-10seconds hold ?- Standing Cervical Flexion AROM  - 3-4 x daily - 7 x  weekly - 1 sets - 5 reps - 5-10 seconds hold ?- Standing Cervical Sidebending AROM  - 3-4 x daily - 7 x weekly - 1 sets - 5 reps - 5-10second hold ?- Standing Cervical Extension AROM  - 3-4 x daily - 7 x weekly - 1 sets - 5 reps - 5-10second hold ?- Standing Cervical Retraction  - 3-4 x daily - 7 x weekly - 1 sets - 5 reps - 5-10second hold ?- Seated Cervical Sidebending Stretch  - 2 x daily - 7 x weekly - 1 sets - 3 reps - 20-30 seconds hold ?- Seated Levator Scapulae Stretch  - 2 x daily - 7 x weekly - 1 sets - 3 reps - 20-30 seconds hold ?- Standing Lower Cervical and Upper Thoracic Stretch  - 2 x daily - 7 x weekly - 1 sets - 3 reps - 20-30 seconds hold ?- Cervical Extension Stretch  - 2 x daily - 7 x weekly - 1 sets - 3 reps - 20-30 seconds hold ?- Standing Cervical Flexion Stretch  - 2 x daily - 7 x weekly - 1 sets - 3 reps - 20-30 seconds hold ?- Chin Tuck Against Resistance with Towel   - 3-4 x daily - 1-2 sets - 3-5 reps - 10-30 sec holds hold ?- Prone Scapular Slide with Shoulder Extension  - 2-4 x daily - 1-2 sets - 10-15 reps - 1-2 seconds hold ?- Prone Scapular Retraction Arms at Side  - 2-4 x daily - 1-2 sets - 10-15 reps - 1-2 sec hold ?- Prone W Scapular Retraction  - 2-4 x daily - 1-2 sets  - 10-15 reps - 1-2 sec hold ?- Seated Wrist Flexion with Overpressure  - 3-4 x daily - 3-5 reps - 15 sec hold ?- Wrist Prayer Stretch  - 3-4 x daily - 3-5 reps - 15 sec hold ?- Standing Radial Nerve Glide  - 3-4

## 2021-11-28 ENCOUNTER — Encounter: Payer: Self-pay | Admitting: Rehabilitative and Restorative Service Providers"

## 2021-11-28 ENCOUNTER — Ambulatory Visit (INDEPENDENT_AMBULATORY_CARE_PROVIDER_SITE_OTHER): Payer: BC Managed Care – PPO | Admitting: Rehabilitative and Restorative Service Providers"

## 2021-11-28 DIAGNOSIS — M25532 Pain in left wrist: Secondary | ICD-10-CM | POA: Diagnosis not present

## 2021-11-28 DIAGNOSIS — M542 Cervicalgia: Secondary | ICD-10-CM

## 2021-11-28 DIAGNOSIS — R202 Paresthesia of skin: Secondary | ICD-10-CM | POA: Diagnosis not present

## 2021-11-28 DIAGNOSIS — M6281 Muscle weakness (generalized): Secondary | ICD-10-CM | POA: Diagnosis not present

## 2021-11-28 NOTE — Therapy (Signed)
?OUTPATIENT OCCUPATIONAL THERAPY TREATMENT NOTE ? ? ?Patient Name: Lindsey Graham ?MRN: [id number] ?DOB:[birth year], 46 y.o., female ?Today's Date: 11/28/2021 ? ?PCP: Eunice Blase, MD ?REFERRING PROVIDER: Jessy Oto, MD ? ? OT End of Session - 11/28/21 1110   ? ? Visit Number 7   ? Number of Visits 12   ? Date for OT Re-Evaluation 12/16/21   ? OT Start Time 1102   ? OT Stop Time 1145   ? OT Time Calculation (min) 43 min   ? Activity Tolerance Patient tolerated treatment well;No increased pain;Patient limited by fatigue   ? Behavior During Therapy [facility] for tasks assessed/performed   ? ?  ?  ? ?  ? ? ? ? ? ? ? ?Past Medical History:  ?Diagnosis Date  ? Anxiety   ? Concussion   ? Dyspnea   ? with COVID  ? Migraines   ? PONV (postoperative nausea and vomiting)   ? Seasonal allergies   ? ?Past Surgical History:  ?Procedure Laterality Date  ? ANTERIOR CERVICAL DECOMP/DISCECTOMY FUSION N/A 08/09/2021  ? Procedure: ANTERIOR CERVICAL DISCECTOMY FUSION C4-5 AND C5-6 WITH PLATE, SCREWS, ALLOGRAFT, LOCAL BONE GRAFT, VIVIGEN;  Surgeon: Jessy Oto, MD;  Location: [place];  Service: Orthopedics;  Laterality: N/A;  ? EYELID LACERATION REPAIR Right   ? Stye removal  ? ?Patient Active Problem List  ? Diagnosis Date Noted  ? Other spondylosis with radiculopathy, cervical region   ? S/P cervical spinal fusion 08/09/2021  ? Herniation of cervical intervertebral disc with radiculopathy 08/09/2021  ? Chronic low back pain 03/26/2020  ? Neck pain 03/26/2020  ? Other chronic pain 03/26/2020  ? Nodule of upper lobe of right lung 10/08/2019  ? Head injury 06/04/2019  ? Post concussion syndrome 06/04/2019  ? Chronic migraine 06/04/2019  ? Back pain 06/04/2019  ? ? ?ONSET DATE: DOS 08/09/21 ?  ?REFERRING DIAG:  ?Z98.1 (ICD-10-CM) - S/P cervical spinal fusion  ?M25.532 (ICD-10-CM) - Pain in left wrist  ? ? ?THERAPY DIAG:  ?Paresthesia of skin ? ?Cervicalgia ? ?Muscle weakness (generalized) ? ?Pain in left wrist ? ? ?PERTINENT HISTORY: Per MD:  "Left wrist pain dorsal radial likely this is mild chronic sprain, she is post 2 level CDF C4-6 4 months ago. Has fusion by radiographs start cervical spine ROM, McKenzie exercises, H, M and left UE ROM and strengthening" ? ?PRECAUTIONS: now ~16+ weeks since cervical sx ? ?SUBJECTIVE:  ?She states, "I'm on a roller coaster. Today I feel a bit worse than the last visit." She also c/o new area of pain: her right clavicle.  OT edu that she could be "overdoing it" on days when she feels better keeping her in a cycle. It is typical to still be having tightness/soreness and she is encouraged to not stress over it. She also related a story about a time early in her recovery when she was reclining and felt the urge to throw up, so she pushed up out of her lift chair, feeling a pain and "pop" in right side of her neck. That event has her worried that she "messed up her surgery."  ? ? ?PAIN:  ?Are you having pain?  Yes ?Rating: 5.5/10 today Lt side of neck (& lesser in Rt side) ? ? ?OBJECTIVE: (All objective assessments below are from initial evaluation on: 11/07/21 unless otherwise specified.)  ? ? ?HAND DOMINANCE: Right ?  ?ADLs: ?Overall ADLs: Has been asking for help carrying things, opening containers, has had pain when pushing with  left hand/wrist, also difficulty with IADL work tasks, [employer] research officer, government.  ?  ?FUNCTIONAL OUTCOME MEASURES: ?Quick Dash: 75% impairment today 11/07/21 ?  ?UE ROM: ?  ?Cervical ROM today 11/07/21: L Rot: 30*, R Rot: 32*, Flexion: 12*, Ext: 10* ?11/22/21: L Rot: 29*, R Rot: 27*, Flexion: 28*, Ext: 19* ?  ?Active ROM Left ?11/07/2021 Left ?11/22/21  ?Shoulder flexion     ?Shoulder abduction     ?Shoulder adduction     ?Shoulder extension     ?Shoulder internal rotation     ?Shoulder external rotation     ?Elbow flexion WNL   ?Elbow extension WNL   ?Wrist flexion 55 63  ?Wrist extension 67 70  ?(Blank rows = not tested) ?  ?UE MMT:  NT at eval but at least 4-/5 MMT though b/l UEs based on observations/other tests ?   ?  ?HAND FUNCTION: ?Grip strength: Right: 65 lbs; Left: 60 pain lbs and Lateral pinch: Right: 13 lbs, Left: 10 painful lbs ?  ?COORDINATION: no overt issues, can oppose thumb to all fingers, make full fist, but may need measured due to nerve c/o's  ?9 Hole Peg test: Right: TBD sec; Left: TBD sec ?  ?SENSATION: ?Light touch: WFL ?WFL in hands now, may need monofilament testing, etc., if symptoms extend into fingers. She does state sensory disturbance roughly through radial nerve distribution in left side of neck, shoulder, arm. ?  ?EDEMA: none significant ?  ?  ?OBSERVATIONS: Neck overtly stiff, TTP in distal radial ulnar joint, but not TFCC, not in thumb CMC, she states tightness in webspace,  negative scaphoid shift test.  ?  ?  ?TODAY'S TREATMENT:  ?11/28/21: Due to continued spasms, complaints of tightness and pain, OT starts with manual therapy (IASTM, MFR, and percussive therapy tool) to B/L upper & mid traps, levator scapula and around Rt shoulder RTC insertion / A/C J. She states feeling some relief, then OT reviews HEP with her and has her perform cap retraction isometrics, head/neck AROM in 3 planes of motion, nerve glides, and also b/l shoulder flexion AROM, abd AROM and horizontal abd AROM with associated head/neck reflexive positioning. She also does 6.5 mins on UBE at 3.0 resistance today, increasing endurance & tolerance. She leaves without c/o of increased pain.  ? ?11/25/21: OT reviews her HEP and reminds her that it's her responsibility to be doing AROM and light, no-pain stretches to neck in all planes 4-6x day. She agrees. OT also re-educates on caudal glides for neck and scap retraction isometrics to help with scapular pains/muscle knots. OT then does manual IASTM, triggerpoint release to tight levator scap and upper trap b/l but primarily on Lt side orf neck. She has no nervous exacerbation during these. In supine, OT then does light neck distraction with Lt scapular depression for continued  manual stretches. Also cross/body posterior capsule stretches with manual MFR under axilla in tight areas to help with radial nerve possible entrapments. She reviews radial nerve gentle glides with OT as well. She finishes with UBE for 5.5 mins (2.5 resistance, 45RPM) to encourage light cardiovascular work, loosening tissues up and down shoulder girdle and build endurance. She leaves with no significant pain or complaints.  ? ?11/22/21: Neck and wrist measures taken today for check and flex/ext of neck and wrist much better now. Rotations of head are slightly reduced and aching. Due to increased pain, OT educated on adapting HEP for more painful days, including not strengthening, just sticking to light AROM, etc.OT also performs manual  IASTM, MFR, trigger point relase to upper traps, lev scap, also pt in supine very light neck/cervical traction, also review nerve glides and how to decrease intensity of HEP for painful days. Further edu on resting as needed, bolstering neck in bed to allow muscles to relax. She performs caudal glides and light AROM at neck at end, tolerating a bit better, but still sore.  ? ?  ?PATIENT EDUCATION: ?Education details: see tx section above for details  ?Person educated: Patient ?Education method: Explanation, Demonstration, and Handouts ?Education comprehension: verbalized understanding, returned demonstration, and needs further education ?  ?  ?HOME EXERCISE PROGRAM: ?Access Code: [email or web address] ?URL: [email or web address] ?Prepared by: Benito Mccreedy ?  ?  ?GOALS: ?Goals reviewed with patient? Yes ?  ?SHORT TERM GOALS: (STG required if POC>30 days) ?  ?Pt will demo/state understanding of initial HEP to improve pain levels and prerequisite motion. ?Target date: 11/17/21 ?Goal status: MET 11/17/21 ?  ?  ?LONG TERM GOALS: ?  ?Pt will improve functional ability by decreased impairment per Quick DASH assessment from 75% to 15% or better, for better quality of life. ?Target date:  12/16/21 ?Goal status: INITIAL ?  ?2.  Pt will improve grip strength in b/l grip from 65Rt/60Lt lbs to at least 75Rt/70Lt lbs for functional use in work/factory IADLs. ?Target date: 12/16/21 ?Goal status: INITIAL ?

## 2021-12-01 ENCOUNTER — Encounter: Payer: Self-pay | Admitting: Rehabilitative and Restorative Service Providers"

## 2021-12-01 ENCOUNTER — Ambulatory Visit (INDEPENDENT_AMBULATORY_CARE_PROVIDER_SITE_OTHER): Payer: BC Managed Care – PPO | Admitting: Rehabilitative and Restorative Service Providers"

## 2021-12-01 DIAGNOSIS — R202 Paresthesia of skin: Secondary | ICD-10-CM | POA: Diagnosis not present

## 2021-12-01 DIAGNOSIS — M25532 Pain in left wrist: Secondary | ICD-10-CM | POA: Diagnosis not present

## 2021-12-01 DIAGNOSIS — M542 Cervicalgia: Secondary | ICD-10-CM | POA: Diagnosis not present

## 2021-12-01 NOTE — Therapy (Signed)
?OUTPATIENT OCCUPATIONAL THERAPY TREATMENT NOTE ? ? ?Patient Name: Lindsey Graham ?MRN: [id number] ?DOB:[birth year], 46 y.o., female ?Today's Date: 12/01/2021 ? ?PCP: Lavada MesiHilts, Michael, MD ?REFERRING PROVIDER: Kerrin ChampagneNitka, James E, MD ? ? OT End of Session - 12/01/21 1113   ? ? Visit Number 8   ? Number of Visits 12   ? Date for OT Re-Evaluation 12/16/21   ? OT Start Time 1113   ? OT Stop Time 1215   ? OT Time Calculation (min) 62 min   ? Activity Tolerance Patient tolerated treatment well;No increased pain;Patient limited by fatigue   ? [facility]/performed   ? ?  ?  ? ?  ? ? ? ? ? ? ? ? ?Past Medical History:  ?Diagnosis Date  ? Anxiety   ? Concussion   ? Dyspnea   ? with COVID  ? Migraines   ? PONV (postoperative nausea and vomiting)   ? Seasonal allergies   ? ?Past Surgical History:  ?Procedure Laterality Date  ? ANTERIOR CERVICAL DECOMP/DISCECTOMY FUSION N/A 08/09/2021  ? Procedure: ANTERIOR CERVICAL DISCECTOMY FUSION C4-5 AND C5-6 WITH PLATE, SCREWS, ALLOGRAFT, LOCAL BONE GRAFT, VIVIGEN;  Surgeon: Kerrin ChampagneNitka, James E, MD;  Location: [facility];  Service: Orthopedics;  Laterality: N/A;  ? EYELID LACERATION REPAIR Right   ? Stye removal  ? ?Patient Active Problem List  ? Diagnosis Date Noted  ? Other spondylosis with radiculopathy, cervical region   ? S/P cervical spinal fusion 08/09/2021  ? Herniation of cervical intervertebral disc with radiculopathy 08/09/2021  ? Chronic low back pain 03/26/2020  ? Neck pain 03/26/2020  ? Other chronic pain 03/26/2020  ? Nodule of upper lobe of right lung 10/08/2019  ? Head injury 06/04/2019  ? Post concussion syndrome 06/04/2019  ? Chronic migraine 06/04/2019  ? Back pain 06/04/2019  ? ? ?ONSET DATE: DOS 08/09/21 ?  ?REFERRING DIAG:  ?Z98.1 (ICD-10-CM) - S/P cervical spinal fusion  ?M25.532 (ICD-10-CM) - Pain in left wrist  ? ? ?THERAPY DIAG:  ?Paresthesia of skin ? ?Cervicalgia ? ?Pain in left wrist ? ? ?PERTINENT HISTORY: Per MD: "Left wrist pain dorsal radial  likely this is mild chronic sprain, she is post 2 level CDF C4-6 4 months ago. Has fusion by radiographs start cervical spine ROM, McKenzie exercises, H, M and left UE ROM and strengthening" ? ?PRECAUTIONS: now ~16+ weeks since cervical sx ? ?SUBJECTIVE:  ?She comes in stating that she had an increase in pain and headaches after last session, but it's calmed down a bit since then. She thinks it was just "workout soreness."  ? ? ?PAIN:  ?Are you having pain?  Yes  ?Rating: 5/10 today Lt side of neck (& lesser in Rt side) ? ? ?OBJECTIVE: (All objective assessments below are from initial evaluation on: 11/07/21 unless otherwise specified.)  ? ? ?HAND DOMINANCE: Right ?  ?ADLs: ?Overall ADLs: Has been asking for help carrying things, opening containers, has had pain when pushing with left hand/wrist, also difficulty with IADL work tasks, etc.  ?  ?FUNCTIONAL OUTCOME MEASURES: ?Quick Dash: 75% impairment today 11/07/21 ?  ?UE ROM: ?  ?Cervical ROM today 11/07/21: L Rot: 30*, R Rot: 32*, Flexion: 12*, Ext: 10* ?11/22/21: L Rot: 29*, R Rot: 27*, Flexion: 28*, Ext: 19* ?  ?Active ROM Left ?11/07/2021 Left ?11/22/21 Left  ?12/01/21 Right  ?12/01/21  ?Shoulder flexion    157* 157*  ?Shoulder abduction    161* 184*  ?Shoulder adduction       ?  Shoulder extension    47* 61*  ?Shoulder internal rotation    44* 53*  ?Shoulder external rotation    90* 100*  ?Elbow flexion WNL     ?Elbow extension WNL     ?Wrist flexion 55 63 63   ?Wrist extension 67 70 70   ?(Blank rows = not tested) ?  ?UE MMT:  NT at eval but at least 4-/5 MMT though b/l UEs based on observations/other tests ?  ?  ?HAND FUNCTION: ?Grip strength: Right: 65 lbs; Left: 60 pain lbs and Lateral pinch: Right: 13 lbs, Left: 10 painful lbs ?  ?COORDINATION: no overt issues, can oppose thumb to all fingers, make full fist, but may need measured due to nerve c/o's  ?9 Hole Peg test: Right: TBD sec; Left: TBD sec ?  ?SENSATION: ?Light touch: WFL ?WFL in hands now, may need  monofilament testing, etc., if symptoms extend into fingers. She does state sensory disturbance roughly through radial nerve distribution in left side of neck, shoulder, arm. ?  ?EDEMA: none significant ?  ?  ?OBSERVATIONS: Neck overtly stiff, TTP in distal radial ulnar joint, but not TFCC, not in thumb CMC, she states tightness in webspace,  negative scaphoid shift test.  ?  ?  ?TODAY'S TREATMENT:  ?12/01/21: OT performs manual therapy to her upper traps, levator scapulae with IASTM, MFR, trigger point releases. She states feeling better after and does a thorough review today of full exericse program from head/neck AROM, light AA/ROM, shoulder posterior capsule stretches, Hughston's prone rowing (as tolerated), nerve glides, and wrist stretches, hand strength.  She also does shoulder AROM for goniometrics and is given additional shoulder stretches in end-range flexion, abd, ext, and new IR sleeper stretches. She performs without pain in clinic today. OT reviews use of modalities and when to do upgraded HEP vs "rainy day protocol" for more sore/tender/painful days. She states understanding that she will have some soreness during her recovery and not to "push it" too much. She is given upgraded red putty to add to yellow putty, as she states that's too easy now.  ? ?Exercises ?- Standing Cervical Rotation AROM  - 3-4 x daily - 7 x weekly - 1 sets - 5 reps - 5-10seconds hold ?- Standing Cervical Flexion AROM  - 3-4 x daily - 7 x weekly - 1 sets - 5 reps - 5-10 seconds hold ?- Standing Cervical Sidebending AROM  - 3-4 x daily - 7 x weekly - 1 sets - 5 reps - 5-10second hold ?- Standing Cervical Extension AROM  - 3-4 x daily - 7 x weekly - 1 sets - 5 reps - 5-10second hold ?- Standing Cervical Retraction  - 3-4 x daily - 7 x weekly - 1 sets - 5 reps - 5-10second hold ?- Seated Shoulder Inferior Glide  - 4 x daily - 1 sets - 3-5 reps - 15 hold ?- Seated Cervical Sidebending Stretch  - 2 x daily - 7 x weekly - 1 sets - 3  reps - 20-30 seconds hold ?- Seated Levator Scapulae Stretch  - 2 x daily - 7 x weekly - 1 sets - 3 reps - 20-30 seconds hold ?- Standing Lower Cervical and Upper Thoracic Stretch  - 2 x daily - 7 x weekly - 1 sets - 3 reps - 20-30 seconds hold ?- Cervical Extension Stretch  - 2 x daily - 7 x weekly - 1 sets - 3 reps - 20-30 seconds hold ?- Standing Cervical Flexion  Stretch  - 2 x daily - 7 x weekly - 1 sets - 3 reps - 20-30 seconds hold ?- Chin Tuck Against Resistance with Towel   - 3-4 x daily - 1-2 sets - 3-5 reps - 10-30 sec holds hold ?- Supine Shoulder Flexion with Dowel  - 2-3 x daily - 1 sets - 3-5 reps - 15 sec hold ?- Sleeper Stretch  - 2-3 x daily - 3-5 reps - 15 hold ?- Standing Shoulder Abduction Slides at Wall  - 2-3 x daily - 1 sets - 3-5 reps - 15 sec hold ?- Doorway Stretches (both arms low, lean in gently)   - 2-3 x daily - 3-5 reps - 15 hold ?- Standing Shoulder Posterior Capsule Stretch  - 2-3 x daily - 3-5 reps - 15 hold ?- Standing Shoulder Internal Rotation Stretch Behind Back  - 2-3 x daily - 3-5 reps - 15 sec hold ?- Prone Scapular Slide with Shoulder Extension  - 2-4 x daily - 1-2 sets - 10-15 reps - 1-2 seconds hold ?- Prone Scapular Retraction Arms at Side  - 2-4 x daily - 1-2 sets - 10-15 reps - 1-2 sec hold ?- Prone W Scapular Retraction  - 2-4 x daily - 1-2 sets - 10-15 reps - 1-2 sec hold ?- Seated Wrist Flexion with Overpressure  - 3-4 x daily - 3-5 reps - 15 sec hold ?- Wrist Prayer Stretch  - 3-4 x daily - 3-5 reps - 15 sec hold ?- Full Fist  - 2-3 x daily - 5 reps ?- "Duck Mouth" Strength  - 2-3 x daily - 5 reps ?- Finger Extension "Pizza!"   - 2-3 x daily - 5 reps ?- Thumb Press  - 2-3 x daily - 5 reps ?- Cutting Putty  - 2-3 x daily - 5 reps ?- Ulnar Nerve Flossing  - 3-4 x daily - 1 sets - 5-10 reps ?- Standing Radial Nerve Glide  - 3-4 x daily - 1 sets - 5-10 reps ? ?11/28/21: Due to continued spasms, complaints of tightness and pain, OT starts with manual therapy (IASTM, MFR,  and percussive therapy tool) to B/L upper & mid traps, levator scapula and around Rt shoulder RTC insertion / A/C J. She states feeling some relief, then OT reviews HEP with her and has her perform cap retractio

## 2021-12-05 ENCOUNTER — Encounter: Payer: Self-pay | Admitting: Internal Medicine

## 2021-12-05 ENCOUNTER — Encounter: Payer: Self-pay | Admitting: Rehabilitative and Restorative Service Providers"

## 2021-12-05 ENCOUNTER — Ambulatory Visit (INDEPENDENT_AMBULATORY_CARE_PROVIDER_SITE_OTHER): Payer: BC Managed Care – PPO | Admitting: Rehabilitative and Restorative Service Providers"

## 2021-12-05 DIAGNOSIS — M542 Cervicalgia: Secondary | ICD-10-CM

## 2021-12-05 DIAGNOSIS — M6281 Muscle weakness (generalized): Secondary | ICD-10-CM

## 2021-12-05 DIAGNOSIS — R202 Paresthesia of skin: Secondary | ICD-10-CM | POA: Diagnosis not present

## 2021-12-05 DIAGNOSIS — M25532 Pain in left wrist: Secondary | ICD-10-CM | POA: Diagnosis not present

## 2021-12-05 NOTE — Therapy (Signed)
?OUTPATIENT OCCUPATIONAL THERAPY TREATMENT NOTE ? ? ?Patient Name: Lindsey Graham ?MRN: [id number] ?DOB:12-04-1975, 46 y.o., female ?Today's Date: 12/05/2021 ? ?PCP: Eunice Blase, MD ?REFERRING PROVIDER: Jessy Oto, MD ? ? OT End of Session - 12/05/21 1058   ? ? Visit Number 9   ? Number of Visits 12   ? Date for OT Re-Evaluation 12/16/21   ? OT Start Time 1059   ? OT Stop Time 1145   ? OT Time Calculation (min) 46 min   ? Activity Tolerance Patient tolerated treatment well;No increased pain;Patient limited by fatigue   ? Behavior During Therapy [facility] for tasks assessed/performed   ? ?  ?  ? ?  ? ? ? ? ? ? ? ? ? ?Past Medical History:  ?Diagnosis Date  ? Anxiety   ? Concussion   ? Dyspnea   ? with COVID  ? Migraines   ? PONV (postoperative nausea and vomiting)   ? Seasonal allergies   ? ?Past Surgical History:  ?Procedure Laterality Date  ? ANTERIOR CERVICAL DECOMP/DISCECTOMY FUSION N/A 08/09/2021  ? Procedure: ANTERIOR CERVICAL DISCECTOMY FUSION C4-5 AND C5-6 WITH PLATE, SCREWS, ALLOGRAFT, LOCAL BONE GRAFT, VIVIGEN;  Surgeon: Jessy Oto, MD;  Location: [place];  Service: Orthopedics;  Laterality: N/A;  ? EYELID LACERATION REPAIR Right   ? Stye removal  ? ?Patient Active Problem List  ? Diagnosis Date Noted  ? Other spondylosis with radiculopathy, cervical region   ? S/P cervical spinal fusion 08/09/2021  ? Herniation of cervical intervertebral disc with radiculopathy 08/09/2021  ? Chronic low back pain 03/26/2020  ? Neck pain 03/26/2020  ? Other chronic pain 03/26/2020  ? Nodule of upper lobe of right lung 10/08/2019  ? Head injury 06/04/2019  ? Post concussion syndrome 06/04/2019  ? Chronic migraine 06/04/2019  ? Back pain 06/04/2019  ? ? ?ONSET DATE: DOS 08/09/21 ?  ?REFERRING DIAG:  ?Z98.1 (ICD-10-CM) - S/P cervical spinal fusion  ?M25.532 (ICD-10-CM) - Pain in left wrist  ? ? ?THERAPY DIAG:  ?Pain in left wrist ? ?Cervicalgia ? ?Paresthesia of skin ? ?Muscle weakness (generalized) ? ? ?PERTINENT HISTORY: Per  MD: "Left wrist pain dorsal radial likely this is mild chronic sprain, she is post 2 level CDF C4-6 4 months ago. Has fusion by radiographs start cervical spine ROM, McKenzie exercises, H, M and left UE ROM and strengthening" ? ?PRECAUTIONS: now 4-5 months since cervical sx ? ?SUBJECTIVE:  ?She states doing parts of HEP periodically as asked, pain and numbness still present as before.  ? ?PAIN:  ?Are you having pain? Yes  ?Rating: 5/10 today Lt side of neck (& lesser in Rt side) ? ? ?OBJECTIVE: (All objective assessments below are from initial evaluation on: 11/07/21 unless otherwise specified.)  ? ? ?HAND DOMINANCE: Right ?  ?ADLs: ?Overall ADLs: Has been asking for help carrying things, opening containers, has had pain when pushing with left hand/wrist, also difficulty with IADL work tasks, etc.  ?  ?FUNCTIONAL OUTCOME MEASURES: ?Quick Dash: 75% impairment today 11/07/21 ?  ?UE ROM: ?  ?Cervical ROM today 11/07/21: L Rot: 30*, R Rot: 32*, Flexion: 12*, Ext: 10* ?11/22/21: L Rot: 29*, R Rot: 27*, Flexion: 28*, Ext: 19* ?  ?Active ROM Left ?11/07/2021 Left ?11/22/21 Left  ?12/01/21 Right  ?12/01/21  ?Shoulder flexion    157* 157*  ?Shoulder abduction    161* 184*  ?Shoulder adduction       ?Shoulder extension    47* 61*  ?Shoulder internal  rotation    44* 53*  ?Shoulder external rotation    90* 100*  ?Elbow flexion WNL     ?Elbow extension WNL     ?Wrist flexion 55 63 63   ?Wrist extension 67 70 70   ?(Blank rows = not tested) ?  ?UE MMT:  NT at eval but at least 4-/5 MMT though b/l UEs based on observations/other tests ?  ?  ?HAND FUNCTION: ?Grip strength: Right: 65 lbs; Left: 60 pain lbs and Lateral pinch: Right: 13 lbs, Left: 10 painful lbs ?  ?COORDINATION: no overt issues, can oppose thumb to all fingers, make full fist, but may need measured due to nerve c/o's  ?9 Hole Peg test: Right: TBD sec; Left: TBD sec ?  ?SENSATION: ?Light touch: WFL ?WFL in hands now, may need monofilament testing, etc., if symptoms extend  into fingers. She does state sensory disturbance roughly through radial nerve distribution in left side of neck, shoulder, arm. ?  ?EDEMA: none significant ?  ?  ?OBSERVATIONS: Neck overtly stiff, TTP in distal radial ulnar joint, but not TFCC, not in thumb CMC, she states tightness in webspace,  negative scaphoid shift test.  ?  ?  ?TODAY'S TREATMENT:  ?12/05/21: OT starts with 7 mins continuous [place] 0.5 w/cm^2 to upper trap/lev scap on Lt side, does trigger point manual release technique (still large, palpable knot is felt today)- she states being open to try dry needling now, if MD permits with a PT. OT then guides her through complete HEP from head/neck to shoulder stretches to motion/nerve glides, etc. Also tolerates prone rowing "I" position and UBE 4.0 resistance for 40RPM for 3 mins total today. She feels no extra pain or tingling at end of session. She is encouraged to go and be very active for the rest of the day as tolerated.  ? ?- Standing Cervical Rotation AROM  - 3-4 x daily - 7 x weekly - 1 sets - 5 reps - 5-10seconds hold ?- Standing Cervical Flexion AROM  - 3-4 x daily - 7 x weekly - 1 sets - 5 reps - 5-10 seconds hold ?- Standing Cervical Sidebending AROM  - 3-4 x daily - 7 x weekly - 1 sets - 5 reps - 5-10second hold ?- Standing Cervical Extension AROM  - 3-4 x daily - 7 x weekly - 1 sets - 5 reps - 5-10second hold ?- Supine Shoulder Flexion with Dowel  - 2-3 x daily - 1 sets - 3-5 reps - 15 sec hold ?- Sleeper Stretch  - 2-3 x daily - 3-5 reps - 15 hold ?- Standing Shoulder Abduction Slides at Wall  - 2-3 x daily - 1 sets - 3-5 reps - 15 sec hold ?- Doorway Stretches (both arms low, lean in gently)   - 2-3 x daily - 3-5 reps - 15 hold ?- Standing Shoulder Posterior Capsule Stretch  - 2-3 x daily - 3-5 reps - 15 hold ?- Standing Shoulder Internal Rotation Stretch Behind Back  - 2-3 x daily - 3-5 reps - 15 sec hold ?- Prone Scapular Retraction Arms at Side  - 2-4 x daily - 1-2 sets - 10-15 reps - 1-2  sec hold ?- Ulnar Nerve Flossing  - 3-4 x daily - 1 sets - 5-10 reps ?- Standing Radial Nerve Glide  - 3-4 x daily - 1 sets - 5-10 reps ? ?  ?PATIENT EDUCATION: ?Education details: see tx section above for details  ?Person educated: Patient ?Education method: Explanation,  Demonstration, and Handouts ?Education comprehension: verbalized understanding, returned demonstration, and needs further education ?  ?  ?HOME EXERCISE PROGRAM: ?Access Code: [email or web address] ?URL: [email or web address] ?Prepared by: Benito Mccreedy ?  ?  ?GOALS: ?Goals reviewed with patient? Yes ?  ?SHORT TERM GOALS: (STG required if POC>30 days) ?  ?Pt will demo/state understanding of initial HEP to improve pain levels and prerequisite motion. ?Target date: 11/17/21 ?Goal status: MET 11/17/21 ?  ?  ?LONG TERM GOALS: ?  ?Pt will improve functional ability by decreased impairment per Quick DASH assessment from 75% to 15% or better, for better quality of life. ?Target date: 12/16/21 ?Goal status: INITIAL ?  ?2.  Pt will improve grip strength in b/l grip from 65Rt/60Lt lbs to at least 75Rt/70Lt lbs for functional use in work/factory IADLs. ?Target date: 12/16/21 ?Goal status: INITIAL ?  ?3.  Pt will improve A/ROM in cervical flexion & ext from ~10* each to at least 70*/40* respectively, to have functional motion for tasks like driving, dressing, social participation with friends/family.  ?Target date: 12/16/21 ?Goal status: INITIAL ?  ?4.  Pt will improve strength in b/l shoulders from 4-/5 MMT to at least 4+/5 MMT (no pain) to have increased functional ability to carry out selfcare and higher-level homecare tasks with no difficulty. ?Target date: 12/16/21 ?Goal status: INITIAL ?  ?5.  Pt will decrease pain at worst from "Moderate" to 2/10 or better to have better sleep and occupational participation in daily roles. ?Target date: 12/16/21 ?Goal status: INITIAL ?  ?  ?  ?ASSESSMENT: ?  ?CLINICAL IMPRESSION: ?12/05/21: She is doing well, tolerating more  activities with not increased pain/symptoms, but she doe seem/state anxiety about continued tingling and tightness in neck and down arm. OT assures her that that is somewhat typical after neck sx.  ? ?12/01/21: T

## 2021-12-07 ENCOUNTER — Encounter: Payer: Self-pay | Admitting: Physical Medicine and Rehabilitation

## 2021-12-07 ENCOUNTER — Ambulatory Visit: Payer: Self-pay

## 2021-12-07 ENCOUNTER — Ambulatory Visit (INDEPENDENT_AMBULATORY_CARE_PROVIDER_SITE_OTHER): Payer: BC Managed Care – PPO | Admitting: Physical Medicine and Rehabilitation

## 2021-12-07 VITALS — BP 114/81 | HR 98

## 2021-12-07 DIAGNOSIS — M5416 Radiculopathy, lumbar region: Secondary | ICD-10-CM | POA: Diagnosis not present

## 2021-12-07 MED ORDER — METHYLPREDNISOLONE ACETATE 80 MG/ML IJ SUSP
80.0000 mg | Freq: Once | INTRAMUSCULAR | Status: AC
  Administered 2021-12-07: 80 mg

## 2021-12-07 NOTE — Progress Notes (Signed)
? ?Lindsey Graham - 46 y.o. female MRN [id number]  Date of birth: 1975/10/16 ? ?Office Visit Note: ?Visit Date: 12/07/2021 ?PCP: Lavada Mesi, MD ?Referred by: Lavada Mesi, MD ? ?Subjective: ?Chief Complaint  ?Patient presents with  ? Lower Back - Pain  ? Right Leg - Pain  ? ?HPI:  Lindsey Graham is a 46 y.o. female who comes in today at the request of Dr. [name] for planned Right L5-S1 Lumbar Transforaminal epidural steroid injection with fluoroscopic guidance.  The patient has failed conservative care including home exercise, medications, time and activity modification.  This injection will be diagnostic and hopefully therapeutic.  Please see requesting physician notes for further details and justification. MRI reviewed with images and spine model.  MRI reviewed in the note below. S1 transitional segment almost fully lumbarized. ? ? ?ROS Otherwise per HPI. ? ?Assessment & Plan: ?Visit Diagnoses:  ?  ICD-10-CM   ?1. Lumbar radiculopathy  M54.16 XR C-ARM NO REPORT  ?  Epidural Steroid injection  ?  methylPREDNISolone acetate (DEPO-MEDROL) injection 80 mg  ?  ?  ?Plan: No additional findings.  ? ?Meds & Orders:  ?Meds ordered this encounter  ?Medications  ? methylPREDNISolone acetate (DEPO-MEDROL) injection 80 mg  ?  ?Orders Placed This Encounter  ?Procedures  ? XR C-ARM NO REPORT  ? Epidural Steroid injection  ?  ?Follow-up: Return for visit to requesting provider as needed.  ? ?Procedures: ?No procedures performed  ?Lumbosacral Transforaminal Epidural Steroid Injection - Sub-Pedicular Approach with Fluoroscopic Guidance ? ?Patient: Lindsey Graham      ?Date of Birth: 12/10/1975 ?MRN: [id number] ?PCP: Lavada Mesi, MD      ?Visit Date: 12/07/2021 ?  ?Universal Protocol:    ?Date/Time: 12/07/2021 ? ?Consent Given By: the patient ? ?Position: PRONE ? ?Additional Comments: ?Vital signs were monitored before and after the procedure. ?Patient was prepped and draped in the usual sterile fashion. ?The correct patient,  procedure, and site was verified. ? ? ?Injection Procedure Details:  ? ?Procedure diagnoses: Lumbar radiculopathy [M54.16]   ? ?Meds Administered:  ?Meds ordered this encounter  ?Medications  ? methylPREDNISolone acetate (DEPO-MEDROL) injection 80 mg  ? ? ?Laterality: Right ? ?Location/Site: L5, Lumbarized S1 transitional segment ? ?Needle:5.0 in., 22 ga.  Short bevel or Quincke spinal needle ? ?Needle Placement: Transforaminal ? ?Findings: ?  ? -Comments: Excellent flow of contrast along the nerve, nerve root and into the epidural space. ? ?Procedure Details: ?After squaring off the end-plates to get a true AP view, the C-arm was positioned so that an oblique view of the foramen as noted above was visualized. The target area is just inferior to the "nose of the scotty dog" or sub pedicular. The soft tissues overlying this structure were infiltrated with 2-3 ml. of 1% Lidocaine without Epinephrine. ? ?The spinal needle was inserted toward the target using a "trajectory" view along the fluoroscope beam.  Under AP and lateral visualization, the needle was advanced so it did not puncture dura and was located close the 6 O'Clock position of the pedical in AP tracterory. Biplanar projections were used to confirm position. Aspiration was confirmed to be negative for CSF and/or blood. A 1-2 ml. volume of Isovue-250 was injected and flow of contrast was noted at each level. Radiographs were obtained for documentation purposes.  ? ?After attaining the desired flow of contrast documented above, a 0.5 to 1.0 ml test dose of 0.25% Marcaine was injected into each respective transforaminal space.  The patient was observed for  90 seconds post injection.  After no sensory deficits were reported, and normal lower extremity motor function was noted,   the above injectate was administered so that equal amounts of the injectate were placed at each foramen (level) into the transforaminal epidural space. ? ? ?Additional Comments:  ?The  patient tolerated the procedure well ?Dressing: 2 x 2 sterile gauze and Band-Aid ?  ? ?Post-procedure details: ?Patient was observed during the procedure. ?Post-procedure instructions were reviewed. ? ?Patient left the clinic in stable condition. ?   ? ?Clinical History: ?MRI LUMBAR SPINE WITHOUT CONTRAST ?  ?TECHNIQUE: ?Multiplanar, multisequence MR imaging of the lumbar spine was ?performed. No intravenous contrast was administered. ?  ?COMPARISON:  Lumbar MRI 10/27/2019 ?  ?FINDINGS: ?Segmentation: S1 is a fully lumbarized vertebral body. There is a ?normal disc space at S1-2. Numbering consistent with the prior MRI ?report. ?  ?Alignment:  Mild retrolisthesis L3-4, L4-5, L5-S1. ?  ?Vertebrae:  Normal bone marrow.  Negative for fracture or mass. ?  ?Conus medullaris and cauda equina: Conus extends to the L1-2 level. ?Conus and cauda equina appear normal. ?  ?Paraspinal and other soft tissues: Negative for paraspinous mass or ?adenopathy. ?  ?Disc levels: ?  ?L1-2: Negative ?  ?L2-3: Negative ?  ?L3-4: Negative ?  ?L4-5: Mild disc bulging with small central disc protrusion unchanged ?from prior. Mild facet hypertrophy. Negative for spinal or foraminal ?stenosis. ?  ?L5-S1: Shallow broad-based disc protrusion. Central annular fissure ?with small central disc protrusion unchanged. Bilateral facet and ?ligamentum flavum hypertrophy. Mild spinal stenosis and moderate ?subarticular stenosis bilaterally. No interval change. ?  ?S1-2: Negative ?  ?IMPRESSION: ?S1 is a fully lumbarized vertebra. ?  ?Disc bulging and small central disc protrusion L4-5 unchanged ?  ?Shallow central disc protrusion L5-S1 with bilateral facet ?hypertrophy. Mild spinal stenosis and moderate subarticular stenosis ?bilaterally, without interval change. ?  ?  ?Electronically Signed ?  By: Marlan Palau M.D. ?  On: 07/02/2020 15:01  ? ? ? ?Objective:  VS:  HT:    WT:   BMI:     BP:114/81  HR:98bpm  TEMP: ( )  RESP:  ?Physical Exam ?Vitals  and nursing note reviewed.  ?Constitutional:   ?   General: She is not in acute distress. ?   Appearance: Normal appearance. She is not ill-appearing.  ?HENT:  ?   Head: Normocephalic and atraumatic.  ?   Right Ear: External ear normal.  ?   Left Ear: External ear normal.  ?Eyes:  ?   Extraocular Movements: Extraocular movements intact.  ?Cardiovascular:  ?   Rate and Rhythm: Normal rate.  ?   Pulses: Normal pulses.  ?Pulmonary:  ?   Effort: Pulmonary effort is normal. No respiratory distress.  ?Abdominal:  ?   General: There is no distension.  ?   Palpations: Abdomen is soft.  ?Musculoskeletal:     ?   General: Tenderness present.  ?   Cervical back: Neck supple.  ?   Right lower leg: No edema.  ?   Left lower leg: No edema.  ?   Comments: Patient has good distal strength with no pain over the greater trochanters.  No clonus or focal weakness.  ?Skin: ?   Findings: No erythema, lesion or rash.  ?Neurological:  ?   General: No focal deficit present.  ?   Mental Status: She is alert and oriented to person, place, and time.  ?   Sensory: No sensory deficit.  ?  Motor: No weakness or abnormal muscle tone.  ?   Coordination: Coordination normal.  ?Psychiatric:     ?   Mood and Affect: Mood normal.     ?   Behavior: Behavior normal.  ?  ? ?Imaging: ?No results found. ?

## 2021-12-07 NOTE — Progress Notes (Signed)
Pt state lower back pain that travels right leg. Pt state walking, standing and laying down makes the pain worse. Pt state she takes pain meds and uses ice to help ease her pain. ? ?Numeric Pain Rating Scale and Functional Assessment ?Average Pain 5 ? ? ?In the last MONTH (on 0-10 scale) has pain interfered with the following? ? ?1. General activity like being  able to carry out your everyday physical activities such as walking, climbing stairs, carrying groceries, or moving a chair?  ?Rating(7) ? ? ?+Driver, -BT, -Dye Allergies. ? ?

## 2021-12-07 NOTE — Procedures (Signed)
Lumbosacral Transforaminal Epidural Steroid Injection - Sub-Pedicular Approach with Fluoroscopic Guidance ? ?Patient: Lindsey Graham      ?Date of Birth: April 16, 1976 ?MRN: [id number] ?PCP: Eunice Blase, MD      ?Visit Date: 12/07/2021 ?  ?Universal Protocol:    ?Date/Time: 12/07/2021 ? ?Consent Given By: the patient ? ?Position: PRONE ? ?Additional Comments: ?Vital signs were monitored before and after the procedure. ?Patient was prepped and draped in the usual sterile fashion. ?The correct patient, procedure, and site was verified. ? ? ?Injection Procedure Details:  ? ?Procedure diagnoses: Lumbar radiculopathy [M54.16]   ? ?Meds Administered:  ?Meds ordered this encounter  ?Medications  ? methylPREDNISolone acetate (DEPO-MEDROL) injection 80 mg  ? ? ?Laterality: Right ? ?Location/Site: L5, Lumbarized S1 transitional segment ? ?Needle:5.0 in., 22 ga.  Short bevel or Quincke spinal needle ? ?Needle Placement: Transforaminal ? ?Findings: ?  ? -Comments: Excellent flow of contrast along the nerve, nerve root and into the epidural space. ? ?Procedure Details: ?After squaring off the end-plates to get a true AP view, the C-arm was positioned so that an oblique view of the foramen as noted above was visualized. The target area is just inferior to the "nose of the scotty dog" or sub pedicular. The soft tissues overlying this structure were infiltrated with 2-3 ml. of 1% Lidocaine without Epinephrine. ? ?The spinal needle was inserted toward the target using a "trajectory" view along the fluoroscope beam.  Under AP and lateral visualization, the needle was advanced so it did not puncture dura and was located close the 6 O'Clock position of the pedical in AP tracterory. Biplanar projections were used to confirm position. Aspiration was confirmed to be negative for CSF and/or blood. A 1-2 ml. volume of Isovue-250 was injected and flow of contrast was noted at each level. Radiographs were obtained for documentation purposes.   ? ?After attaining the desired flow of contrast documented above, a 0.5 to 1.0 ml test dose of 0.25% Marcaine was injected into each respective transforaminal space.  The patient was observed for 90 seconds post injection.  After no sensory deficits were reported, and normal lower extremity motor function was noted,   the above injectate was administered so that equal amounts of the injectate were placed at each foramen (level) into the transforaminal epidural space. ? ? ?Additional Comments:  ?The patient tolerated the procedure well ?Dressing: 2 x 2 sterile gauze and Band-Aid ?  ? ?Post-procedure details: ?Patient was observed during the procedure. ?Post-procedure instructions were reviewed. ? ?Patient left the clinic in stable condition. ?  ?

## 2021-12-07 NOTE — Patient Instructions (Signed)

## 2021-12-08 ENCOUNTER — Ambulatory Visit (INDEPENDENT_AMBULATORY_CARE_PROVIDER_SITE_OTHER): Payer: BC Managed Care – PPO | Admitting: Rehabilitative and Restorative Service Providers"

## 2021-12-08 ENCOUNTER — Encounter: Payer: Self-pay | Admitting: Rehabilitative and Restorative Service Providers"

## 2021-12-08 DIAGNOSIS — M25532 Pain in left wrist: Secondary | ICD-10-CM

## 2021-12-08 DIAGNOSIS — R202 Paresthesia of skin: Secondary | ICD-10-CM | POA: Diagnosis not present

## 2021-12-08 DIAGNOSIS — M6281 Muscle weakness (generalized): Secondary | ICD-10-CM

## 2021-12-08 DIAGNOSIS — M542 Cervicalgia: Secondary | ICD-10-CM

## 2021-12-08 NOTE — Therapy (Signed)
?OUTPATIENT OCCUPATIONAL THERAPY TREATMENT NOTE ? ? ?Patient Name: Lindsey Graham ?MRN: [id number] ?DOB:25-May-1976, 46 y.o., female ?Today's Date: 12/08/2021 ? ?PCP: Eunice Blase, MD ?REFERRING PROVIDER: Jessy Oto, MD ? ? OT End of Session - 12/08/21 1100   ? ? Visit Number 10   ? Number of Visits 12   ? Date for OT Re-Evaluation 12/16/21   ? OT Start Time 1100   ? OT Stop Time 1147   ? OT Time Calculation (min) 47 min   ? Activity Tolerance Patient tolerated treatment well;No increased pain;Patient limited by fatigue   ? Behavior During Therapy [facility] for tasks assessed/performed   ? ?  ?  ? ?  ? ? ? ? ? ? ? ? ? ? ?Past Medical History:  ?Diagnosis Date  ? Anxiety   ? Concussion   ? Dyspnea   ? with COVID  ? Migraines   ? PONV (postoperative nausea and vomiting)   ? Seasonal allergies   ? ?Past Surgical History:  ?Procedure Laterality Date  ? ANTERIOR CERVICAL DECOMP/DISCECTOMY FUSION N/A 08/09/2021  ? Procedure: ANTERIOR CERVICAL DISCECTOMY FUSION C4-5 AND C5-6 WITH PLATE, SCREWS, ALLOGRAFT, LOCAL BONE GRAFT, VIVIGEN;  Surgeon: Jessy Oto, MD;  Location: [facility];  Service: Orthopedics;  Laterality: N/A;  ? EYELID LACERATION REPAIR Right   ? Stye removal  ? ?Patient Active Problem List  ? Diagnosis Date Noted  ? Other spondylosis with radiculopathy, cervical region   ? S/P cervical spinal fusion 08/09/2021  ? Herniation of cervical intervertebral disc with radiculopathy 08/09/2021  ? Chronic low back pain 03/26/2020  ? Neck pain 03/26/2020  ? Other chronic pain 03/26/2020  ? Nodule of upper lobe of right lung 10/08/2019  ? Head injury 06/04/2019  ? Post concussion syndrome 06/04/2019  ? Chronic migraine 06/04/2019  ? Back pain 06/04/2019  ? ? ?ONSET DATE: DOS 08/09/21 ?  ?REFERRING DIAG:  ?Z98.1 (ICD-10-CM) - S/P cervical spinal fusion  ?M25.532 (ICD-10-CM) - Pain in left wrist  ? ? ?THERAPY DIAG:  ?Pain in left wrist ? ?Cervicalgia ? ?Paresthesia of skin ? ?Muscle weakness (generalized) ? ? ?PERTINENT HISTORY: Per  MD: "Left wrist pain dorsal radial likely this is mild chronic sprain, she is post 2 level CDF C4-6 4 months ago. Has fusion by radiographs start cervical spine ROM, McKenzie exercises, H, M and left UE ROM and strengthening" ? ?PRECAUTIONS: now 4 months since cervical sx ? ?SUBJECTIVE:  ? She states nothing new, still tight and some radiating nerve pain. She did get injection yesterday to help with LE sciatica issues, and states that is helping.  ? ?PAIN:  ?Are you having pain? Yes  ?Rating: 4.5/10 today Lt side of neck (& lesser in Rt side) ? ? ?OBJECTIVE: (All objective assessments below are from initial evaluation on: 11/07/21 unless otherwise specified.)  ? ? ?HAND DOMINANCE: Right ?  ?ADLs: ?Overall ADLs: Has been asking for help carrying things, opening containers, has had pain when pushing with left hand/wrist, also difficulty with IADL work tasks, etc.  ?  ?FUNCTIONAL OUTCOME MEASURES: ?Quick Dash: 75% impairment today 11/07/21 ?  ?UE ROM: ?  ?Cervical ROM today 11/07/21: L Rot: 30*, R Rot: 32*, Flexion: 12*, Ext: 10* ?11/22/21: L Rot: 29*, R Rot: 27*, Flexion: 28*, Ext: 19* ?  ?Active ROM Left ?11/07/2021 Left ?11/22/21 Left  ?12/01/21 Right  ?12/01/21  ?Shoulder flexion    157* 157*  ?Shoulder abduction    161* 184*  ?Shoulder adduction       ?  Shoulder extension    47* 61*  ?Shoulder internal rotation    44* 53*  ?Shoulder external rotation    90* 100*  ?Elbow flexion WNL     ?Elbow extension WNL     ?Wrist flexion 55 63 63   ?Wrist extension 67 70 70   ?(Blank rows = not tested) ?  ?UE MMT:  NT at eval but at least 4-/5 MMT though b/l UEs based on observations/other tests ?  ?  ?HAND FUNCTION: ?Grip strength: Right: 65 lbs; Left: 60 pain lbs and Lateral pinch: Right: 13 lbs, Left: 10 painful lbs ?  ?COORDINATION: no overt issues, can oppose thumb to all fingers, make full fist, but may need measured due to nerve c/o's  ?9 Hole Peg test: Right: TBD sec; Left: TBD sec ?  ?SENSATION: ?Light touch: WFL ?WFL in hands  now, may need monofilament testing, etc., if symptoms extend into fingers. She does state sensory disturbance roughly through radial nerve distribution in left side of neck, shoulder, arm. ?  ?EDEMA: none significant ?  ?  ?OBSERVATIONS: Neck overtly stiff, TTP in distal radial ulnar joint, but not TFCC, not in thumb CMC, she states tightness in webspace,  negative scaphoid shift test.  ?  ?  ?TODAY'S TREATMENT:  ?12/08/21: She states US had positive effect, so we will continue and do b/l today on lev scap/upper trap trigger points - 100% continuous, 1MHz, 0.7 w/cm^2, [place] 7 mins per side, followed by a few mins manual trigger point release.  She is then guided through b/u UE shoulder motion in conjunction with head/neck AROM in reflexive patterns. She reviews and performs shoulder stretches b/l for posterior capsule, IR, also scap isometric squeezes 3x 5 seconds. Then she uses UBE for 3.5 mins @ 45 RPM and 4.0 resistance before doing light PRE as below for core, upper back, arms, shoulders to put light tension though core/neck as tolerated and start rehabilitation to strengthen and increase endurance to tolerate ability and work tasks. She does well, no pain or c/o at end.  ? ?Tricep pushes: 10# x10 ?Biceps curls b/l: 5# x10 ?Chest Presses: 5# x15 ?Back Rows: 5# x15 ? ? ?12/05/21: OT starts with 7 mins continuous [place] 0.5 w/cm^2 to upper trap/lev scap on Lt side, does trigger point manual release technique (still large, palpable knot is felt today)- she states being open to try dry needling now, if MD permits with a PT. OT then guides her through complete HEP from head/neck to shoulder stretches to motion/nerve glides, etc. Also tolerates prone rowing "I" position and UBE 4.0 resistance for 40RPM for 3 mins total today. She feels no extra pain or tingling at end of session. She is encouraged to go and be very active for the rest of the day as tolerated.  ? ?- Standing Cervical Rotation AROM  - 3-4 x daily - 7 x weekly -  1 sets - 5 reps - 5-10seconds hold ?- Standing Cervical Flexion AROM  - 3-4 x daily - 7 x weekly - 1 sets - 5 reps - 5-10 seconds hold ?- Standing Cervical Sidebending AROM  - 3-4 x daily - 7 x weekly - 1 sets - 5 reps - 5-10second hold ?- Standing Cervical Extension AROM  - 3-4 x daily - 7 x weekly - 1 sets - 5 reps - 5-10second hold ?- Supine Shoulder Flexion with Dowel  - 2-3 x daily - 1 sets - 3-5 reps - 15 sec hold ?Materials engineer  -  2-3 x daily - 3-5 reps - 15 hold ?- Standing Shoulder Abduction Slides at Wall  - 2-3 x daily - 1 sets - 3-5 reps - 15 sec hold ?- Doorway Stretches (both arms low, lean in gently)   - 2-3 x daily - 3-5 reps - 15 hold ?- Standing Shoulder Posterior Capsule Stretch  - 2-3 x daily - 3-5 reps - 15 hold ?- Standing Shoulder Internal Rotation Stretch Behind Back  - 2-3 x daily - 3-5 reps - 15 sec hold ?- Prone Scapular Retraction Arms at Side  - 2-4 x daily - 1-2 sets - 10-15 reps - 1-2 sec hold ?- Ulnar Nerve Flossing  - 3-4 x daily - 1 sets - 5-10 reps ?- Standing Radial Nerve Glide  - 3-4 x daily - 1 sets - 5-10 reps ? ?  ?PATIENT EDUCATION: ?Education details: see tx section above for details  ?Person educated: Patient ?Education method: Explanation, Demonstration, and Handouts ?Education comprehension: verbalized understanding, returned demonstration, and needs further education ?  ?  ?HOME EXERCISE PROGRAM: ?Access Code: [email or web address] ?URL: [email or web address] ?Prepared by: Benito Mccreedy ?  ?  ?GOALS: ?Goals reviewed with patient? Yes ?  ?SHORT TERM GOALS: (STG required if POC>30 days) ?  ?Pt will demo/state understanding of initial HEP to improve pain levels and prerequisite motion. ?Target date: 11/17/21 ?Goal status: MET 11/17/21 ?  ?  ?LONG TERM GOALS: ?  ?Pt will improve functional ability by decreased impairment per Quick DASH assessment from 75% to 15% or better, for better quality of life. ?Target date: 12/16/21 ?Goal status: INITIAL ?  ?2.  Pt will improve grip  strength in b/l grip from 65Rt/60Lt lbs to at least 75Rt/70Lt lbs for functional use in work/factory IADLs. ?Target date: 12/16/21 ?Goal status: INITIAL ?  ?3.  Pt will improve A/ROM in cervical flexi

## 2021-12-12 ENCOUNTER — Encounter: Payer: Self-pay | Admitting: Internal Medicine

## 2021-12-12 ENCOUNTER — Encounter: Payer: BC Managed Care – PPO | Admitting: Rehabilitative and Restorative Service Providers"

## 2021-12-12 ENCOUNTER — Ambulatory Visit (AMBULATORY_SURGERY_CENTER): Payer: BC Managed Care – PPO | Admitting: Internal Medicine

## 2021-12-12 VITALS — BP 104/74 | HR 68 | Temp 97.3°F | Resp 15 | Ht 66.0 in | Wt 173.0 lb

## 2021-12-12 DIAGNOSIS — R1030 Lower abdominal pain, unspecified: Secondary | ICD-10-CM

## 2021-12-12 DIAGNOSIS — Z1211 Encounter for screening for malignant neoplasm of colon: Secondary | ICD-10-CM | POA: Diagnosis present

## 2021-12-12 DIAGNOSIS — K5900 Constipation, unspecified: Secondary | ICD-10-CM

## 2021-12-12 DIAGNOSIS — K625 Hemorrhage of anus and rectum: Secondary | ICD-10-CM

## 2021-12-12 MED ORDER — SODIUM CHLORIDE 0.9 % IV SOLN: 500.0000 mL | Freq: Once | INTRAVENOUS | Status: DC

## 2021-12-12 MED ORDER — HYDROCORTISONE ACETATE 25 MG RE SUPP: 25.0000 mg | Freq: Two times a day (BID) | RECTAL | 0 refills | Status: DC

## 2021-12-12 NOTE — Progress Notes (Signed)
Pt's states no medical or surgical changes since previsit or office visit. VS assessed by D.T 

## 2021-12-12 NOTE — Progress Notes (Signed)
Report to pacu rn; vss. ?

## 2021-12-12 NOTE — Patient Instructions (Signed)
Please read handouts provided. ?Anusol HC suppository twice daily rectally for 7 days. ?Return to GI clinic in 1 month. ?Repeat colonoscopy in 10 years for screening. ? ? ?YOU HAD AN ENDOSCOPIC PROCEDURE TODAY AT THE [facility]:   Refer to the procedure report that was given to you for any specific questions about what was found during the examination.  If the procedure report does not answer your questions, please call your gastroenterologist to clarify.  If you requested that your care partner not be given the details of your procedure findings, then the procedure report has been included in a sealed envelope for you to review at your convenience later. ? ?YOU SHOULD EXPECT: Some feelings of bloating in the abdomen. Passage of more gas than usual.  Walking can help get rid of the air that was put into your GI tract during the procedure and reduce the bloating. If you had a lower endoscopy (such as a colonoscopy or flexible sigmoidoscopy) you may notice spotting of blood in your stool or on the toilet paper. If you underwent a bowel prep for your procedure, you may not have a normal bowel movement for a few days. ? ?Please Note:  You might notice some irritation and congestion in your nose or some drainage.  This is from the oxygen used during your procedure.  There is no need for concern and it should clear up in a day or so. ? ?SYMPTOMS TO REPORT IMMEDIATELY: ? ?Following lower endoscopy (colonoscopy or flexible sigmoidoscopy): ? Excessive amounts of blood in the stool ? Significant tenderness or worsening of abdominal pains ? Swelling of the abdomen that is new, acute ? Fever of 100?F or higher ? ? ?For urgent or emergent issues, a gastroenterologist can be reached at any hour by calling [phone]. ?Do not use MyChart messaging for urgent concerns.  ? ? ?DIET:  We do recommend a small meal at first, but then you may proceed to your regular diet.  Drink plenty of fluids but you should avoid  alcoholic beverages for 24 hours. ? ?ACTIVITY:  You should plan to take it easy for the rest of today and you should NOT DRIVE or use heavy machinery until tomorrow (because of the sedation medicines used during the test).   ? ?FOLLOW UP: ?Our staff will call the number listed on your records 48-72 hours following your procedure to check on you and address any questions or concerns that you may have regarding the information given to you following your procedure. If we do not reach you, we will leave a message.  We will attempt to reach you two times.  During this call, we will ask if you have developed any symptoms of COVID 19. If you develop any symptoms (ie: fever, flu-like symptoms, shortness of breath, cough etc.) before then, please call [phone].  If you test positive for Covid 19 in the 2 weeks post procedure, please call and report this information to [place].   ? ?If any biopsies were taken you will be contacted by phone or by letter within the next 1-3 weeks.  Please call us at [phone] if you have not heard about the biopsies in 3 weeks.  ? ? ?SIGNATURES/CONFIDENTIALITY: ?You and/or your care partner have signed paperwork which will be entered into your electronic medical record.  These signatures attest to the fact that that the information above on your After Visit Summary has been reviewed and is understood.  Full responsibility of the confidentiality  of this discharge information lies with you and/or your care-partner.  ?

## 2021-12-12 NOTE — Progress Notes (Signed)
? ?GASTROENTEROLOGY PROCEDURE H&P NOTE  ? ?Primary Care Physician: ?Eunice Blase, MD ? ? ? ?Reason for Procedure:   Colon cancer screening, rectal bleeding ? ?Plan:    Colonoscopy ? ?Patient is appropriate for endoscopic procedure(s) in the ambulatory ([place]) setting. ? ?The nature of the procedure, as well as the risks, benefits, and alternatives were carefully and thoroughly reviewed with the patient. Ample time for discussion and questions allowed. The patient understood, was satisfied, and agreed to proceed.  ? ? ? ?HPI: ?Lindsey Graham is a 46 y.o. female who presents for colonoscopy for evaluation of rectal bleeding and colon cancer screening.  Patient was most recently seen in the Gastroenterology Clinic on 11/16/21.  No interval change in medical history since that appointment. Please refer to that note for full details regarding GI history and clinical presentation.  ? ?Past Medical History:  ?Diagnosis Date  ? Anxiety   ? Concussion   ? Dyspnea   ? with COVID  ? Migraines   ? PONV (postoperative nausea and vomiting)   ? Seasonal allergies   ? ? ?Past Surgical History:  ?Procedure Laterality Date  ? ANTERIOR CERVICAL DECOMP/DISCECTOMY FUSION N/A 08/09/2021  ? Procedure: ANTERIOR CERVICAL DISCECTOMY FUSION C4-5 AND C5-6 WITH PLATE, SCREWS, ALLOGRAFT, LOCAL BONE GRAFT, VIVIGEN;  Surgeon: Jessy Oto, MD;  Location: [facility];  Service: Orthopedics;  Laterality: N/A;  ? EYELID LACERATION REPAIR Right   ? Stye removal  ? ? ?Prior to Admission medications   ?Medication Sig Start Date End Date Taking? Authorizing Provider  ?Cholecalciferol (VITAMIN D3) 250 MCG (10000 UT) TABS Take 10,000 Units by mouth daily.   Yes [provider]  ?linaclotide Rolan Lipa) 145 MCG CAPS capsule Take 1 capsule (145 mcg total) by mouth daily before breakfast. 11/16/21  Yes Sharyn Creamer, MD  ?ondansetron (ZOFRAN) 8 MG tablet Take 1 tablet (8 mg total) by mouth in the morning, at noon, and at bedtime. 11/16/21  Yes Sharyn Creamer, MD   ?sertraline (ZOLOFT) 50 MG tablet TAKE 1 AND 1/2 TABLETS DAILY BY MOUTH 09/29/20  Yes Hilts, Legrand Como, MD  ?loratadine (CLARITIN) 10 MG tablet Take 10 mg by mouth daily as needed for allergies. ?Patient not taking: Reported on 12/12/2021    [provider]  ?Naproxen Sodium 375 MG TB24 TAKE 1 TABLET (375 MG TOTAL) BY MOUTH DAILY WITH BREAKFAST. ?Patient not taking: Reported on 12/12/2021 11/04/21   Jessy Oto, MD  ?rizatriptan (MAXALT) 10 MG tablet Take 1 tablet (10 mg total) by mouth as needed. ?Patient not taking: Reported on 12/12/2021 11/02/21   Debbora Presto, NP  ? ? ?Current Outpatient Medications  ?Medication Sig Dispense Refill  ? Cholecalciferol (VITAMIN D3) 250 MCG (10000 UT) TABS Take 10,000 Units by mouth daily.    ? linaclotide (LINZESS) 145 MCG CAPS capsule Take 1 capsule (145 mcg total) by mouth daily before breakfast. 30 capsule 2  ? ondansetron (ZOFRAN) 8 MG tablet Take 1 tablet (8 mg total) by mouth in the morning, at noon, and at bedtime. 30 tablet 2  ? sertraline (ZOLOFT) 50 MG tablet TAKE 1 AND 1/2 TABLETS DAILY BY MOUTH 135 tablet 4  ? loratadine (CLARITIN) 10 MG tablet Take 10 mg by mouth daily as needed for allergies. (Patient not taking: Reported on 12/12/2021)    ? Naproxen Sodium 375 MG TB24 TAKE 1 TABLET (375 MG TOTAL) BY MOUTH DAILY WITH BREAKFAST. (Patient not taking: Reported on 12/12/2021) 30 tablet 3  ? rizatriptan (MAXALT) 10 MG  tablet Take 1 tablet (10 mg total) by mouth as needed. (Patient not taking: Reported on 12/12/2021) 9 tablet 11  ? ?Current Facility-Administered Medications  ?Medication Dose Route Frequency Provider Last Rate Last Admin  ? 0.9 %  sodium chloride infusion  500 mL Intravenous Once Sharyn Creamer, MD      ? ? ?Allergies as of 12/12/2021 - Review Complete 12/12/2021  ?Allergen Reaction Noted  ? Codeine Nausea Only 11/16/2021  ? ? ?Family History  ?Problem Relation Age of Onset  ? Crohn's disease Mother   ? COPD Father   ? Hypertension Father   ? Colon cancer  Maternal Grandmother   ? Heart attack Paternal Grandfather   ? Colon cancer Maternal Uncle   ? Colon polyps Maternal Uncle   ? Diabetes Maternal Uncle   ? Hernia Maternal Uncle   ?     hernia attached to intestines  ? Other Son   ?     pancreas not breaking down food properly  ? Brain cancer Paternal Uncle   ? Heart attack Paternal Uncle   ?     x 2  ? ? ?Social History  ? ?Socioeconomic History  ? Marital status: Married  ?  Spouse name: Not on file  ? Number of children: 1  ? Years of education: Not on file  ? Highest education level: Not on file  ?Occupational History  ? Occupation: electronic assembly  ?  Employer: [employer]  ?Tobacco Use  ? Smoking status: Former  ?  Types: E-cigarettes  ?  Quit date: 08/2019  ?  Years since quitting: 2.3  ? Smokeless tobacco: Never  ?Vaping Use  ? Vaping Use: Former  ?Substance and Sexual Activity  ? Alcohol use: Never  ? Drug use: Never  ? Sexual activity: Yes  ?Other Topics Concern  ? Not on file  ?Social History Narrative  ? Not on file  ? ?Social Determinants of Health  ? ?Financial Resource Strain: Not on file  ?Food Insecurity: Not on file  ?Transportation Needs: Not on file  ?Physical Activity: Not on file  ?Stress: Not on file  ?Social Connections: Not on file  ?Intimate Partner Violence: Not on file  ? ? ?Physical Exam: ?Vital signs in last 24 hours: ?BP 114/80   Pulse 81   Temp (!) 97.3 ?F (36.3 ?C) (Skin)   Ht 5\' 6"  (1.676 m)   Wt 173 lb (78.5 kg)   SpO2 98%   BMI 27.92 kg/m?  ?GEN: NAD ?EYE: Sclerae anicteric ?ENT: MMM ?CV: Non-tachycardic ?Pulm: No increased WOB ?GI: Soft ?NEURO:  Alert & Oriented ? ? ?[name], MD ?[facility] ? ? ?12/12/2021 2:45 PM ? ?

## 2021-12-12 NOTE — Op Note (Signed)
[facility] ?Patient Name: Lindsey Graham ?Procedure Date: 12/12/2021 2:52 PM ?MRN: [id number] ?Endoscopist: Sonny Masters "Lindsey Graham ,  ?Age: 46 ?Referring MD:  ?Date of Birth: 1976/04/25 ?Gender: Female ?Account #: [id number] ?Procedure:                Colonoscopy ?Indications:              Screening for colorectal malignant neoplasm, This  ?                          is the patient's first colonoscopy ?Medicines:                Monitored Anesthesia Care ?Procedure:                Pre-Anesthesia Assessment: ?                          - Prior to the procedure, a History and Physical  ?                          was performed, and patient medications and  ?                          allergies were reviewed. The patient's tolerance of  ?                          previous anesthesia was also reviewed. The risks  ?                          and benefits of the procedure and the sedation  ?                          options and risks were discussed with the patient.  ?                          All questions were answered, and informed consent  ?                          was obtained. Prior Anticoagulants: The patient has  ?                          taken no previous anticoagulant or antiplatelet  ?                          agents. ASA Grade Assessment: II - A patient with  ?                          mild systemic disease. After reviewing the risks  ?                          and benefits, the patient was deemed in  ?                          satisfactory condition to undergo the procedure. ?  After obtaining informed consent, the colonoscope  ?                          was passed under direct vision. Throughout the  ?                          procedure, the patient's blood pressure, pulse, and  ?                          oxygen saturations were monitored continuously. The  ?                          CF [id number] was introduced through the anus  ?                          and advanced to the the  terminal ileum. The  ?                          colonoscopy was performed without difficulty. The  ?                          patient tolerated the procedure well. The quality  ?                          of the bowel preparation was good. The terminal  ?                          ileum, ileocecal valve, appendiceal orifice, and  ?                          rectum were photographed. ?Scope In: 2:59:11 PM ?Scope Out: 3:15:43 PM ?Scope Withdrawal Time: 0 hours 10 minutes 25 seconds  ?Total Procedure Duration: 0 hours 16 minutes 32 seconds  ?Findings:                 The terminal ileum appeared normal. ?                          Non-bleeding internal hemorrhoids were found during  ?                          retroflexion. ?Complications:            No immediate complications. ?Estimated Blood Loss:     Estimated blood loss was minimal. ?Impression:               - The examined portion of the ileum was normal. ?                          - Non-bleeding internal hemorrhoids. ?                          - No specimens collected. ?Recommendation:           - Discharge patient to home (with escort). ?                          -  It is suspected that your rectal bleeding is due  ?                          to hemorrhoids. ?                          - Anusol HC BID for 7 days. ?                          - Repeat colonoscopy in 10 years for screening  ?                          purposes. ?                          - The findings and recommendations were discussed  ?                          with the patient. ?                          - Return to GI clinic in 1 month. ?Lindsey Graham,  ?12/12/2021 3:21:27 PM ?

## 2021-12-14 ENCOUNTER — Telehealth: Payer: Self-pay

## 2021-12-14 NOTE — Telephone Encounter (Signed)
?  Follow up Call- ? ? ?  12/12/2021  ?  2:30 PM  ?Call back number  ?Post procedure Call Back phone  # [phone]  ?Permission to leave phone message Yes  ?  ? ? ?Post op call attempted, no answer, left WM.  ?

## 2021-12-14 NOTE — Telephone Encounter (Signed)
?  Follow up Call- ? ? ?  12/12/2021  ?  2:30 PM  ?Call back number  ?Post procedure Call Back phone  # [phone]  ?Permission to leave phone message Yes  ?  ? ?Patient questions: ? ?Do you have a fever, pain , or abdominal swelling? No. ?Pain Score  0 * ? ?Have you tolerated food without any problems? Yes.   ? ?Have you been able to return to your normal activities? Yes.   ? ?Do you have any questions about your discharge instructions: ?Diet   No. ?Medications  No. ?Follow up visit  No. ? ?Do you have questions or concerns about your Care? No. ? ?Actions: ?* If pain score is 4 or above: ?No action needed, pain <4. ? ? ?

## 2021-12-15 ENCOUNTER — Encounter: Payer: Self-pay | Admitting: Specialist

## 2021-12-15 ENCOUNTER — Ambulatory Visit (INDEPENDENT_AMBULATORY_CARE_PROVIDER_SITE_OTHER): Payer: BC Managed Care – PPO | Admitting: Specialist

## 2021-12-15 VITALS — BP 112/77 | HR 88 | Ht 66.0 in | Wt 173.0 lb

## 2021-12-15 DIAGNOSIS — Z981 Arthrodesis status: Secondary | ICD-10-CM | POA: Diagnosis not present

## 2021-12-15 DIAGNOSIS — M48061 Spinal stenosis, lumbar region without neurogenic claudication: Secondary | ICD-10-CM

## 2021-12-15 DIAGNOSIS — M25532 Pain in left wrist: Secondary | ICD-10-CM

## 2021-12-15 DIAGNOSIS — M5416 Radiculopathy, lumbar region: Secondary | ICD-10-CM | POA: Diagnosis not present

## 2021-12-15 MED ORDER — SERTRALINE HCL 50 MG PO TABS: ORAL_TABLET | ORAL | 4 refills | Status: DC

## 2021-12-15 NOTE — Patient Instructions (Signed)
Plan: Avoid overhead lifting and overhead use of the arms. ?Do not lift greater than 10-15 lbs. ?Adjust head rest in vehicle to prevent hyperextension if rear ended. ?Take extra precautions to avoid falling. ?Avoid frequent bending and stooping  ?No lifting greater than 10 lbs. ?May use ice or moist heat for pain. ?Weight loss is of benefit. ?Best medication for lumbar disc disease is arthritis medications like motrin, celebrex and naprosyn. ?Exercise is important to improve your indurance and does allow people to function better inspite of back pain. ?  ?

## 2021-12-15 NOTE — Progress Notes (Signed)
? ?Office Visit Note ?  ?Patient: Lindsey Graham           ?Date of Birth: 07-Aug-1976           ?MRN: [id number] ?Visit Date: 12/15/2021 ?             ?Requested by: Lavada Mesi, MD ?[address] ?[address] ?[place],  [place] [address] ?PCP: Lavada Mesi, MD ? ? ?Assessment & Plan: ?Visit Diagnoses:  ?1. Lumbar radiculopathy   ?2. S/P cervical spinal fusion   ?3. Bilateral stenosis of lateral recess of lumbar spine   ?4. Pain in left wrist   ? ? ?Plan: Plan: Avoid overhead lifting and overhead use of the arms. ?Do not lift greater than 10-15 lbs. ?Adjust head rest in vehicle to prevent hyperextension if rear ended. ?Take extra precautions to avoid falling. ?Avoid frequent bending and stooping  ?No lifting greater than 10 lbs. ?May use ice or moist heat for pain. ?Weight loss is of benefit. ?Best medication for lumbar disc disease is arthritis medications like motrin, celebrex and naprosyn. ?Exercise is important to improve your indurance and does allow people to function better inspite of back pain. ? ?Follow-Up Instructions: Return in about 4 weeks (around 01/12/2022).  ? ?Orders:  ?No orders of the defined types were placed in this encounter. ? ?No orders of the defined types were placed in this encounter. ? ? ? ? Procedures: ?No procedures performed ? ? ?Clinical Data: ?No additional findings. ? ? ?Subjective: ?Chief Complaint  ?Patient presents with  ? Neck - Pain  ? Lower Back - Pain  ? ? ?HPI ? ?Review of Systems ? ? ?Objective: ?Vital Signs: BP 112/77   Pulse 88   Ht 5\' 6"  (1.676 m)   Wt 173 lb (78.5 kg)   BMI 27.92 kg/m?  ? ?Physical Exam ? ?Ortho Exam ? ?Specialty Comments:  ?MRI LUMBAR SPINE WITHOUT CONTRAST ?  ?TECHNIQUE: ?Multiplanar, multisequence MR imaging of the lumbar spine was ?performed. No intravenous contrast was administered. ?  ?COMPARISON:  Lumbar MRI 10/27/2019 ?  ?FINDINGS: ?Segmentation: S1 is a fully lumbarized vertebral body. There is a ?normal disc space at S1-2. Numbering consistent  with the prior MRI ?report. ?  ?Alignment:  Mild retrolisthesis L3-4, L4-5, L5-S1. ?  ?Vertebrae:  Normal bone marrow.  Negative for fracture or mass. ?  ?Conus medullaris and cauda equina: Conus extends to the L1-2 level. ?Conus and cauda equina appear normal. ?  ?Paraspinal and other soft tissues: Negative for paraspinous mass or ?adenopathy. ?  ?Disc levels: ?  ?L1-2: Negative ?  ?L2-3: Negative ?  ?L3-4: Negative ?  ?L4-5: Mild disc bulging with small central disc protrusion unchanged ?from prior. Mild facet hypertrophy. Negative for spinal or foraminal ?stenosis. ?  ?L5-S1: Shallow broad-based disc protrusion. Central annular fissure ?with small central disc protrusion unchanged. Bilateral facet and ?ligamentum flavum hypertrophy. Mild spinal stenosis and moderate ?subarticular stenosis bilaterally. No interval change. ?  ?S1-2: Negative ?  ?IMPRESSION: ?S1 is a fully lumbarized vertebra. ?  ?Disc bulging and small central disc protrusion L4-5 unchanged ?  ?Shallow central disc protrusion L5-S1 with bilateral facet ?hypertrophy. Mild spinal stenosis and moderate subarticular stenosis ?bilaterally, without interval change. ?  ?  ?Electronically Signed ?  By: 10/29/2019 M.D. ?  On: 07/02/2020 15:01 ? ?Imaging: ?No results found. ? ? ?PMFS History: ?Patient Active Problem List  ? Diagnosis Date Noted  ? Herniation of cervical intervertebral disc with radiculopathy 08/09/2021  ?  Priority: High  ? Other spondylosis with radiculopathy, cervical region   ? S/P cervical spinal fusion 08/09/2021  ? Chronic low back pain 03/26/2020  ? Neck pain 03/26/2020  ? Other chronic pain 03/26/2020  ? Nodule of upper lobe of right lung 10/08/2019  ? Head injury 06/04/2019  ? Post concussion syndrome 06/04/2019  ? Chronic migraine 06/04/2019  ? Back pain 06/04/2019  ? ?Past Medical History:  ?Diagnosis Date  ? Anxiety   ? Concussion   ? Dyspnea   ? with COVID  ? Migraines   ? PONV (postoperative nausea and vomiting)   ?  Seasonal allergies   ?  ?Family History  ?Problem Relation Age of Onset  ? Crohn's disease Mother   ? COPD Father   ? Hypertension Father   ? Colon cancer Maternal Grandmother   ? Heart attack Paternal Grandfather   ? Colon cancer Maternal Uncle   ? Colon polyps Maternal Uncle   ? Diabetes Maternal Uncle   ? Hernia Maternal Uncle   ?     hernia attached to intestines  ? Other Son   ?     pancreas not breaking down food properly  ? Brain cancer Paternal Uncle   ? Heart attack Paternal Uncle   ?     x 2  ?  ?Past Surgical History:  ?Procedure Laterality Date  ? ANTERIOR CERVICAL DECOMP/DISCECTOMY FUSION N/A 08/09/2021  ? Procedure: ANTERIOR CERVICAL DISCECTOMY FUSION C4-5 AND C5-6 WITH PLATE, SCREWS, ALLOGRAFT, LOCAL BONE GRAFT, VIVIGEN;  Surgeon: Kerrin Champagne, MD;  Location: [facility];  Service: Orthopedics;  Laterality: N/A;  ? EYELID LACERATION REPAIR Right   ? Stye removal  ? ?Social History  ? ?Occupational History  ? Occupation: electronic assembly  ?  Employer: [employer]  ?Tobacco Use  ? Smoking status: Former  ?  Types: E-cigarettes  ?  Quit date: 08/2019  ?  Years since quitting: 2.3  ? Smokeless tobacco: Never  ?Vaping Use  ? Vaping Use: Former  ?Substance and Sexual Activity  ? Alcohol use: Never  ? Drug use: Never  ? Sexual activity: Yes  ? ? ? ? ? ? ?

## 2021-12-16 ENCOUNTER — Encounter: Payer: Self-pay | Admitting: Rehabilitative and Restorative Service Providers"

## 2021-12-16 ENCOUNTER — Ambulatory Visit (INDEPENDENT_AMBULATORY_CARE_PROVIDER_SITE_OTHER): Payer: BC Managed Care – PPO | Admitting: Rehabilitative and Restorative Service Providers"

## 2021-12-16 DIAGNOSIS — M25532 Pain in left wrist: Secondary | ICD-10-CM

## 2021-12-16 DIAGNOSIS — M542 Cervicalgia: Secondary | ICD-10-CM

## 2021-12-16 DIAGNOSIS — R202 Paresthesia of skin: Secondary | ICD-10-CM | POA: Diagnosis not present

## 2021-12-16 DIAGNOSIS — M6281 Muscle weakness (generalized): Secondary | ICD-10-CM | POA: Diagnosis not present

## 2021-12-16 NOTE — Therapy (Signed)
?OUTPATIENT OCCUPATIONAL THERAPY TREATMENT & PROGRESS NOTE ? ? ?Patient Name: Lindsey Graham ?MRN: [id number] ?DOB:[birth year], 46 y.o., female ?Today's Date: 12/16/2021 ? ?PCP: Eunice Blase, MD ?REFERRING PROVIDER: Jessy Oto, MD ? ?Progress Note ?Reporting Period 11/07/21 to 12/16/21. ? ?See note below for Objective Data and Assessment of Progress/Goals.  ? ? OT End of Session - 12/16/21 1109   ? ? Visit Number 11   ? Number of Visits 12   ? Date for OT Re-Evaluation 01/27/22   ? OT Start Time 1108   ? OT Stop Time 1154   ? OT Time Calculation (min) 46 min   ? Activity Tolerance Patient tolerated treatment well;No increased pain;Patient limited by fatigue   ? Behavior During Therapy [facility] for tasks assessed/performed   ? ?  ?  ? ?  ? ?  ? ?Past Medical History:  ?Diagnosis Date  ? Anxiety   ? Concussion   ? Dyspnea   ? with COVID  ? Migraines   ? PONV (postoperative nausea and vomiting)   ? Seasonal allergies   ? ?Past Surgical History:  ?Procedure Laterality Date  ? ANTERIOR CERVICAL DECOMP/DISCECTOMY FUSION N/A 08/09/2021  ? Procedure: ANTERIOR CERVICAL DISCECTOMY FUSION C4-5 AND C5-6 WITH PLATE, SCREWS, ALLOGRAFT, LOCAL BONE GRAFT, VIVIGEN;  Surgeon: Jessy Oto, MD;  Location: [facility];  Service: Orthopedics;  Laterality: N/A;  ? EYELID LACERATION REPAIR Right   ? Stye removal  ? ?Patient Active Problem List  ? Diagnosis Date Noted  ? Other spondylosis with radiculopathy, cervical region   ? S/P cervical spinal fusion 08/09/2021  ? Herniation of cervical intervertebral disc with radiculopathy 08/09/2021  ? Chronic low back pain 03/26/2020  ? Neck pain 03/26/2020  ? Other chronic pain 03/26/2020  ? Nodule of upper lobe of right lung 10/08/2019  ? Head injury 06/04/2019  ? Post concussion syndrome 06/04/2019  ? Chronic migraine 06/04/2019  ? Back pain 06/04/2019  ? ? ?ONSET DATE: DOS 08/09/21 ?  ?REFERRING DIAG:  ?Z98.1 (ICD-10-CM) - S/P cervical spinal fusion  ?M25.532 (ICD-10-CM) - Pain in left wrist  ? ? ?THERAPY  DIAG:  ?Pain in left wrist ? ?Cervicalgia ? ?Paresthesia of skin ? ?Muscle weakness (generalized) ? ? ?PERTINENT HISTORY: Per MD: "Left wrist pain dorsal radial likely this is mild chronic sprain, she is post 2 level CDF C4-6 4 months ago. Has fusion by radiographs start cervical spine ROM, McKenzie exercises, H, M and left UE ROM and strengthening" ? ?PRECAUTIONS: now 4+ months since cervical sx ? ?SUBJECTIVE:  ? She states MD appt went well, nothing negative. ? ?PAIN:  ?Are you having pain? Yes  ?Rating: 4/10 today Lt side of neck (& lesser in Rt side) ? ? ?OBJECTIVE: (All objective assessments below are from initial evaluation on: 11/07/21 unless otherwise specified.)  ? ? ?HAND DOMINANCE: Right ?  ?ADLs: ?Overall ADLs: Has been asking for help carrying things, opening containers, has had pain when pushing with left hand/wrist, also difficulty with IADL work tasks, etc.  ?  ?FUNCTIONAL OUTCOME MEASURES: ?Quick Dash: 75% impairment today 11/07/21 ? ?12/16/21: Katina Dung: 41% impairment today ?  ?UE ROM: ?  ?Cervical ROM today 11/07/21: L Rot: 30*, R Rot: 32*, Flexion: 12*, Ext: 10* ?11/22/21: L Rot: 29*, R Rot: 27*, Flexion: 28*, Ext: 19* ? ?12/16/21: head/neck AROM today: L Rot: 42*, R Rot: 40*, Flexion: 22*, Ext: 17* ?  ?Active ROM Left ?11/07/2021 Left ?11/22/21 Left  ?12/01/21 Right  ?12/01/21 Lt  ?12/16/21 Rt ?  12/16/21  ?Shoulder flexion    157* 157* 156 163  ?Shoulder abduction    161* 184* 180 180  ?Shoulder adduction         ?Shoulder extension    47* 61*    ?Shoulder internal rotation    44* 53* 33 37  ?Shoulder external rotation    90* 100* 95 87  ?Elbow flexion WNL       ?Elbow extension WNL       ?Wrist flexion 55 63 63     ?Wrist extension 67 70 70     ?(Blank rows = not tested) ?  ?UE MMT:  NT at eval but at least 4-/5 MMT though b/l UEs based on observations/other tests ?  ?  ?HAND FUNCTION: ?Grip strength: Right: 65 lbs; Left: 60 pain lbs and Lateral pinch: Right: 13 lbs, Left: 10 painful lbs ? ?12/16/21: Grip  strength: Right: 66 lbs; Left: 55  lbs and Lateral pinch: Right: 13 lbs, Left: 12 painful lbs ?  ?COORDINATION: no overt issues, can oppose thumb to all fingers, make full fist, but may need measured due to nerve c/o's  ? ?  ?SENSATION: ?Light touch: WFL ?WFL in hands now, may need monofilament testing, etc., if symptoms extend into fingers. She does state sensory disturbance roughly through radial nerve distribution in left side of neck, shoulder, arm. ?  ?EDEMA: none significant ?  ?  ?OBSERVATIONS: Neck overtly stiff, TTP in distal radial ulnar joint, but not TFCC, not in thumb CMC, she states tightness in webspace,  negative scaphoid shift test.  ? ?12/16/21: Wrist still stable, no significant TTP, still somewhat aching at end-ranges; wrist hasn't been a big priority in therapy, though we have discussed stretches and strength with putty, etc.  ?  ?  ?TODAY'S TREATMENT:  ?12/16/21: She performs AROM of head, neck, shoulders for reassessment measures and there exercises. She also does gripping, pinching and MMT for strengthening and measures. We discuss home functional issues and compensation techniques, but home fnl activities have improved a lot. OT completely reviewed comprehensive HEP for A/A/PROM to head/neck in multiple planes, wrist stretches, nerve glides, hand strength with putty, and doing light functional activities around the house. We also discussed safety for not lifting overhead weight yet, keeping weight training lighter weight and higher rep. She was advised to do thorough HEP, especially every morning to loosen up throughout the day. She states understanding and that she is trying.  ? ? ?12/08/21: She states US had positive effect, so we will continue and do b/l today on lev scap/upper trap trigger points - 100% continuous, 1MHz, 0.7 w/cm^2, [place] 7 mins per side, followed by a few mins manual trigger point release.  She is then guided through b/u UE shoulder motion in conjunction with head/neck AROM in  reflexive patterns. She reviews and performs shoulder stretches b/l for posterior capsule, IR, also scap isometric squeezes 3x 5 seconds. Then she uses UBE for 3.5 mins @ 45 RPM and 4.0 resistance before doing light PRE as below for core, upper back, arms, shoulders to put light tension though core/neck as tolerated and start rehabilitation to strengthen and increase endurance to tolerate ability and work tasks. She does well, no pain or c/o at end.  ? ?Tricep pushes: 10# x10 ?Biceps curls b/l: 5# x10 ?Chest Presses: 5# x15 ?Back Rows: 5# x15 ? ?  ?PATIENT EDUCATION: ?Education details: see tx section above for details  ?Person educated: Patient ?Education method: Explanation, Demonstration, and Handouts ?  Education comprehension: verbalized understanding, returned demonstration, and needs further education ?  ?  ?HOME EXERCISE PROGRAM: ?Access Code: [email or web address] ?URL: [email or web address] ?Prepared by: Benito Mccreedy ?  ?  ?GOALS: ?Goals reviewed with patient? Yes ?  ?SHORT TERM GOALS: (STG required if POC>30 days) ?  ?Pt will demo/state understanding of initial HEP to improve pain levels and prerequisite motion. ?Target date: 11/17/21 ?Goal status: MET 11/17/21 ?  ?  ?LONG TERM GOALS: ?  ?Pt will improve functional ability by decreased impairment per Quick DASH assessment from 75% to 15% or better, for better quality of life. ?Target date: 12/16/21 ?Goal status: 12/16/21: 41% now, largely improved ?  ?2.  Pt will improve grip strength in b/l grip from 65Rt/60Lt lbs to at least 75Rt/70Lt lbs for functional use in work/factory IADLs. ?Target date: 12/16/21 ?Goal status: 12/16/21: not much progress here, but hasn't been a priority so far ?  ?3.  Pt will improve A/ROM in cervical flexion & ext from ~10* each to at least 60*/30* respectively, to have functional motion for tasks like driving, dressing, social participation with friends/family.  ?Target date: 12/16/21 ?Goal status: 12/16/21; improved to 22* / 17* today; goal  also downgraded from 70*/40* to 60*/30* due to more likely outcomes t this point.  ?  ?4.  Pt will improve strength in b/l shoulders from 4-/5 MMT to at least 4+/5 MMT (no pain) to have increased functional

## 2021-12-19 ENCOUNTER — Ambulatory Visit: Payer: Self-pay | Admitting: Family Medicine

## 2021-12-23 ENCOUNTER — Encounter: Payer: Self-pay | Admitting: Rehabilitative and Restorative Service Providers"

## 2021-12-23 ENCOUNTER — Ambulatory Visit (INDEPENDENT_AMBULATORY_CARE_PROVIDER_SITE_OTHER): Payer: BC Managed Care – PPO | Admitting: Rehabilitative and Restorative Service Providers"

## 2021-12-23 DIAGNOSIS — M25532 Pain in left wrist: Secondary | ICD-10-CM | POA: Diagnosis not present

## 2021-12-23 DIAGNOSIS — M542 Cervicalgia: Secondary | ICD-10-CM

## 2021-12-23 DIAGNOSIS — M6281 Muscle weakness (generalized): Secondary | ICD-10-CM

## 2021-12-23 NOTE — Therapy (Signed)
?OUTPATIENT OCCUPATIONAL THERAPY TREATMENT NOTE ? ? ?Patient Name: Lindsey Graham ?MRN: [id number] ?DOB:October 01, 1975, 46 y.o., female ?Today's Date: 12/23/2021 ? ?PCP: Eunice Blase, MD ?REFERRING PROVIDER: Jessy Oto, MD ? ? OT End of Session - 12/23/21 0037   ? ? Visit Number 12   ? Number of Visits 24   ? Date for OT Re-Evaluation 01/27/22   ? OT Start Time 0488   ? OT Stop Time 1015   ? OT Time Calculation (min) 40 min   ? Activity Tolerance Patient tolerated treatment well;No increased pain;Patient limited by fatigue   ? Behavior During Therapy [facility] for tasks assessed/performed   ? ?  ?  ? ?  ? ? ?  ? ?Past Medical History:  ?Diagnosis Date  ? Anxiety   ? Concussion   ? Dyspnea   ? with COVID  ? Migraines   ? PONV (postoperative nausea and vomiting)   ? Seasonal allergies   ? ?Past Surgical History:  ?Procedure Laterality Date  ? ANTERIOR CERVICAL DECOMP/DISCECTOMY FUSION N/A 08/09/2021  ? Procedure: ANTERIOR CERVICAL DISCECTOMY FUSION C4-5 AND C5-6 WITH PLATE, SCREWS, ALLOGRAFT, LOCAL BONE GRAFT, VIVIGEN;  Surgeon: Jessy Oto, MD;  Location: [facility];  Service: Orthopedics;  Laterality: N/A;  ? EYELID LACERATION REPAIR Right   ? Stye removal  ? ?Patient Active Problem List  ? Diagnosis Date Noted  ? Other spondylosis with radiculopathy, cervical region   ? S/P cervical spinal fusion 08/09/2021  ? Herniation of cervical intervertebral disc with radiculopathy 08/09/2021  ? Chronic low back pain 03/26/2020  ? Neck pain 03/26/2020  ? Other chronic pain 03/26/2020  ? Nodule of upper lobe of right lung 10/08/2019  ? Head injury 06/04/2019  ? Post concussion syndrome 06/04/2019  ? Chronic migraine 06/04/2019  ? Back pain 06/04/2019  ? ? ?ONSET DATE: DOS 08/09/21 ?  ?REFERRING DIAG:  ?Z98.1 (ICD-10-CM) - S/P cervical spinal fusion  ?M25.532 (ICD-10-CM) - Pain in left wrist  ? ? ?THERAPY DIAG:  ?Pain in left wrist ? ?Cervicalgia ? ?Muscle weakness (generalized) ? ? ?PERTINENT HISTORY: Per MD: "Left wrist pain dorsal  radial likely this is mild chronic sprain, she is post 2 level CDF C4-6 4 months ago. Has fusion by radiographs start cervical spine ROM, McKenzie exercises, H, M and left UE ROM and strengthening" ? ?PRECAUTIONS: now 4+ months since cervical sx ? ?SUBJECTIVE:  ? She states her week was "typical" and pain goes up and down still, has been doing very light "bowflex" exercises at home, mimicking what we do in sessions.  ? ?PAIN:  ?Are you having pain? Yes  ?Rating: 4.5/10 today Lt side of neck (& lesser in Rt side) ? ? ?OBJECTIVE: (All objective assessments below are from initial evaluation on: 11/07/21 unless otherwise specified.)  ? ? ?HAND DOMINANCE: Right ?  ?ADLs: ?Overall ADLs: Has been asking for help carrying things, opening containers, has had pain when pushing with left hand/wrist, also difficulty with IADL work tasks, etc.  ?  ?FUNCTIONAL OUTCOME MEASURES: ?Quick Dash: 75% impairment today 11/07/21 ? ?12/16/21: Katina Dung: 41% impairment today ?  ?UE ROM: ?  ?Cervical ROM today 11/07/21: L Rot: 30*, R Rot: 32*, Flexion: 12*, Ext: 10* ?11/22/21: L Rot: 29*, R Rot: 27*, Flexion: 28*, Ext: 19* ? ?12/16/21: head/neck AROM today: L Rot: 42*, R Rot: 40*, Flexion: 22*, Ext: 17* ?  ?Active ROM Left ?11/07/2021 Left ?11/22/21 Left  ?12/01/21 Right  ?12/01/21 Lt  ?12/16/21 Rt ?12/16/21  ?Shoulder flexion  157* 157* 156 163  ?Shoulder abduction    161* 184* 180 180  ?Shoulder adduction         ?Shoulder extension    47* 61*    ?Shoulder internal rotation    44* 53* 33 37  ?Shoulder external rotation    90* 100* 95 87  ?Elbow flexion WNL       ?Elbow extension WNL       ?Wrist flexion 55 63 63     ?Wrist extension 67 70 70     ?(Blank rows = not tested) ?  ?UE MMT:  NT at eval but at least 4-/5 MMT though b/l UEs based on observations/other tests ?  ?  ?HAND FUNCTION: ?Grip strength: Right: 65 lbs; Left: 60 pain lbs and Lateral pinch: Right: 13 lbs, Left: 10 painful lbs ? ?12/16/21: Grip strength: Right: 66 lbs; Left: 55  lbs and  Lateral pinch: Right: 13 lbs, Left: 12 painful lbs ?  ?COORDINATION: no overt issues, can oppose thumb to all fingers, make full fist, but may need measured due to nerve c/o's  ? ?  ?SENSATION: ?Light touch: WFL ?WFL in hands now, may need monofilament testing, etc., if symptoms extend into fingers. She does state sensory disturbance roughly through radial nerve distribution in left side of neck, shoulder, arm. ?  ?EDEMA: none significant ?  ?  ?OBSERVATIONS: Neck overtly stiff, TTP in distal radial ulnar joint, but not TFCC, not in thumb CMC, she states tightness in webspace,  negative scaphoid shift test.  ? ?12/16/21: Wrist still stable, no significant TTP, still somewhat aching at end-ranges; wrist hasn't been a big priority in therapy, though we have discussed stretches and strength with putty, etc.  ?  ?  ?TODAY'S TREATMENT:  ?12/23/21: She starts with [place] (b/l today on lev scap/upper trap trigger points - 100% continuous, 1MHz, 0.7 w/cm^2, [place] 6 mins per side), then goes through exercises starting with head/neck motion, shoulder stretches for posterior capsule, light nerve glides, then UBE push/pull activity (4 mins @ 45 RPM and 4.5 resistance) for her functional endurance. OT then edu for head/neck isometric strength in extension, lateral flexion and a review of chink tuck with pressure (flexion isometric). She does well with these, and they are added to HEP to be done at least 3 x day. She states "not feeling any worse" after the session, and "maybe a little better."  ? ? ?12/16/21: She performs AROM of head, neck, shoulders for reassessment measures and there exercises. She also does gripping, pinching and MMT for strengthening and measures. We discuss home functional issues and compensation techniques, but home fnl activities have improved a lot. OT completely reviewed comprehensive HEP for A/A/PROM to head/neck in multiple planes, wrist stretches, nerve glides, hand strength with putty, and doing light  functional activities around the house. We also discussed safety for not lifting overhead weight yet, keeping weight training lighter weight and higher rep. She was advised to do thorough HEP, especially every morning to loosen up throughout the day. She states understanding and that she is trying.  ? ? ?12/08/21: She states US had positive effect, so we will continue and do b/l today on lev scap/upper trap trigger points - 100% continuous, 1MHz, 0.7 w/cm^2, [place] 7 mins per side, followed by a few mins manual trigger point release.  She is then guided through b/u UE shoulder motion in conjunction with head/neck AROM in reflexive patterns. She reviews and performs shoulder stretches b/l for posterior capsule,  IR, also scap isometric squeezes 3x 5 seconds. Then she uses UBE for 3.5 mins @ 45 RPM and 4.0 resistance before doing light PRE as below for core, upper back, arms, shoulders to put light tension though core/neck as tolerated and start rehabilitation to strengthen and increase endurance to tolerate ability and work tasks. She does well, no pain or c/o at end.  ? ?Tricep pushes: 10# x10 ?Biceps curls b/l: 5# x10 ?Chest Presses: 5# x15 ?Back Rows: 5# x15 ? ?  ?PATIENT EDUCATION: ?Education details: see tx section above for details  ?Person educated: Patient ?Education method: Explanation, Demonstration, and Handouts ?Education comprehension: verbalized understanding, returned demonstration, and needs further education ?  ?  ?HOME EXERCISE PROGRAM: ?Access Code: [email or web address] ?URL: [email or web address] ?Prepared by: Benito Mccreedy ?  ?  ?GOALS: ?Goals reviewed with patient? Yes ?  ?SHORT TERM GOALS: (STG required if POC>30 days) ?  ?Pt will demo/state understanding of initial HEP to improve pain levels and prerequisite motion. ?Target date: 11/17/21 ?Goal status: MET 11/17/21 ?  ?  ?LONG TERM GOALS: ?  ?Pt will improve functional ability by decreased impairment per Quick DASH assessment from 75% to 15% or  better, for better quality of life. ?Target date: 12/16/21 ?Goal status: 12/16/21: 41% now, largely improved ?  ?2.  Pt will improve grip strength in b/l grip from 65Rt/60Lt lbs to at least 75Rt/70Lt lbs for functional use in

## 2021-12-27 ENCOUNTER — Encounter: Payer: BC Managed Care – PPO | Admitting: Rehabilitative and Restorative Service Providers"

## 2021-12-29 ENCOUNTER — Encounter: Payer: Self-pay | Admitting: Rehabilitative and Restorative Service Providers"

## 2021-12-29 ENCOUNTER — Ambulatory Visit (INDEPENDENT_AMBULATORY_CARE_PROVIDER_SITE_OTHER): Payer: BC Managed Care – PPO | Admitting: Rehabilitative and Restorative Service Providers"

## 2021-12-29 DIAGNOSIS — M25532 Pain in left wrist: Secondary | ICD-10-CM | POA: Diagnosis not present

## 2021-12-29 DIAGNOSIS — M542 Cervicalgia: Secondary | ICD-10-CM

## 2021-12-29 DIAGNOSIS — R202 Paresthesia of skin: Secondary | ICD-10-CM

## 2021-12-29 DIAGNOSIS — M6281 Muscle weakness (generalized): Secondary | ICD-10-CM | POA: Diagnosis not present

## 2021-12-29 NOTE — Therapy (Signed)
OUTPATIENT OCCUPATIONAL THERAPY TREATMENT NOTE   Patient Name: Lindsey Graham MRN: [id number] DOB:Mar 06, 1976, 46 y.o., female Today's Date: 12/29/2021  PCP: Eunice Blase, MD REFERRING PROVIDER: Jessy Oto, MD   OT End of Session - 12/29/21 1111     Visit Number 13    Number of Visits 24    Date for OT Re-Evaluation 01/27/22    OT Start Time 1110    OT Stop Time 1151    OT Time Calculation (min) 41 min    Activity Tolerance Patient tolerated treatment well;No increased pain;Patient limited by fatigue    Behavior During Therapy [facility] for tasks assessed/performed                 Past Medical History:  Diagnosis Date   Anxiety    Concussion    Dyspnea    with COVID   Migraines    PONV (postoperative nausea and vomiting)    Seasonal allergies    Past Surgical History:  Procedure Laterality Date   ANTERIOR CERVICAL DECOMP/DISCECTOMY FUSION N/A 08/09/2021   Procedure: ANTERIOR CERVICAL DISCECTOMY FUSION C4-5 AND C5-6 WITH PLATE, SCREWS, ALLOGRAFT, LOCAL BONE GRAFT, VIVIGEN;  Surgeon: Jessy Oto, MD;  Location: [place];  Service: Orthopedics;  Laterality: N/A;   EYELID LACERATION REPAIR Right    Stye removal   Patient Active Problem List   Diagnosis Date Noted   Other spondylosis with radiculopathy, cervical region    S/P cervical spinal fusion 08/09/2021   Herniation of cervical intervertebral disc with radiculopathy 08/09/2021   Chronic low back pain 03/26/2020   Neck pain 03/26/2020   Other chronic pain 03/26/2020   Nodule of upper lobe of right lung 10/08/2019   Head injury 06/04/2019   Post concussion syndrome 06/04/2019   Chronic migraine 06/04/2019   Back pain 06/04/2019    ONSET DATE: DOS 08/09/21   REFERRING DIAG:  Z98.1 (ICD-10-CM) - S/P cervical spinal fusion  M25.532 (ICD-10-CM) - Pain in left wrist    THERAPY DIAG:  Pain in left wrist  Cervicalgia  Paresthesia of skin  Muscle weakness (generalized)   PERTINENT HISTORY: Per MD:  "Left wrist pain dorsal radial likely this is mild chronic sprain, she is post 2 level CDF C4-6 4 months ago. Has fusion by radiographs start cervical spine ROM, McKenzie exercises, H, M and left UE ROM and strengthening"  PRECAUTIONS: now 4+ months since cervical sx  SUBJECTIVE:  She states new neck isometrics have her feeling better, less radicular issues down left side of shoulder/neck. Wrist still bothers her at times, though.  She states doing light workouts on her husband's "box flex" as well.    PAIN:  Are you having pain? Yes  Rating: 3.5/10 today Lt side of neck (& lesser in Rt side)   OBJECTIVE: (All objective assessments below are from initial evaluation on: 11/07/21 unless otherwise specified.)    HAND DOMINANCE: Right   ADLs: Overall ADLs: Has been asking for help carrying things, opening containers, has had pain when pushing with left hand/wrist, also difficulty with IADL work tasks, etc.    FUNCTIONAL OUTCOME MEASURES: Quick Dash: 75% impairment today 11/07/21  12/16/21: Mikael Spray Dash: 41% impairment today   UE ROM:   Cervical ROM today 11/07/21: L Rot: 30*, R Rot: 32*, Flexion: 12*, Ext: 10* 11/22/21: L Rot: 29*, R Rot: 27*, Flexion: 28*, Ext: 19*  12/16/21: head/neck AROM today: L Rot: 42*, R Rot: 40*, Flexion: 22*, Ext: 17*   Active ROM Left 11/07/2021 Left 11/22/21  Left  12/01/21 Right  12/01/21 Lt  12/16/21 Rt 12/16/21  Shoulder flexion    157* 157* 156 163  Shoulder abduction    161* 184* 180 180  Shoulder adduction         Shoulder extension    47* 61*    Shoulder internal rotation    44* 53* 33 37  Shoulder external rotation    90* 100* 95 87  Elbow flexion WNL       Elbow extension WNL       Wrist flexion 55 63 63     Wrist extension 67 70 70     (Blank rows = not tested)   UE MMT:  NT at eval but at least 4-/5 MMT though b/l UEs based on observations/other tests     HAND FUNCTION: Grip strength: Right: 65 lbs; Left: 60 pain lbs and Lateral pinch: Right:  13 lbs, Left: 10 painful lbs  12/16/21: Grip strength: Right: 66 lbs; Left: 55  lbs and Lateral pinch: Right: 13 lbs, Left: 12 painful lbs  12/29/21: Lt hand 60# grip today    COORDINATION: no overt issues, can oppose thumb to all fingers, make full fist, but may need measured due to nerve c/o's   SENSATION: Light touch: WFL WFL in hands now, may need monofilament testing, etc., if symptoms extend into fingers. She does state sensory disturbance roughly through radial nerve distribution in left side of neck, shoulder, arm.   EDEMA: none significant     OBSERVATIONS: Eval: Neck overtly stiff, TTP in distal radial ulnar joint, but not TFCC, not in thumb CMC, she states tightness in webspace,  negative scaphoid shift test.   12/16/21: Wrist still stable, no significant TTP, still somewhat aching at end-ranges; wrist hasn't been a big priority in therapy, though we have discussed stretches and strength with putty, etc.      TODAY'S TREATMENT:  12/29/21: OT does [place] with her (b/l today on lev scap/upper trap trigger points - 100% continuous, 1MHz, 0.7 w/cm^2, [place] 6 mins per side), while reviewing HEP and new neck isometrics. She is lead through head/neck AROM before starting neck isometrics for review/performance. She tolerates well, then also performs push/pull strength and is stronger/higher resistance now.  Chest Presses: 10# x15 Back Rows: 10# x15  Then, due to continued wrist tightness and soreness around carpals (left wrist), OT reviews wrist stretches with her, use of t-putty, but also adds isometric dart thrower's motion with red t-band to increase carpal stability. She has no pain with this, is asked to try 2x day at home.    12/23/21: She starts with [place] (b/l today on lev scap/upper trap trigger points - 100% continuous, 1MHz, 0.7 w/cm^2, [place] 6 mins per side), then goes through exercises starting with head/neck motion, shoulder stretches for posterior capsule, light nerve glides, then UBE  push/pull activity (4 mins @ 45 RPM and 4.5 resistance) for her functional endurance. OT then edu for head/neck isometric strength in extension, lateral flexion and a review of chink tuck with pressure (flexion isometric). She does well with these, and they are added to HEP to be done at least 3 x day. She states "not feeling any worse" after the session, and "maybe a little better."    PATIENT EDUCATION: Education details: see tx section above for details  Person educated: Patient Education method: Explanation, Demonstration, and Handouts Education comprehension: verbalized understanding, returned demonstration, and needs further education     HOME EXERCISE PROGRAM: Access Code: [facility]  URL: [email or web address] Prepared by: Benito Mccreedy     GOALS: Goals reviewed with patient? Yes   SHORT TERM GOALS: (STG required if POC>30 days)   Pt will demo/state understanding of initial HEP to improve pain levels and prerequisite motion. Target date: 11/17/21 Goal status: MET 11/17/21     LONG TERM GOALS:   Pt will improve functional ability by decreased impairment per Quick DASH assessment from 75% to 15% or better, for better quality of life. Target date: 12/16/21 Goal status: 12/16/21: 41% now, largely improved   2.  Pt will improve grip strength in b/l grip from 65Rt/60Lt lbs to at least 75Rt/70Lt lbs for functional use in work/factory IADLs. Target date: 12/16/21 Goal status: 12/16/21: not much progress here, but hasn't been a priority so far   3.  Pt will improve A/ROM in cervical flexion & ext from ~10* each to at least 60*/30* respectively, to have functional motion for tasks like driving, dressing, social participation with friends/family.  Target date: 12/16/21 Goal status: 12/16/21; improved to 22* / 17* today; goal also downgraded from 70*/40* to 60*/30* due to more likely outcomes t this point.    4.  Pt will improve strength in b/l shoulders from 4-/5 MMT to at least  4+/5 MMT (no pain) to have increased functional ability to carry out selfcare and higher-level homecare tasks with no difficulty. Target date: 12/16/21 Goal status: 12/16/21: MET 4+/5 now b/l sh scaption, but needs to continue working on endurance at this level now   5.  Pt will decrease pain at worst from "Moderate" to 2/10 or better to have better sleep and occupational participation in daily roles. Target date: 12/16/21 Goal status: 12/16/21: improving now, down from ~6/10 at rest to ~4/10 now, has been consistently improving.   6. Pt will push/pull up to 50# with no problems/pain to return to work activities. Target date: 01/27/22 Goal status: 12/16/21: NEW GOAL       ASSESSMENT:   CLINICAL IMPRESSION: 12/29/21: Highly encouraging that she had less tightness and radicular c/o since starting nck isometric exercises. Keep on.   12/23/21: Pt now doing targeted head/neck strength with isometrics, tolerating well.   12/16/21: She is slowly and steadily improving, we need to focus on neck flex/ext, as well as increase functional tolerance to et back to life and work activities. We will continue 2xweek for another 6 weeks when she will f/u with her doctor.     PLAN: OT FREQUENCY: additional 2x/week   OT DURATION: additional 6 weeks (through 01/27/22)   PLANNED INTERVENTIONS: self care/ADL training, therapeutic exercise, therapeutic activity, manual therapy, scar mobilization, passive range of motion, splinting, ultrasound, fluidotherapy, moist heat, cryotherapy, patient/family education, and coping strategies training   RECOMMENDED OTHER SERVICES: Due to improvement still happening, likely no other referrals needed.    CONSULTED AND AGREED WITH PLAN OF CARE: Patient   PLAN FOR NEXT SESSION:  Continue [place] and manual therapy as she is doing well with HEP for home performance. Review new DTM wrist strength. Advance PRE as tol, try fnl training (box lift, etc.)    Benito Mccreedy, OTR/L,  CHT 12/29/2021, 12:07 PM

## 2022-01-03 ENCOUNTER — Encounter: Payer: Self-pay | Admitting: Rehabilitative and Restorative Service Providers"

## 2022-01-03 ENCOUNTER — Ambulatory Visit (INDEPENDENT_AMBULATORY_CARE_PROVIDER_SITE_OTHER): Payer: BC Managed Care – PPO | Admitting: Rehabilitative and Restorative Service Providers"

## 2022-01-03 DIAGNOSIS — M25532 Pain in left wrist: Secondary | ICD-10-CM

## 2022-01-03 DIAGNOSIS — M6281 Muscle weakness (generalized): Secondary | ICD-10-CM

## 2022-01-03 DIAGNOSIS — M542 Cervicalgia: Secondary | ICD-10-CM | POA: Diagnosis not present

## 2022-01-03 DIAGNOSIS — R202 Paresthesia of skin: Secondary | ICD-10-CM | POA: Diagnosis not present

## 2022-01-03 NOTE — Therapy (Signed)
OUTPATIENT OCCUPATIONAL THERAPY TREATMENT NOTE   Patient Name: Lindsey Graham MRN: [id number] DOB:01/05/1976, 46 y.o., female Today's Date: 01/03/2022  PCP: Eunice Blase, MD REFERRING PROVIDER: Jessy Oto, MD   OT End of Session - 01/03/22 1057     Visit Number 14    Number of Visits 24    Date for OT Re-Evaluation 01/27/22    OT Start Time 1100    OT Stop Time 1144    OT Time Calculation (min) 44 min    Activity Tolerance Patient tolerated treatment well;No increased pain;Patient limited by fatigue;Patient limited by pain    Behavior During Therapy [facility] for tasks assessed/performed                  Past Medical History:  Diagnosis Date   Anxiety    Concussion    Dyspnea    with COVID   Migraines    PONV (postoperative nausea and vomiting)    Seasonal allergies    Past Surgical History:  Procedure Laterality Date   ANTERIOR CERVICAL DECOMP/DISCECTOMY FUSION N/A 08/09/2021   Procedure: ANTERIOR CERVICAL DISCECTOMY FUSION C4-5 AND C5-6 WITH PLATE, SCREWS, ALLOGRAFT, LOCAL BONE GRAFT, VIVIGEN;  Surgeon: Jessy Oto, MD;  Location: [place];  Service: Orthopedics;  Laterality: N/A;   EYELID LACERATION REPAIR Right    Stye removal   Patient Active Problem List   Diagnosis Date Noted   Other spondylosis with radiculopathy, cervical region    S/P cervical spinal fusion 08/09/2021   Herniation of cervical intervertebral disc with radiculopathy 08/09/2021   Chronic low back pain 03/26/2020   Neck pain 03/26/2020   Other chronic pain 03/26/2020   Nodule of upper lobe of right lung 10/08/2019   Head injury 06/04/2019   Post concussion syndrome 06/04/2019   Chronic migraine 06/04/2019   Back pain 06/04/2019    ONSET DATE: DOS 08/09/21   REFERRING DIAG:  Z98.1 (ICD-10-CM) - S/P cervical spinal fusion  M25.532 (ICD-10-CM) - Pain in left wrist    THERAPY DIAG:  Pain in left wrist  Paresthesia of skin  Muscle weakness  (generalized)  Cervicalgia   PERTINENT HISTORY: Per MD: "Left wrist pain dorsal radial likely this is mild chronic sprain, she is post 2 level CDF C4-6 4 months ago. Has fusion by radiographs start cervical spine ROM, McKenzie exercises, H, M and left UE ROM and strengthening"  PRECAUTIONS: now ~5 months since cervical sx  SUBJECTIVE:  She states that she had a flare up of pain after last session which may have been caused by more resistance with training, though somewhat unlikely since she had no pain or issues during that session.    PAIN:  Are you having pain?  Yes  Rating: 4.5/10 today Lt side of neck (& lesser in Rt side)   OBJECTIVE: (All objective assessments below are from initial evaluation on: 11/07/21 unless otherwise specified.)    HAND DOMINANCE: Right   ADLs: Overall ADLs: Has been asking for help carrying things, opening containers, has had pain when pushing with left hand/wrist, also difficulty with IADL work tasks, etc.    FUNCTIONAL OUTCOME MEASURES: Quick Dash: 75% impairment today 11/07/21  12/16/21: Mikael Spray Dash: 41% impairment today   UE ROM:   Cervical ROM today 11/07/21: L Rot: 30*, R Rot: 32*, Flexion: 12*, Ext: 10* 11/22/21: L Rot: 29*, R Rot: 27*, Flexion: 28*, Ext: 19*  12/16/21: head/neck AROM today: L Rot: 42*, R Rot: 40*, Flexion: 22*, Ext: 17*   Active ROM  Left 11/07/2021 Left 11/22/21 Left  12/01/21 Right  12/01/21 Lt  12/16/21 Rt 12/16/21  Shoulder flexion    157* 157* 156 163  Shoulder abduction    161* 184* 180 180  Shoulder adduction         Shoulder extension    47* 61*    Shoulder internal rotation    44* 53* 33 37  Shoulder external rotation    90* 100* 95 87  Elbow flexion WNL       Elbow extension WNL       Wrist flexion 55 63 63     Wrist extension 67 70 70     (Blank rows = not tested)   UE MMT:  NT at eval but at least 4-/5 MMT though b/l UEs based on observations/other tests     HAND FUNCTION: Grip strength: Right: 65 lbs; Left:  60 pain lbs and Lateral pinch: Right: 13 lbs, Left: 10 painful lbs  12/16/21: Grip strength: Right: 66 lbs; Left: 55  lbs and Lateral pinch: Right: 13 lbs, Left: 12 painful lbs  12/29/21: Lt hand 60# grip today    COORDINATION: no overt issues, can oppose thumb to all fingers, make full fist, but may need measured due to nerve c/o's   SENSATION: Light touch: WFL WFL in hands now, may need monofilament testing, etc., if symptoms extend into fingers. She does state sensory disturbance roughly through radial nerve distribution in left side of neck, shoulder, arm.   EDEMA: none significant     OBSERVATIONS: Eval: Neck overtly stiff, TTP in distal radial ulnar joint, but not TFCC, not in thumb CMC, she states tightness in webspace,  negative scaphoid shift test.   12/16/21: Wrist still stable, no significant TTP, still somewhat aching at end-ranges; wrist hasn't been a big priority in therapy, though we have discussed stretches and strength with putty, etc.      TODAY'S TREATMENT:  01/03/22: Due to increase soreness/pain, OT does [place] again b/l neck (as before: 00% continuous, 1MHz, 0.7 w/cm^2, [place] 6 mins per side), followed by IASTM to b/l upper trap/neck (more manual being done as she states competent with HEP and she can do that more at home, so manual therapy can be done in sessions).  She performs big arc motion shoulder flex/ext, etc., with associated head/neck flex, ext  side reaching and looking side-to-side, etc. OT then edu on proper body mechanics when lifting objects- uses 3# box today, gives cues and supervision for back/neck postures.  She states knee pain and sciatica symptoms are also somewhat limiting this performance and she only tolerates ~6 reps of picking box up from ~5" height to waist level. She does tolerate UBE for 4.5 mins (@ 45 RPM and 4.5 resistance) and also does b/l toss/catch with 0.1# ball functional activity for reaching and head/neck motion to follow ball's motion. No  significant pain or c/o at end.   12/29/21: OT does [place] with her (b/l today on lev scap/upper trap trigger points - 100% continuous, 1MHz, 0.7 w/cm^2, [place] 6 mins per side), while reviewing HEP and new neck isometrics. She is lead through head/neck AROM before starting neck isometrics for review/performance. She tolerates well, then also performs push/pull strength and is stronger/higher resistance now.  Chest Presses: 10# x15 Back Rows: 10# x15  Then, due to continued wrist tightness and soreness around carpals (left wrist), OT reviews wrist stretches with her, use of t-putty, but also adds isometric dart thrower's motion with red t-band to  increase carpal stability. She has no pain with this, is asked to try 2x day at home.    12/23/21: She starts with [place] (b/l today on lev scap/upper trap trigger points - 100% continuous, 1MHz, 0.7 w/cm^2, [place] 6 mins per side), then goes through exercises starting with head/neck motion, shoulder stretches for posterior capsule, light nerve glides, then UBE push/pull activity (4 mins @ 45 RPM and 4.5 resistance) for her functional endurance. OT then edu for head/neck isometric strength in extension, lateral flexion and a review of chink tuck with pressure (flexion isometric). She does well with these, and they are added to HEP to be done at least 3 x day. She states "not feeling any worse" after the session, and "maybe a little better."    PATIENT EDUCATION: Education details: see tx section above for details  Person educated: Patient Education method: Explanation, Demonstration, and Handouts Education comprehension: verbalized understanding, returned demonstration, and needs further education     HOME EXERCISE PROGRAM: Access Code: [email or web address] URL: [email or web address] Prepared by: Benito Mccreedy     GOALS: Goals reviewed with patient? Yes   SHORT TERM GOALS: (STG required if POC>30 days)   Pt will demo/state understanding of initial HEP to  improve pain levels and prerequisite motion. Target date: 11/17/21 Goal status: MET 11/17/21     LONG TERM GOALS:   Pt will improve functional ability by decreased impairment per Quick DASH assessment from 75% to 15% or better, for better quality of life. Target date: 12/16/21 Goal status: 12/16/21: 41% now, largely improved   2.  Pt will improve grip strength in b/l grip from 65Rt/60Lt lbs to at least 75Rt/70Lt lbs for functional use in work/factory IADLs. Target date: 12/16/21 Goal status: 12/16/21: not much progress here, but hasn't been a priority so far   3.  Pt will improve A/ROM in cervical flexion & ext from ~10* each to at least 60*/30* respectively, to have functional motion for tasks like driving, dressing, social participation with friends/family.  Target date: 12/16/21 Goal status: 12/16/21; improved to 22* / 17* today; goal also downgraded from 70*/40* to 60*/30* due to more likely outcomes t this point.    4.  Pt will improve strength in b/l shoulders from 4-/5 MMT to at least 4+/5 MMT (no pain) to have increased functional ability to carry out selfcare and higher-level homecare tasks with no difficulty. Target date: 12/16/21 Goal status: 12/16/21: MET 4+/5 now b/l sh scaption, but needs to continue working on endurance at this level now   5.  Pt will decrease pain at worst from "Moderate" to 2/10 or better to have better sleep and occupational participation in daily roles. Target date: 12/16/21 Goal status: 12/16/21: improving now, down from ~6/10 at rest to ~4/10 now, has been consistently improving.   6. Pt will push/pull up to 50# with no problems/pain to return to work activities. Target date: 01/27/22 Goal status: 12/16/21: NEW GOAL       ASSESSMENT:   CLINICAL IMPRESSION: 01/03/22: She keeps having exacerbations and is only tolerating very low weight (5-10#) exercises; she is also limited by back pain and knee pain and sciatica complaints. Hopefully ergonomics edu today will help prevent  exacerbations. Her progress is somewhat slow recently in part due to theses issues.   12/29/21: Highly encouraging that she had less tightness and radicular c/o since starting neck isometric exercises. Keep on.   12/23/21: Pt now doing targeted head/neck strength with isometrics, tolerating well.      PLAN:  OT FREQUENCY: additional 2x/week   OT DURATION: additional 6 weeks (through 01/27/22)   PLANNED INTERVENTIONS: self care/ADL training, therapeutic exercise, therapeutic activity, manual therapy, scar mobilization, passive range of motion, splinting, ultrasound, fluidotherapy, moist heat, cryotherapy, patient/family education, and coping strategies training   RECOMMENDED OTHER SERVICES: Due to improvement still happening, likely no other referrals needed.    CONSULTED AND AGREED WITH PLAN OF CARE: Patient   PLAN FOR NEXT SESSION:  Continue [place] and manual therapy as she is doing well with HEP for home performance. Review new DTM wrist strength. Advance functional tolerance through dynamic activities as resistive activities has been not tolerated well so far.     Benito Mccreedy, OTR/L, CHT 01/03/2022, 12:52 PM

## 2022-01-05 ENCOUNTER — Ambulatory Visit (INDEPENDENT_AMBULATORY_CARE_PROVIDER_SITE_OTHER): Payer: BC Managed Care – PPO | Admitting: Rehabilitative and Restorative Service Providers"

## 2022-01-05 ENCOUNTER — Encounter: Payer: Self-pay | Admitting: Rehabilitative and Restorative Service Providers"

## 2022-01-05 DIAGNOSIS — R202 Paresthesia of skin: Secondary | ICD-10-CM | POA: Diagnosis not present

## 2022-01-05 DIAGNOSIS — M6281 Muscle weakness (generalized): Secondary | ICD-10-CM

## 2022-01-05 DIAGNOSIS — M542 Cervicalgia: Secondary | ICD-10-CM | POA: Diagnosis not present

## 2022-01-05 DIAGNOSIS — M25532 Pain in left wrist: Secondary | ICD-10-CM

## 2022-01-05 NOTE — Therapy (Signed)
OUTPATIENT OCCUPATIONAL THERAPY TREATMENT NOTE   Patient Name: Lindsey Graham MRN: [id number] DOB:[birth year], 46 y.o., female Today's Date: 01/05/2022  PCP: Eunice Blase, MD REFERRING PROVIDER: Jessy Oto, MD   OT End of Session - 01/05/22 1109     Visit Number 15    Number of Visits 24    Date for OT Re-Evaluation 01/27/22    OT Start Time 1109    OT Stop Time 1140    OT Time Calculation (min) 31 min    Activity Tolerance Patient tolerated treatment well;No increased pain;Patient limited by fatigue;Patient limited by pain    Behavior During Therapy [facility] for tasks assessed/performed              Past Medical History:  Diagnosis Date   Anxiety    Concussion    Dyspnea    with COVID   Migraines    PONV (postoperative nausea and vomiting)    Seasonal allergies    Past Surgical History:  Procedure Laterality Date   ANTERIOR CERVICAL DECOMP/DISCECTOMY FUSION N/A 08/09/2021   Procedure: ANTERIOR CERVICAL DISCECTOMY FUSION C4-5 AND C5-6 WITH PLATE, SCREWS, ALLOGRAFT, LOCAL BONE GRAFT, VIVIGEN;  Surgeon: Jessy Oto, MD;  Location: [place];  Service: Orthopedics;  Laterality: N/A;   EYELID LACERATION REPAIR Right    Stye removal   Patient Active Problem List   Diagnosis Date Noted   Other spondylosis with radiculopathy, cervical region    S/P cervical spinal fusion 08/09/2021   Herniation of cervical intervertebral disc with radiculopathy 08/09/2021   Chronic low back pain 03/26/2020   Neck pain 03/26/2020   Other chronic pain 03/26/2020   Nodule of upper lobe of right lung 10/08/2019   Head injury 06/04/2019   Post concussion syndrome 06/04/2019   Chronic migraine 06/04/2019   Back pain 06/04/2019    ONSET DATE: DOS 08/09/21   REFERRING DIAG:  Z98.1 (ICD-10-CM) - S/P cervical spinal fusion  M25.532 (ICD-10-CM) - Pain in left wrist    THERAPY DIAG:  Pain in left wrist  Paresthesia of skin  Muscle weakness (generalized)  Cervicalgia   PERTINENT  HISTORY: Per MD: "Left wrist pain dorsal radial likely this is mild chronic sprain, she is post 2 level CDF C4-6 4 months ago. Has fusion by radiographs start cervical spine ROM, McKenzie exercises, H, M and left UE ROM and strengthening"  PRECAUTIONS: now ~5 months since cervical sx  SUBJECTIVE:  She states needing to leave a bit early today due to some trouble at home, but she states understanding HEP and that she has been using light hand weighs for strength as well.    PAIN:  Are you having pain?  Yes  Rating: 3/10 today Lt side of neck (& lesser in Rt side)   OBJECTIVE: (All objective assessments below are from initial evaluation on: 11/07/21 unless otherwise specified.)    HAND DOMINANCE: Right   ADLs: Overall ADLs: Has been asking for help carrying things, opening containers, has had pain when pushing with left hand/wrist, also difficulty with IADL work tasks, etc.    FUNCTIONAL OUTCOME MEASURES: Quick Dash: 75% impairment today 11/07/21  12/16/21: Mikael Spray Dash: 41% impairment today   UE ROM:   Cervical ROM today 11/07/21: L Rot: 30*, R Rot: 32*, Flexion: 12*, Ext: 10* 11/22/21: L Rot: 29*, R Rot: 27*, Flexion: 28*, Ext: 19*  12/16/21: head/neck AROM today: L Rot: 42*, R Rot: 40*, Flexion: 22*, Ext: 17*   Active ROM Left 11/07/2021 Left 11/22/21 Left  12/01/21  Right  12/01/21 Lt  12/16/21 Rt 12/16/21  Shoulder flexion    157* 157* 156 163  Shoulder abduction    161* 184* 180 180  Shoulder adduction         Shoulder extension    47* 61*    Shoulder internal rotation    44* 53* 33 37  Shoulder external rotation    90* 100* 95 87  Elbow flexion WNL       Elbow extension WNL       Wrist flexion 55 63 63     Wrist extension 67 70 70     (Blank rows = not tested)   UE MMT:  NT at eval but at least 4-/5 MMT though b/l UEs based on observations/other tests     HAND FUNCTION: Grip strength: Right: 65 lbs; Left: 60 pain lbs and Lateral pinch: Right: 13 lbs, Left: 10 painful  lbs  12/16/21: Grip strength: Right: 66 lbs; Left: 55  lbs and Lateral pinch: Right: 13 lbs, Left: 12 painful lbs  12/29/21: Lt hand 60# grip today    COORDINATION: no overt issues, can oppose thumb to all fingers, make full fist, but may need measured due to nerve c/o's   SENSATION: Light touch: WFL WFL in hands now, may need monofilament testing, etc., if symptoms extend into fingers. She does state sensory disturbance roughly through radial nerve distribution in left side of neck, shoulder, arm.   EDEMA: none significant     OBSERVATIONS: Eval: Neck overtly stiff, TTP in distal radial ulnar joint, but not TFCC, not in thumb CMC, she states tightness in webspace,  negative scaphoid shift test.   12/16/21: Wrist still stable, no significant TTP, still somewhat aching at end-ranges; wrist hasn't been a big priority in therapy, though we have discussed stretches and strength with putty, etc.      TODAY'S TREATMENT:  01/05/22: Due to shortened session, OT only has time for [place] (as before: 100% continuous, 1MHz, 0.7 w/cm^2, [place] 6 mins per side), manual therapy to palpable muscle spasms in levator scap and rear trap/dept area and quick review of HEP including: head/neck AROM, caudal glides, isometric strengthening, wrist stretches and DTM with resistance. She stats understanding and getting a benefit from [place] and manual therapy.   01/03/22: Due to increase soreness/pain, OT does [place] again b/l neck (as before: 00% continuous, 1MHz, 0.7 w/cm^2, [place] 6 mins per side), followed by IASTM to b/l upper trap/neck (more manual being done as she states competent with HEP and she can do that more at home, so manual therapy can be done in sessions).  She performs big arc motion shoulder flex/ext, etc., with associated head/neck flex, ext  side reaching and looking side-to-side, etc. OT then edu on proper body mechanics when lifting objects- uses 3# box today, gives cues and supervision for back/neck postures.  She states  knee pain and sciatica symptoms are also somewhat limiting this performance and she only tolerates ~6 reps of picking box up from ~5" height to waist level. She does tolerate UBE for 4.5 mins (@ 45 RPM and 4.5 resistance) and also does b/l toss/catch with 0.1# ball functional activity for reaching and head/neck motion to follow ball's motion. No significant pain or c/o at end.   12/29/21: OT does [place] with her (b/l today on lev scap/upper trap trigger points - 100% continuous, 1MHz, 0.7 w/cm^2, [place] 6 mins per side), while reviewing HEP and new neck isometrics. She is lead through head/neck AROM  before starting neck isometrics for review/performance. She tolerates well, then also performs push/pull strength and is stronger/higher resistance now.  Chest Presses: 10# x15 Back Rows: 10# x15  Then, due to continued wrist tightness and soreness around carpals (left wrist), OT reviews wrist stretches with her, use of t-putty, but also adds isometric dart thrower's motion with red t-band to increase carpal stability. She has no pain with this, is asked to try 2x day at home.    12/23/21: She starts with [place] (b/l today on lev scap/upper trap trigger points - 100% continuous, 1MHz, 0.7 w/cm^2, [place] 6 mins per side), then goes through exercises starting with head/neck motion, shoulder stretches for posterior capsule, light nerve glides, then UBE push/pull activity (4 mins @ 45 RPM and 4.5 resistance) for her functional endurance. OT then edu for head/neck isometric strength in extension, lateral flexion and a review of chink tuck with pressure (flexion isometric). She does well with these, and they are added to HEP to be done at least 3 x day. She states "not feeling any worse" after the session, and "maybe a little better."    PATIENT EDUCATION: Education details: see tx section above for details  Person educated: Patient Education method: Explanation, Demonstration, and Handouts Education comprehension: verbalized  understanding, returned demonstration, and needs further education     HOME EXERCISE PROGRAM: Access Code: [email or web address] URL: [email or web address] Prepared by: Benito Mccreedy     GOALS: Goals reviewed with patient? Yes   SHORT TERM GOALS: (STG required if POC>30 days)   Pt will demo/state understanding of initial HEP to improve pain levels and prerequisite motion. Target date: 11/17/21 Goal status: MET 11/17/21     LONG TERM GOALS:   Pt will improve functional ability by decreased impairment per Quick DASH assessment from 75% to 15% or better, for better quality of life. Target date: 12/16/21 Goal status: 12/16/21: 41% now, largely improved   2.  Pt will improve grip strength in b/l grip from 65Rt/60Lt lbs to at least 75Rt/70Lt lbs for functional use in work/factory IADLs. Target date: 12/16/21 Goal status: 12/16/21: not much progress here, but hasn't been a priority so far   3.  Pt will improve A/ROM in cervical flexion & ext from ~10* each to at least 60*/30* respectively, to have functional motion for tasks like driving, dressing, social participation with friends/family.  Target date: 12/16/21 Goal status: 12/16/21; improved to 22* / 17* today; goal also downgraded from 70*/40* to 60*/30* due to more likely outcomes t this point.    4.  Pt will improve strength in b/l shoulders from 4-/5 MMT to at least 4+/5 MMT (no pain) to have increased functional ability to carry out selfcare and higher-level homecare tasks with no difficulty. Target date: 12/16/21 Goal status: 12/16/21: MET 4+/5 now b/l sh scaption, but needs to continue working on endurance at this level now   5.  Pt will decrease pain at worst from "Moderate" to 2/10 or better to have better sleep and occupational participation in daily roles. Target date: 12/16/21 Goal status: 12/16/21: improving now, down from ~6/10 at rest to ~4/10 now, has been consistently improving.   6. Pt will push/pull up to 50# with no problems/pain  to return to work activities. Target date: 01/27/22 Goal status: 12/16/21: NEW GOAL       ASSESSMENT:   CLINICAL IMPRESSION: 01/05/22: Pt had a short session today, we will try to work back into dynamic functional activities next session as able  01/03/22: She keeps  having exacerbations and is only tolerating very low weight (5-10#) exercises; she is also limited by back pain and knee pain and sciatica complaints. Hopefully ergonomics edu today will help prevent exacerbations. Her progress is somewhat slow recently in part due to theses issues.    PLAN: OT FREQUENCY: additional 2x/week   OT DURATION: additional 6 weeks (through 01/27/22)   PLANNED INTERVENTIONS: self care/ADL training, therapeutic exercise, therapeutic activity, manual therapy, scar mobilization, passive range of motion, splinting, ultrasound, fluidotherapy, moist heat, cryotherapy, patient/family education, and coping strategies training   RECOMMENDED OTHER SERVICES: Due to improvement still happening, likely no other referrals needed.    CONSULTED AND AGREED WITH PLAN OF CARE: Patient   PLAN FOR NEXT SESSION:  Continue [place] and manual therapy, perform dynamic activities like UBE, DTM, ball toss/catch, push/pull as tolerated, etc. Advance as tolerated     Benito Mccreedy, OTR/L, CHT 01/05/2022, 11:52 AM

## 2022-01-12 ENCOUNTER — Encounter: Payer: Self-pay | Admitting: Rehabilitative and Restorative Service Providers"

## 2022-01-12 ENCOUNTER — Ambulatory Visit (INDEPENDENT_AMBULATORY_CARE_PROVIDER_SITE_OTHER): Payer: BC Managed Care – PPO | Admitting: Rehabilitative and Restorative Service Providers"

## 2022-01-12 DIAGNOSIS — M6281 Muscle weakness (generalized): Secondary | ICD-10-CM | POA: Diagnosis not present

## 2022-01-12 DIAGNOSIS — M25532 Pain in left wrist: Secondary | ICD-10-CM | POA: Diagnosis not present

## 2022-01-12 DIAGNOSIS — R202 Paresthesia of skin: Secondary | ICD-10-CM

## 2022-01-12 DIAGNOSIS — M542 Cervicalgia: Secondary | ICD-10-CM

## 2022-01-12 NOTE — Therapy (Signed)
OUTPATIENT OCCUPATIONAL THERAPY TREATMENT NOTE   Patient Name: Lindsey Graham MRN: [id number] DOB:02/22/1976, 46 y.o., female Today's Date: 01/12/2022  PCP: Eunice Blase, MD REFERRING PROVIDER: Jessy Oto, MD   OT End of Session - 01/12/22 1140     Visit Number 16    Number of Visits 24    Date for OT Re-Evaluation 01/27/22    OT Start Time 1140    OT Stop Time 1236    OT Time Calculation (min) 56 min    Activity Tolerance Patient tolerated treatment well;No increased pain;Patient limited by fatigue;Patient limited by pain    Behavior During Therapy [facility] for tasks assessed/performed               Past Medical History:  Diagnosis Date   Anxiety    Concussion    Dyspnea    with COVID   Migraines    PONV (postoperative nausea and vomiting)    Seasonal allergies    Past Surgical History:  Procedure Laterality Date   ANTERIOR CERVICAL DECOMP/DISCECTOMY FUSION N/A 08/09/2021   Procedure: ANTERIOR CERVICAL DISCECTOMY FUSION C4-5 AND C5-6 WITH PLATE, SCREWS, ALLOGRAFT, LOCAL BONE GRAFT, VIVIGEN;  Surgeon: Jessy Oto, MD;  Location: [place];  Service: Orthopedics;  Laterality: N/A;   EYELID LACERATION REPAIR Right    Stye removal   Patient Active Problem List   Diagnosis Date Noted   Other spondylosis with radiculopathy, cervical region    S/P cervical spinal fusion 08/09/2021   Herniation of cervical intervertebral disc with radiculopathy 08/09/2021   Chronic low back pain 03/26/2020   Neck pain 03/26/2020   Other chronic pain 03/26/2020   Nodule of upper lobe of right lung 10/08/2019   Head injury 06/04/2019   Post concussion syndrome 06/04/2019   Chronic migraine 06/04/2019   Back pain 06/04/2019    ONSET DATE: DOS 08/09/21   REFERRING DIAG:  Z98.1 (ICD-10-CM) - S/P cervical spinal fusion  M25.532 (ICD-10-CM) - Pain in left wrist    THERAPY DIAG:  Pain in left wrist  Paresthesia of skin  Muscle weakness (generalized)  Cervicalgia   PERTINENT  HISTORY: Per MD: "Left wrist pain dorsal radial likely this is mild chronic sprain, she is post 2 level CDF C4-6 4 months ago. Has fusion by radiographs start cervical spine ROM, McKenzie exercises, H, M and left UE ROM and strengthening"  PRECAUTIONS: now ~5 months since cervical sx  SUBJECTIVE:  She states still having some soreness, numbness, etc. OT tries to reinforce that we will focus on function, not subjective complaints as they have been fairly unchanging.    PAIN:  Are you having pain?  Yes  Rating: 4.5/10 today Lt side of neck (& lesser in Rt side)   OBJECTIVE: (All objective assessments below are from initial evaluation on: 11/07/21 unless otherwise specified.)    HAND DOMINANCE: Right   ADLs: Overall ADLs: Has been asking for help carrying things, opening containers, has had pain when pushing with left hand/wrist, also difficulty with IADL work tasks, etc.    FUNCTIONAL OUTCOME MEASURES: Quick Dash: 75% impairment today 11/07/21  12/16/21: Mikael Spray Dash: 41% impairment today   UE ROM:   Cervical ROM today 11/07/21: L Rot: 30*, R Rot: 32*, Flexion: 12*, Ext: 10* 11/22/21: L Rot: 29*, R Rot: 27*, Flexion: 28*, Ext: 19*  12/16/21: head/neck AROM today: L Rot: 42*, R Rot: 40*, Flexion: 22*, Ext: 17*   Active ROM Left 11/07/2021 Left 11/22/21 Left  12/01/21 Right  12/01/21 Lt  12/16/21 Rt 12/16/21  Shoulder flexion    157* 157* 156 163  Shoulder abduction    161* 184* 180 180  Shoulder adduction         Shoulder extension    47* 61*    Shoulder internal rotation    44* 53* 33 37  Shoulder external rotation    90* 100* 95 87  Elbow flexion WNL       Elbow extension WNL       Wrist flexion 55 63 63     Wrist extension 67 70 70     (Blank rows = not tested)   UE MMT:  NT at eval but at least 4-/5 MMT though b/l UEs based on observations/other tests     HAND FUNCTION: Grip strength: Right: 65 lbs; Left: 60 pain lbs and Lateral pinch: Right: 13 lbs, Left: 10 painful  lbs  12/16/21: Grip strength: Right: 66 lbs; Left: 55  lbs and Lateral pinch: Right: 13 lbs, Left: 12 painful lbs  12/29/21: Lt hand 60# grip today   01/12/22: Right: 80# ; Left: 70#  improved b/l    COORDINATION: no overt issues, can oppose thumb to all fingers, make full fist, but may need measured due to nerve c/o's   SENSATION: Light touch: WFL WFL in hands now, may need monofilament testing, etc., if symptoms extend into fingers. She does state sensory disturbance roughly through radial nerve distribution in left side of neck, shoulder, arm.   EDEMA: none significant     OBSERVATIONS: Eval: Neck overtly stiff, TTP in distal radial ulnar joint, but not TFCC, not in thumb CMC, she states tightness in webspace,  negative scaphoid shift test.   12/16/21: Wrist still stable, no significant TTP, still somewhat aching at end-ranges; wrist hasn't been a big priority in therapy, though we have discussed stretches and strength with putty, etc.      TODAY'S TREATMENT:  01/12/22: Due to continued spasm/pain in left side neck/scap, OT begins with [place] tx to left side of scap today (100% continuous, 1MHz, 0.7 w/cm^2, [place] 7 mins) & manual therapy to palpable muscle spasms in b/l levator scap and rear trap/dept areas. Next, OT leads her through dynamic, bilateral reaching and head/neck movement patterns with self-overpressure at end-ranges, followed by UBE for 5 mins (@ 50 RPM and working up to 5.0 resistance today). She then does toss/catch activities with reaching overhead, low and to sides to facilitate spontaneous head/neck tracking as well as b/l UE quick coordination training. She also does hand grip and FA training with green flexbar in pro, sup, wrist flex and ext x10; fnl push & pull with 10# x 15 for each and 2 sets of each. Bicep curls 10# b/l rope x20 as well. She finishes with a cool pack 49mns to back of neck/shoulders to help with soreness post treatment.   01/05/22: Due to shortened session, OT only  has time for [place](as before: 100% continuous, 1MHz, 0.7 w/cm^2, [place] mins per side), manual therapy to palpable muscle spasms in levator scap and rear trap/dept area and quick review of HEP including: head/neck AROM, caudal glides, isometric strengthening, wrist stretches and DTM with resistance. She stats understanding and getting a benefit from [place] manual therapy.    PATIENT EDUCATION: Education details: see tx section above for details  Person educated: Patient Education method: Explanation, Demonstration, and Handouts Education comprehension: verbalized understanding, returned demonstration, and needs further education     HOME EXERCISE PROGRAM: Access Code:  [email or web address] URL: [email or web address] Prepared by: Benito Mccreedy     GOALS: Goals reviewed with patient? Yes   SHORT TERM GOALS: (STG required if POC>30 days)   Pt will demo/state understanding of initial HEP to improve pain levels and prerequisite motion. Target date: 11/17/21 Goal status: MET 11/17/21     LONG TERM GOALS:   Pt will improve functional ability by decreased impairment per Quick DASH assessment from 75% to 15% or better, for better quality of life. Target date: 01/27/22 Goal status: 12/16/21: 41% now, largely improved   2.  Pt will improve grip strength in b/l grip from 65Rt/60Lt lbs to at least 75Rt/70Lt lbs for functional use in work/factory IADLs. Target date: 01/27/22 Goal status: 12/16/21: not much progress here, but hasn't been a priority so far   3.  Pt will improve A/ROM in cervical flexion & ext from ~10* each to at least 60*/30* respectively, to have functional motion for tasks like driving, dressing, social participation with friends/family.  Target date: 01/27/22 Goal status: 12/16/21; improved to 22* / 17* today; goal also downgraded from 70*/40* to 60*/30* due to more likely outcomes t this point.    4.  Pt will improve strength in b/l shoulders from 4-/5 MMT to at least 4+/5 MMT (no  pain) to have increased functional ability to carry out selfcare and higher-level homecare tasks with no difficulty. Target date: 12/16/21 Goal status: 12/16/21: MET 4+/5 now b/l sh scaption, but needs to continue working on endurance at this level now   5.  Pt will decrease pain at worst from "Moderate" to 2/10 or better to have better sleep and occupational participation in daily roles. Target date: 01/27/22 Goal status: 12/16/21: improving now, down from ~6/10 at rest to ~4/10 now, has been consistently improving.   6. Pt will push/pull up to 50# with no problems/pain to return to work activities. Target date: 01/27/22 Goal status: 12/16/21: NEW GOAL       ASSESSMENT:   CLINICAL IMPRESSION: 01/12/22: Continues to increase resistance and fnl ability, but she also continues to state soreness, numbness exacerbations, scapular spasms and pain as well as comorbid knee/back issues.  Progress has been slow, but OT is trying to encourage her to gently stretch and keep her body moving when outside of therapy to reduce pains.     PLAN: OT FREQUENCY: additional 2x/week   OT DURATION: additional 6 weeks (through 01/27/22)   PLANNED INTERVENTIONS: self care/ADL training, therapeutic exercise, therapeutic activity, manual therapy, scar mobilization, passive range of motion, splinting, ultrasound, fluidotherapy, moist heat, cryotherapy, patient/family education, and coping strategies training   RECOMMENDED OTHER SERVICES: Due to improvement still happening, likely no other referrals needed.    CONSULTED AND AGREED WITH PLAN OF CARE: Patient   PLAN FOR NEXT SESSION:  Continue per POC, increasing resistance and fnl tolerance. 2 more weeks on current POC.     Benito Mccreedy, OTR/L, CHT 01/12/2022, 12:39 PM

## 2022-01-17 ENCOUNTER — Encounter: Payer: Self-pay | Admitting: Internal Medicine

## 2022-01-17 ENCOUNTER — Ambulatory Visit (INDEPENDENT_AMBULATORY_CARE_PROVIDER_SITE_OTHER): Payer: BC Managed Care – PPO | Admitting: Internal Medicine

## 2022-01-17 VITALS — BP 122/76 | HR 80 | Ht 66.0 in | Wt 167.2 lb

## 2022-01-17 DIAGNOSIS — K5900 Constipation, unspecified: Secondary | ICD-10-CM

## 2022-01-17 DIAGNOSIS — K219 Gastro-esophageal reflux disease without esophagitis: Secondary | ICD-10-CM | POA: Diagnosis not present

## 2022-01-17 MED ORDER — LINACLOTIDE 145 MCG PO CAPS: 145.0000 ug | ORAL_CAPSULE | Freq: Every day | ORAL | 3 refills | Status: DC

## 2022-01-17 MED ORDER — HYDROCORTISONE (PERIANAL) 2.5 % EX CREA: 1.0000 "application " | TOPICAL_CREAM | Freq: Two times a day (BID) | CUTANEOUS | 1 refills | Status: DC

## 2022-01-17 NOTE — Progress Notes (Signed)
   Chief Complaint: Constipation, abdominal pain, rectal bleeding  HPI : 46 year old female with history of seasonal allergies, anxiety, and migraines presents for follow up of constipation  Interval History: She feels like the Linzess is helping. She has been going more regularly to have a BM. Endorses chest burning or regurgitation. Abdominal discomfort is present.  Patient only did a half course of Anusol suppositories because it was quite expensive.  She feels like overall she is still recovering from her spine surgery from 07/2021.   Current Outpatient Medications  Medication Sig Dispense Refill   Cholecalciferol (VITAMIN D3) 250 MCG (10000 UT) TABS Take 10,000 Units by mouth daily.     hydrocortisone (ANUSOL-HC) 25 MG suppository Place 1 suppository (25 mg total) rectally 2 (two) times daily. 14 suppository 0   linaclotide (LINZESS) 145 MCG CAPS capsule Take 1 capsule (145 mcg total) by mouth daily before breakfast. 30 capsule 2   loratadine (CLARITIN) 10 MG tablet Take 10 mg by mouth daily as needed for allergies.     ondansetron (ZOFRAN) 8 MG tablet Take 1 tablet (8 mg total) by mouth in the morning, at noon, and at bedtime. 30 tablet 2   rizatriptan (MAXALT) 10 MG tablet Take 1 tablet (10 mg total) by mouth as needed. 9 tablet 11   sertraline (ZOLOFT) 50 MG tablet TAKE 1 AND 1/2 TABLETS DAILY BY MOUTH 135 tablet 4   No current facility-administered medications for this visit.   Review of Systems: All systems reviewed and negative except where noted in HPI.   Physical Exam: BP 122/76   Pulse 80   Ht 5\' 6"  (1.676 m)   Wt 167 lb 3.2 oz (75.8 kg)   SpO2 98%   BMI 26.99 kg/m  Constitutional: Pleasant,well-developed, female in no acute distress. HEENT: Normocephalic and atraumatic. Conjunctivae are normal. No scleral icterus. Cardiovascular: Normal rate, regular rhythm.  Pulmonary/chest: Effort normal and breath sounds normal. No wheezing, rales or rhonchi. Abdominal: Soft,  nondistended, non-tender. Bowel sounds active throughout. There are no masses palpable. No hepatomegaly. Extremities: No edema Neurological: Alert and oriented to person place and time. Skin: Skin is warm and dry. No rashes noted. Psychiatric: Normal mood and affect. Behavior is normal.  Labs 11/2020: TSH nml  Labs 07/2021: CBC, CMP, and INR nml  Labs 11/2021: TTG IgA nml. IgA nml. Cortisol nml.   Colonoscopy 12/12/21: - The terminal ileum appeared normal. - Non-bleeding internal hemorrhoids were found during retroflexion.  ASSESSMENT AND PLAN: Rectal bleeding Lower abdominal discomfort Constipation Nausea GERD Colon cancer screening Patient presents for follow-up of discomfort and constipation issues.  Recent colonoscopy was reassuring and only showed internal hemorrhoids.  Will prescribe her some Anusol HC cream to see if this helps with her symptoms.  Encouraged her to avoid straining when having a BM.  The patient has had some improvement in her constipation symptoms on Linzess therapy currently. - Zofran 8 mg TID PRN - Linzess 145 mcg QD. Refill today - Anusol HC cream BID 7 days. Use with Preparation H suppositories - Recticare samples - RTC 3 months. If doing well at next visit, can spread out to every 6 months. Consider rechecking LFTs in the future or offering hemorrhoidal banding  02/11/22, MD

## 2022-01-17 NOTE — Patient Instructions (Signed)
We have sent the following medications to your pharmacy for you to pick up at your convenience:   Linzess  Anusol Cream   You can use OTC Preparation H Suppositories along with OTC Recticare-you can use the cream on the suppositories    If you are age 48 or older, your body mass index should be between 23-30. Your Body mass index is 26.99 kg/m. If this is out of the aforementioned range listed, please consider follow up with your Primary Care Provider.  If you are age 65 or younger, your body mass index should be between 19-25. Your Body mass index is 26.99 kg/m. If this is out of the aformentioned range listed, please consider follow up with your Primary Care Provider.   ________________________________________________________  The [facility] providers would like to encourage you to use [facility] to communicate with providers for non-urgent requests or questions.  Due to long hold times on the telephone, sending your provider a message by [facility] may be a faster and more efficient way to get a response.  Please allow 48 business hours for a response.  Please remember that this is for non-urgent requests.  _______________________________________________________   I appreciate the  opportunity to care for you  Thank You   Nicole Kindred Dorsey,MD

## 2022-01-19 ENCOUNTER — Ambulatory Visit (INDEPENDENT_AMBULATORY_CARE_PROVIDER_SITE_OTHER): Payer: BC Managed Care – PPO | Admitting: Rehabilitative and Restorative Service Providers"

## 2022-01-19 ENCOUNTER — Encounter: Payer: Self-pay | Admitting: Rehabilitative and Restorative Service Providers"

## 2022-01-19 DIAGNOSIS — M6281 Muscle weakness (generalized): Secondary | ICD-10-CM | POA: Diagnosis not present

## 2022-01-19 DIAGNOSIS — R202 Paresthesia of skin: Secondary | ICD-10-CM | POA: Diagnosis not present

## 2022-01-19 DIAGNOSIS — M25532 Pain in left wrist: Secondary | ICD-10-CM

## 2022-01-19 DIAGNOSIS — M542 Cervicalgia: Secondary | ICD-10-CM

## 2022-01-19 NOTE — Therapy (Signed)
OUTPATIENT OCCUPATIONAL THERAPY TREATMENT NOTE   Patient Name: Lindsey Graham MRN: [id number] DOB:Dec 26, 1975, 46 y.o., female Today's Date: 01/19/2022  PCP: Eunice Blase, MD REFERRING PROVIDER: Jessy Oto, MD   OT End of Session - 01/19/22 1127     Visit Number 17    Number of Visits 24    Date for OT Re-Evaluation 01/27/22    OT Start Time 1110    OT Stop Time 1152    OT Time Calculation (min) 42 min    Activity Tolerance Patient tolerated treatment well;No increased pain;Patient limited by fatigue    Behavior During Therapy [facility] for tasks assessed/performed             Past Medical History:  Diagnosis Date   Anxiety    Concussion    Dyspnea    with COVID   Migraines    PONV (postoperative nausea and vomiting)    Seasonal allergies    Past Surgical History:  Procedure Laterality Date   ANTERIOR CERVICAL DECOMP/DISCECTOMY FUSION N/A 08/09/2021   Procedure: ANTERIOR CERVICAL DISCECTOMY FUSION C4-5 AND C5-6 WITH PLATE, SCREWS, ALLOGRAFT, LOCAL BONE GRAFT, VIVIGEN;  Surgeon: Jessy Oto, MD;  Location: [place];  Service: Orthopedics;  Laterality: N/A;   EYELID LACERATION REPAIR Right    Stye removal   Patient Active Problem List   Diagnosis Date Noted   Other spondylosis with radiculopathy, cervical region    S/P cervical spinal fusion 08/09/2021   Herniation of cervical intervertebral disc with radiculopathy 08/09/2021   Chronic low back pain 03/26/2020   Neck pain 03/26/2020   Other chronic pain 03/26/2020   Nodule of upper lobe of right lung 10/08/2019   Head injury 06/04/2019   Post concussion syndrome 06/04/2019   Chronic migraine 06/04/2019   Back pain 06/04/2019    ONSET DATE: DOS 08/09/21   REFERRING DIAG:  Z98.1 (ICD-10-CM) - S/P cervical spinal fusion  M25.532 (ICD-10-CM) - Pain in left wrist    THERAPY DIAG:  Pain in left wrist  Muscle weakness (generalized)  Paresthesia of skin  Cervicalgia   PERTINENT HISTORY: Per MD: "Left wrist  pain dorsal radial likely this is mild chronic sprain, she is post 2 level CDF C4-6 4 months ago. Has fusion by radiographs start cervical spine ROM, McKenzie exercises, H, M and left UE ROM and strengthening"  PRECAUTIONS: now ~19 weeks since cervical sx  SUBJECTIVE:  She states feeling more comfortable to "push" in therapy (less anxious), but also concerned about doing heavy work tasks still, especially with current MD restrictions of 0-15#. She does have MD f/u next week.    PAIN:  Are you having pain?  Yes  Rating: 4.5/10 today Lt side of neck (& lesser in Rt side)   OBJECTIVE: (All objective assessments below are from initial evaluation on: 11/07/21 unless otherwise specified.)    HAND DOMINANCE: Right   ADLs: Overall ADLs: Has been asking for help carrying things, opening containers, has had pain when pushing with left hand/wrist, also difficulty with IADL work tasks, etc.    FUNCTIONAL OUTCOME MEASURES: Quick Dash: 75% impairment today 11/07/21  12/16/21: Mikael Spray Dash: 41% impairment today   UE ROM:   Cervical ROM today 11/07/21: L Rot: 30*, R Rot: 32*, Flexion: 12*, Ext: 10* 11/22/21: L Rot: 29*, R Rot: 27*, Flexion: 28*, Ext: 19*  12/16/21: head/neck AROM today: L Rot: 42*, R Rot: 40*, Flexion: 22*, Ext: 17*    Active ROM Left 11/07/2021 Left 11/22/21 Left  12/01/21 Right  12/01/21 Lt  12/16/21 Rt 12/16/21  Shoulder flexion    157* 157* 156 163  Shoulder abduction    161* 184* 180 180  Shoulder adduction         Shoulder extension    47* 61*    Shoulder internal rotation    44* 53* 33 37  Shoulder external rotation    90* 100* 95 87  Elbow flexion WNL       Elbow extension WNL       Wrist flexion 55 63 63     Wrist extension 67 70 70     (Blank rows = not tested)   UE MMT:  NT at eval but at least 4-/5 MMT though b/l UEs based on observations/other tests     HAND FUNCTION: Grip strength: Right: 65 lbs; Left: 60 pain lbs and Lateral pinch: Right: 13 lbs, Left: 10  painful lbs  12/16/21: Grip strength: Right: 66 lbs; Left: 55  lbs and Lateral pinch: Right: 13 lbs, Left: 12 painful lbs  12/29/21: Lt hand 60# grip today   01/12/22: Right: 80# ; Left: 70#  improved b/l    COORDINATION: no overt issues, can oppose thumb to all fingers, make full fist, but may need measured due to nerve c/o's   SENSATION: Light touch: WFL WFL in hands now, may need monofilament testing, etc., if symptoms extend into fingers. She does state sensory disturbance roughly through radial nerve distribution in left side of neck, shoulder, arm.   EDEMA: none significant     OBSERVATIONS: Eval: Neck overtly stiff, TTP in distal radial ulnar joint, but not TFCC, not in thumb CMC, she states tightness in webspace,  negative scaphoid shift test.   12/16/21: Wrist still stable, no significant TTP, still somewhat aching at end-ranges; wrist hasn't been a big priority in therapy, though we have discussed stretches and strength with putty, etc.      TODAY'S TREATMENT:  01/19/22: We continue with [place] tx to left side of scap today (100% continuous, 1MHz, 0.7 w/cm^2, [place] 8 mins) & manual therapy to palpable muscle spasms in left levator scap and rear trap/dept area- these knots feel much better today and less TTP, etc. Very positive.  She also seems to tolerate functional endurance activities much better today, despite more resistance.   UBE for 5 mins (@ 50 RPM and working up to 5.0 resistance today fnl push  10# x15, 15# x10 pull with 15# x15, 20# x10 Neck ext isometrics x3 b/l with 5 sec holds    01/12/22: Due to continued spasm/pain in left side neck/scap, OT begins with [place] tx to left side of scap today (100% continuous, 1MHz, 0.7 w/cm^2, [place] 7 mins) & manual therapy to palpable muscle spasms in b/l levator scap and rear trap/dept areas. Next, OT leads her through dynamic, bilateral reaching and head/neck movement patterns with self-overpressure at end-ranges, followed by UBE for 5 mins (@ 50 RPM  and working up to 5.0 resistance today). She then does toss/catch activities with reaching overhead, low and to sides to facilitate spontaneous head/neck tracking as well as b/l UE quick coordination training. She also does hand grip and FA training with green flexbar in pro, sup, wrist flex and ext x10; fnl push & pull with 10# x 15 for each and 2 sets of each. Bicep curls 10# b/l rope x20 as well. She finishes with a cool pack 2mns to back of neck/shoulders to help with soreness post treatment.   PATIENT EDUCATION:  Education details: see tx section above for details  Person educated: Patient Education method: Explanation, Demonstration, and Handouts Education comprehension: verbalized understanding, returned demonstration, and needs further education     HOME EXERCISE PROGRAM: Access Code: [email or web address] URL: [email or web address] Prepared by: Benito Mccreedy     GOALS: Goals reviewed with patient? Yes   SHORT TERM GOALS: (STG required if POC>30 days)   Pt will demo/state understanding of initial HEP to improve pain levels and prerequisite motion. Target date: 11/17/21 Goal status: MET 11/17/21     LONG TERM GOALS:   Pt will improve functional ability by decreased impairment per Quick DASH assessment from 75% to 15% or better, for better quality of life. Target date: 01/27/22 Goal status: 12/16/21: 41% now, largely improved   2.  Pt will improve grip strength in b/l grip from 65Rt/60Lt lbs to at least 75Rt/70Lt lbs for functional use in work/factory IADLs. Target date: 01/27/22 Goal status: 12/16/21: not much progress here, but hasn't been a priority so far   3.  Pt will improve A/ROM in cervical flexion & ext from ~10* each to at least 60*/30* respectively, to have functional motion for tasks like driving, dressing, social participation with friends/family.  Target date: 01/27/22 Goal status: 12/16/21; improved to 22* / 17* today; goal also downgraded from 70*/40* to 60*/30* due  to more likely outcomes t this point.    4.  Pt will improve strength in b/l shoulders from 4-/5 MMT to at least 4+/5 MMT (no pain) to have increased functional ability to carry out selfcare and higher-level homecare tasks with no difficulty. Target date: 12/16/21 Goal status: 12/16/21: MET 4+/5 now b/l sh scaption, but needs to continue working on endurance at this level now   5.  Pt will decrease pain at worst from "Moderate" to 2/10 or better to have better sleep and occupational participation in daily roles. Target date: 01/27/22 Goal status: 12/16/21: improving now, down from ~6/10 at rest to ~4/10 now, has been consistently improving.   6. Pt will push/pull up to 50# with no problems/pain to return to work activities. Target date: 01/27/22 Goal status: 12/16/21: NEW GOAL       ASSESSMENT:   CLINICAL IMPRESSION: 01/19/22: She is tolerating higher resistance, more time spent on dynamic fnl activities, etc. We will likely need to continue past reassessment visit next week, if MD allows her to increase weight restrictions, etc. to help safely push to new levels which are needed in her work environment.     PLAN: OT FREQUENCY: additional 2x/week   OT DURATION: additional 6 weeks (through 01/27/22)   PLANNED INTERVENTIONS: self care/ADL training, therapeutic exercise, therapeutic activity, manual therapy, scar mobilization, passive range of motion, splinting, ultrasound, fluidotherapy, moist heat, cryotherapy, patient/family education, and coping strategies training   RECOMMENDED OTHER SERVICES: Due to improvement still happening, likely no other referrals needed.    CONSULTED AND AGREED WITH PLAN OF CARE: Patient   PLAN FOR NEXT SESSION:  Reassess after MD f/u and determine progress toward goals, motion, new restrictions, etc.    Benito Mccreedy, OTR/L, CHT 01/19/2022, 2:49 PM

## 2022-01-25 ENCOUNTER — Encounter: Payer: BC Managed Care – PPO | Admitting: Rehabilitative and Restorative Service Providers"

## 2022-01-26 ENCOUNTER — Encounter: Payer: Self-pay | Admitting: Specialist

## 2022-01-26 ENCOUNTER — Ambulatory Visit (INDEPENDENT_AMBULATORY_CARE_PROVIDER_SITE_OTHER): Payer: BC Managed Care – PPO | Admitting: Specialist

## 2022-01-26 ENCOUNTER — Ambulatory Visit (INDEPENDENT_AMBULATORY_CARE_PROVIDER_SITE_OTHER): Payer: BC Managed Care – PPO

## 2022-01-26 VITALS — BP 100/74 | HR 93 | Ht 66.0 in | Wt 167.2 lb

## 2022-01-26 DIAGNOSIS — Z981 Arthrodesis status: Secondary | ICD-10-CM

## 2022-01-26 DIAGNOSIS — M5416 Radiculopathy, lumbar region: Secondary | ICD-10-CM | POA: Diagnosis not present

## 2022-01-26 NOTE — Patient Instructions (Signed)
Avoid overhead lifting and overhead use of the arms. Do not lift greater than 5-10 lbs. Adjust head rest in vehicle to prevent hyperextension if rear ended. Take extra precautions to avoid falling.   

## 2022-01-26 NOTE — Progress Notes (Signed)
Office Visit Note   Patient: Lindsey Graham           Date of Birth: 1975-10-19           MRN: [id number] Visit Date: 01/26/2022              Requested by: Lavada Mesi, MD [address] [place],  [place] [address] PCP: Lavada Mesi, MD   Assessment & Plan: Visit Diagnoses:  1. S/P cervical spinal fusion   2. Lumbar radiculopathy     Plan: Avoid overhead lifting and overhead use of the arms. Do not lift greater than 5-10 lbs. Adjust head rest in vehicle to prevent hyperextension if rear ended. Take extra precautions to avoid falling.    Follow-Up Instructions: Return in about 4 weeks (around 02/23/2022).   Orders:  Orders Placed This Encounter  Procedures   XR Cervical Spine 2 or 3 views   No orders of the defined types were placed in this encounter.     Procedures: No procedures performed   Clinical Data: No additional findings.   Subjective: Chief Complaint  Patient presents with   Lower Back - Follow-up   Neck - Follow-up    46 year old right handed female with history of 2 level ACDF C4-5 and C5-6. She had injection of the right lumbar area with some relief but it sees to be wearing off.Numbness and tingling into the left little fingera and ring finger.    Review of Systems  Constitutional: Negative.   HENT: Negative.    Eyes: Negative.   Respiratory: Negative.    Cardiovascular: Negative.   Gastrointestinal: Negative.   Endocrine: Negative.   Genitourinary: Negative.   Musculoskeletal: Negative.   Skin: Negative.   Allergic/Immunologic: Negative.   Neurological: Negative.   Hematological: Negative.   Psychiatric/Behavioral: Negative.       Objective: Vital Signs: BP 100/74   Pulse 93   Ht 5\' 6"  (1.676 m)   Wt 167 lb 3.2 oz (75.8 kg)   BMI 26.99 kg/m   Physical Exam Constitutional:      Appearance: She is well-developed.  HENT:     Head: Normocephalic and atraumatic.  Eyes:     Pupils: Pupils are equal, round, and  reactive to light.  Pulmonary:     Effort: Pulmonary effort is normal.     Breath sounds: Normal breath sounds.  Abdominal:     General: Bowel sounds are normal.     Palpations: Abdomen is soft.  Musculoskeletal:     Cervical back: Normal range of motion and neck supple.     Lumbar back: Negative right straight leg raise test and negative left straight leg raise test.  Skin:    General: Skin is warm and dry.  Neurological:     Mental Status: She is alert and oriented to person, place, and time.  Psychiatric:        Behavior: Behavior normal.        Thought Content: Thought content normal.        Judgment: Judgment normal.    Back Exam   Tenderness  The patient is experiencing tenderness in the cervical.  Range of Motion  Extension:  abnormal  Flexion:  abnormal  Lateral bend right:  abnormal  Rotation right:  abnormal  Rotation left:  abnormal   Muscle Strength  Right Hamstrings:  5/5  Left Hamstrings:  5/5   Tests  Straight leg raise right: negative Straight leg raise left: negative  Reflexes  Patellar:  2/4 Achilles:  2/4 Biceps:  2/4 Babinski's sign: abnormal   Other  Toe walk: normal Heel walk: normal Sensation: normal Erythema: no back redness Scars: absent     Specialty Comments:  MRI LUMBAR SPINE WITHOUT CONTRAST   TECHNIQUE: Multiplanar, multisequence MR imaging of the lumbar spine was performed. No intravenous contrast was administered.   COMPARISON:  Lumbar MRI 10/27/2019   FINDINGS: Segmentation: S1 is a fully lumbarized vertebral body. There is a normal disc space at S1-2. Numbering consistent with the prior MRI report.   Alignment:  Mild retrolisthesis L3-4, L4-5, L5-S1.   Vertebrae:  Normal bone marrow.  Negative for fracture or mass.   Conus medullaris and cauda equina: Conus extends to the L1-2 level. Conus and cauda equina appear normal.   Paraspinal and other soft tissues: Negative for paraspinous mass or adenopathy.    Disc levels:   L1-2: Negative   L2-3: Negative   L3-4: Negative   L4-5: Mild disc bulging with small central disc protrusion unchanged from prior. Mild facet hypertrophy. Negative for spinal or foraminal stenosis.   L5-S1: Shallow broad-based disc protrusion. Central annular fissure with small central disc protrusion unchanged. Bilateral facet and ligamentum flavum hypertrophy. Mild spinal stenosis and moderate subarticular stenosis bilaterally. No interval change.   S1-2: Negative   IMPRESSION: S1 is a fully lumbarized vertebra.   Disc bulging and small central disc protrusion L4-5 unchanged   Shallow central disc protrusion L5-S1 with bilateral facet hypertrophy. Mild spinal stenosis and moderate subarticular stenosis bilaterally, without interval change.     Electronically Signed   By: Marlan Palau M.D.   On: 07/02/2020 15:01  Imaging: XR Cervical Spine 2 or 3 views  Result Date: 01/26/2022 AP and lateral flexion and extension radiographs show less than 1 mm of movement C4 to C6.     PMFS History: Patient Active Problem List   Diagnosis Date Noted   Herniation of cervical intervertebral disc with radiculopathy 08/09/2021    Priority: High   Other spondylosis with radiculopathy, cervical region    S/P cervical spinal fusion 08/09/2021   Chronic low back pain 03/26/2020   Neck pain 03/26/2020   Other chronic pain 03/26/2020   Nodule of upper lobe of right lung 10/08/2019   Head injury 06/04/2019   Post concussion syndrome 06/04/2019   Chronic migraine 06/04/2019   Back pain 06/04/2019   Past Medical History:  Diagnosis Date   Anxiety    Concussion    Dyspnea    with COVID   Migraines    PONV (postoperative nausea and vomiting)    Seasonal allergies     Family History  Problem Relation Age of Onset   Crohn's disease Mother    COPD Father    Hypertension Father    Colon cancer Maternal Grandmother    Heart attack Paternal Grandfather    Colon  cancer Maternal Uncle    Colon polyps Maternal Uncle    Diabetes Maternal Uncle    Hernia Maternal Uncle        hernia attached to intestines   Other Son        pancreas not breaking down food properly   Brain cancer Paternal Uncle    Heart attack Paternal Uncle        x 2    Past Surgical History:  Procedure Laterality Date   ANTERIOR CERVICAL DECOMP/DISCECTOMY FUSION N/A 08/09/2021   Procedure: ANTERIOR CERVICAL DISCECTOMY FUSION C4-5 AND C5-6 WITH PLATE, SCREWS, ALLOGRAFT, LOCAL  BONE GRAFT, VIVIGEN;  Surgeon: Jessy Oto, MD;  Location: [place];  Service: Orthopedics;  Laterality: N/A;   EYELID LACERATION REPAIR Right    Stye removal   Social History   Occupational History   Occupation: Publishing copy: [employer]  Tobacco Use   Smoking status: Former    Types: E-cigarettes    Quit date: 08/2019    Years since quitting: 2.4   Smokeless tobacco: Never  Vaping Use   Vaping Use: Former  Substance and Sexual Activity   Alcohol use: Never   Drug use: Never   Sexual activity: Yes

## 2022-01-27 ENCOUNTER — Encounter: Payer: Self-pay | Admitting: Rehabilitative and Restorative Service Providers"

## 2022-01-27 ENCOUNTER — Ambulatory Visit (INDEPENDENT_AMBULATORY_CARE_PROVIDER_SITE_OTHER): Payer: BC Managed Care – PPO | Admitting: Rehabilitative and Restorative Service Providers"

## 2022-01-27 DIAGNOSIS — R202 Paresthesia of skin: Secondary | ICD-10-CM

## 2022-01-27 DIAGNOSIS — M6281 Muscle weakness (generalized): Secondary | ICD-10-CM

## 2022-01-27 DIAGNOSIS — M542 Cervicalgia: Secondary | ICD-10-CM

## 2022-01-27 DIAGNOSIS — M25532 Pain in left wrist: Secondary | ICD-10-CM | POA: Diagnosis not present

## 2022-01-27 NOTE — Therapy (Signed)
OUTPATIENT OCCUPATIONAL THERAPY TREATMENT & PROGRESS NOTE   Patient Name: Lindsey Graham MRN: [id number] DOB:1975/10/25, 46 y.o., female Today's Date: 01/27/2022  PCP: Eunice Blase, MD REFERRING PROVIDER: Dr. Basil Dess  Progress Note  Reporting Period 12/16/21 to 01/27/22.   See note below for Objective Data and Assessment of Progress/Goals.       OT End of Session - 01/27/22 1019     Visit Number 18    Number of Visits 32    Date for OT Re-Evaluation 03/10/22    Authorization Type BCBS    OT Start Time 1019    OT Stop Time 1100    OT Time Calculation (min) 41 min    Activity Tolerance Patient tolerated treatment well;No increased pain;Patient limited by fatigue    Behavior During Therapy [facility] for tasks assessed/performed              Past Medical History:  Diagnosis Date   Anxiety    Concussion    Dyspnea    with COVID   Migraines    PONV (postoperative nausea and vomiting)    Seasonal allergies    Past Surgical History:  Procedure Laterality Date   ANTERIOR CERVICAL DECOMP/DISCECTOMY FUSION N/A 08/09/2021   Procedure: ANTERIOR CERVICAL DISCECTOMY FUSION C4-5 AND C5-6 WITH PLATE, SCREWS, ALLOGRAFT, LOCAL BONE GRAFT, VIVIGEN;  Surgeon: Jessy Oto, MD;  Location: [place];  Service: Orthopedics;  Laterality: N/A;   EYELID LACERATION REPAIR Right    Stye removal   Patient Active Problem List   Diagnosis Date Noted   Other spondylosis with radiculopathy, cervical region    S/P cervical spinal fusion 08/09/2021   Herniation of cervical intervertebral disc with radiculopathy 08/09/2021   Chronic low back pain 03/26/2020   Neck pain 03/26/2020   Other chronic pain 03/26/2020   Nodule of upper lobe of right lung 10/08/2019   Head injury 06/04/2019   Post concussion syndrome 06/04/2019   Chronic migraine 06/04/2019   Back pain 06/04/2019    ONSET DATE: DOS 08/09/21   REFERRING DIAG:  Z98.1 (ICD-10-CM) - S/P cervical spinal fusion  M25.532 (ICD-10-CM) -  Pain in left wrist    THERAPY DIAG:  Muscle weakness (generalized) - Plan: Ot plan of care cert/re-cert  Pain in left wrist - Plan: Ot plan of care cert/re-cert  Paresthesia of skin - Plan: Ot plan of care cert/re-cert  Cervicalgia - Plan: Ot plan of care cert/re-cert   PERTINENT HISTORY: Per MD: "Left wrist pain dorsal radial likely this is mild chronic sprain, she is post 2 level CDF C4-6 4 months ago. Has fusion by radiographs start cervical spine ROM, McKenzie exercises, H, M and left UE ROM and strengthening"  PRECAUTIONS: now ~19 weeks since cervical sx  SUBJECTIVE:  She states f/u with MD went well, she continues to have some impingement sensation in left scapula/neck, continued ulnar neuropathy symptoms, but these are "better now," she says. She also states able to tolerate and do more at home and getting out of her home more. She sates wanting to continue therapy.    PAIN:  Are you having pain?  Yes  Rating: 4/10 today Lt side of neck (& lesser in Rt side)   OBJECTIVE: (All objective assessments below are from initial evaluation on: 11/07/21 unless otherwise specified.)   HAND DOMINANCE: Right   ADLs: Overall ADLs:  01/27/22: she verbalizes that light home tasks are improving, though she does rate impairment slightly higher per Quick DASH today  Eval: Has been  asking for help carrying things, opening containers, has had pain when pushing with left hand/wrist, also difficulty with IADL work tasks, etc.    FUNCTIONAL OUTCOME MEASURES: Quick Dash: 75% impairment today 11/07/21 12/16/21: Quick Dash: 41% impairment today  01/27/22: 43% impairment rating today   UE ROM: Cervical ROM today 11/07/21: L Rot: 30*, R Rot: 32*, Flexion: 12*, Ext: 10* 11/22/21: L Rot: 29*, R Rot: 27*, Flexion: 28*, Ext: 19*  12/16/21: head/neck AROM today: L Rot: 42*, R Rot: 40*, Flexion: 22*, Ext: 17*  01/27/22: head/neck AROM today: L Rot: 42*, R Rot: 47*, Flexion: 20*, Ext: 19*    Active ROM  Left 11/07/2021 Left 11/22/21 Lt  12/16/21 Rt 12/16/21 Rt   /   LT 01/27/22  Shoulder flexion    156 163 150  /   163  Shoulder abduction    180 180 180   /   180  Shoulder adduction        Shoulder extension        56  /   65  Shoulder internal rotation    33 37   56  /  51*  Shoulder external rotation    95 87 100*  /   100*  Elbow flexion WNL    WFL  Elbow extension WNL    WFL  Wrist flexion 55 63   Lt: 68  Wrist extension 67 70   Lt: 72  (Blank rows = not tested)   UE MMT:  NT at eval but at least 4-/5 MMT though b/l UEs based on observations/other tests     HAND FUNCTION: Grip strength: Right: 65 lbs; Left: 60 pain lbs and Lateral pinch: Right: 13 lbs, Left: 10 painful lbs  12/16/21: Grip strength: Right: 66 lbs; Left: 55  lbs and Lateral pinch: Right: 13 lbs, Left: 12 painful lbs  12/29/21: Lt hand 60# grip today  01/12/22: Right: 80# ; Left: 70#  improved b/l   01/27/22: 73# Rt; 60.7# Lt   COORDINATION: no overt issues, can oppose thumb to all fingers, make full fist, but may need measured due to nerve c/o's   SENSATION: Light touch: WFL WFL in hands now, may need monofilament testing, etc., if symptoms extend into fingers. She does state sensory disturbance roughly through radial nerve distribution in left side of neck, shoulder, arm.   EDEMA: none significant     OBSERVATIONS: Eval: Neck overtly stiff, TTP in distal radial ulnar joint, but not TFCC, not in thumb CMC, she states tightness in webspace,  negative scaphoid shift test.   12/16/21: Wrist still stable, no significant TTP, still somewhat aching at end-ranges; wrist hasn't been a big priority in therapy, though we have discussed stretches and strength with putty, etc.      TODAY'S TREATMENT:  01/27/22: She performs AROM at b/l shoulders in multiple planes as well as left wrist motion, grip training, head and neck motion all with review of HEP and recommendations. OT edu that if her neck is limited to this ROM, she will  need to compensate more with upper body motion and perform more isometric strengthening to work on sore muscles that cannot be moved into adequate stretches. She states understanding and doing HEP as faithfully "as she can."  OT also discusses ulnar neuropathy again, reviews recommendation for straighter elbow in night, avoid pressure at medial elbow, doing nerve glide exercises, etc. She states forgetting about these recommendations. OT also edu on importance of staying asymptomatic while trying to  progress functional ability and do home and self-care tasks.  At end, OT does manual therapy to still sore, spasm areas in neck/shoulder left side. She states this "always helps" with symptoms.     01/19/22: We continue with [place] tx to left side of scap today (100% continuous, 1MHz, 0.7 w/cm^2, [place] 8 mins) & manual therapy to palpable muscle spasms in left levator scap and rear trap/dept area- these knots feel much better today and less TTP, etc. Very positive.  She also seems to tolerate functional endurance activities much better today, despite more resistance.   UBE for 5 mins (@ 50 RPM and working up to 5.0 resistance today fnl push  10# x15, 15# x10 pull with 15# x15, 20# x10 Neck ext isometrics x3 b/l with 5 sec holds   PATIENT EDUCATION: Education details: see tx section above for details  Person educated: Patient Education method: Explanation, Demonstration, and Handouts Education comprehension: verbalized understanding, returned demonstration, and needs further education     HOME EXERCISE PROGRAM: Access Code: [email or web address] URL: [email or web address] Prepared by: Benito Mccreedy     GOALS: Goals reviewed with patient? Yes   SHORT TERM GOALS: (STG required if POC>30 days)   Pt will demo/state understanding of initial HEP to improve pain levels and prerequisite motion. Target date: 11/17/21 Goal status: MET 11/17/21     LONG TERM GOALS:   Pt will improve functional ability by  decreased impairment per Quick DASH assessment from 75% to 15% or better, for better quality of life. Target date: 03/10/22 Goal status: 01/27/22: Progressing- she rates slightly higher today than last check (43%), but this may be due to her trying to do more activities that she had been withholding.    2.  Pt will improve grip strength in b/l grip from 65Rt/60Lt lbs to at least 75Rt/70Lt lbs for functional use in work/factory IADLs. Target date: 03/10/22 Goal status: 01/27/22: Progressing- now 73# right, left wrist/and still bothers her and is still about 60#   3.  Pt will improve A/ROM in cervical flexion & ext from ~10* each to at least 60*/30* respectively, to have functional motion for tasks like driving, dressing, social participation with friends/family.  Target date: 01/27/22 Goal status: 01/27/22: Not Met- she measures 20* flexion and 19* ext today. This may be nearing maximal for her after fusion, as she has seemed to plateau somewhat. 60* flexion was likely too ambitious    4.  Pt will improve strength in b/l shoulders from 4-/5 MMT to at least 4+/5 MMT (no pain) to have increased functional ability to carry out selfcare and higher-level homecare tasks with no difficulty. Target date: 12/16/21 Goal status: 12/16/21: MET 4+/5 now b/l sh scaption, but needs to continue working on endurance at this level now   5.  Pt will decrease pain at worst from "Moderate" to 2/10 or better to have better sleep and occupational participation in daily roles. Target date: 03/10/22 Goal status: 01/27/22: Progressing, Her pain remains around 4/10 with less exacerbations now  6. Pt will push/pull up to 40# with no problems/pain to return to work activities. Target date: 03/10/22 Goal status: 01/27/22: improving, now tolerating push/pull up to 20# with no increase in symptoms.      ASSESSMENT:   CLINICAL IMPRESSION: 01/27/22: Shoulder, wrist motions improved, neck motion not significantly improved and may be  plateauing. Grip and tolerance to resistance improving. MD is still recommending to limit weight/lifting, and will keep her out of work due to heavy work demands.  OT will continue to progress these things as tolerated, as objectively she has been performing better in sessions with less pain, fatigue, etc., despite her motion starting to plateau in neck ROM.     PLAN: OT FREQUENCY: additional 8 visits; 1- 2x/week   OT DURATION: additional 6 weeks (through 03/10/22)   PLANNED INTERVENTIONS: self care/ADL training, therapeutic exercise, therapeutic activity, manual therapy, scar mobilization, passive range of motion, splinting, ultrasound, fluidotherapy, moist heat, cryotherapy, patient/family education, and coping strategies training   RECOMMENDED OTHER SERVICES: none now   CONSULTED AND AGREED WITH PLAN OF CARE: Patient   PLAN FOR NEXT SESSION:  Continue working on functional activities, endurance and tolerance to daily activities and work tasks   [employer], OTR/L, CHT 01/27/2022, 12:27 PM

## 2022-02-02 ENCOUNTER — Encounter: Payer: Self-pay | Admitting: Rehabilitative and Restorative Service Providers"

## 2022-02-02 ENCOUNTER — Ambulatory Visit (INDEPENDENT_AMBULATORY_CARE_PROVIDER_SITE_OTHER): Payer: BC Managed Care – PPO | Admitting: Rehabilitative and Restorative Service Providers"

## 2022-02-02 DIAGNOSIS — M25532 Pain in left wrist: Secondary | ICD-10-CM

## 2022-02-02 DIAGNOSIS — M542 Cervicalgia: Secondary | ICD-10-CM | POA: Diagnosis not present

## 2022-02-02 DIAGNOSIS — M6281 Muscle weakness (generalized): Secondary | ICD-10-CM | POA: Diagnosis not present

## 2022-02-02 DIAGNOSIS — R202 Paresthesia of skin: Secondary | ICD-10-CM

## 2022-02-02 NOTE — Therapy (Signed)
OUTPATIENT OCCUPATIONAL THERAPY TREATMENT NOTE   Patient Name: Lindsey Graham MRN: [id number] DOB:1976/01/22, 46 y.o., female Today's Date: 02/02/2022  PCP: Eunice Blase, MD REFERRING PROVIDER: Dr. Basil Dess       OT End of Session - 02/02/22 1150     Visit Number 19    Number of Visits 32    Date for OT Re-Evaluation 03/10/22    Authorization Type BCBS    OT Start Time 1150    OT Stop Time 1230    OT Time Calculation (min) 40 min    Activity Tolerance Patient tolerated treatment well;No increased pain;Patient limited by fatigue    Behavior During Therapy [facility] for tasks assessed/performed               Past Medical History:  Diagnosis Date   Anxiety    Concussion    Dyspnea    with COVID   Migraines    PONV (postoperative nausea and vomiting)    Seasonal allergies    Past Surgical History:  Procedure Laterality Date   ANTERIOR CERVICAL DECOMP/DISCECTOMY FUSION N/A 08/09/2021   Procedure: ANTERIOR CERVICAL DISCECTOMY FUSION C4-5 AND C5-6 WITH PLATE, SCREWS, ALLOGRAFT, LOCAL BONE GRAFT, VIVIGEN;  Surgeon: Jessy Oto, MD;  Location: [place];  Service: Orthopedics;  Laterality: N/A;   EYELID LACERATION REPAIR Right    Stye removal   Patient Active Problem List   Diagnosis Date Noted   Other spondylosis with radiculopathy, cervical region    S/P cervical spinal fusion 08/09/2021   Herniation of cervical intervertebral disc with radiculopathy 08/09/2021   Chronic low back pain 03/26/2020   Neck pain 03/26/2020   Other chronic pain 03/26/2020   Nodule of upper lobe of right lung 10/08/2019   Head injury 06/04/2019   Post concussion syndrome 06/04/2019   Chronic migraine 06/04/2019   Back pain 06/04/2019    ONSET DATE: DOS 08/09/21   REFERRING DIAG:  Z98.1 (ICD-10-CM) - S/P cervical spinal fusion  M25.532 (ICD-10-CM) - Pain in left wrist    THERAPY DIAG:  Pain in left wrist  Muscle weakness (generalized)  Paresthesia of  skin  Cervicalgia   PERTINENT HISTORY: Per MD: "Left wrist pain dorsal radial likely this is mild chronic sprain, she is post 2 level CDF C4-6 4 months ago. Has fusion by radiographs start cervical spine ROM, McKenzie exercises, H, M and left UE ROM and strengthening"  PRECAUTIONS: now ~19 weeks since cervical sx  SUBJECTIVE:  She states possibly sleeping "wrong" last night but having some increased pain, although she was "good," before that. He been training at home as tolerated.   PAIN:  Are you having pain?  Yes  Rating: 5/10 today Lt side of neck (& lesser in Rt side)   OBJECTIVE: (All objective assessments below are from initial evaluation on: 11/07/21 unless otherwise specified.)   HAND DOMINANCE: Right   ADLs: Overall ADLs:  01/27/22: she verbalizes that light home tasks are improving, though she does rate impairment slightly higher per Quick DASH today  Eval: Has been asking for help carrying things, opening containers, has had pain when pushing with left hand/wrist, also difficulty with IADL work tasks, etc.    FUNCTIONAL OUTCOME MEASURES: Quick Dash: 75% impairment today 11/07/21 12/16/21: Quick Dash: 41% impairment today  01/27/22: 43% impairment rating today   UE ROM: Cervical ROM today 11/07/21: L Rot: 30*, R Rot: 32*, Flexion: 12*, Ext: 10* 11/22/21: L Rot: 29*, R Rot: 27*, Flexion: 28*, Ext: 19*  12/16/21: head/neck  AROM today: L Rot: 42*, R Rot: 40*, Flexion: 22*, Ext: 17*  01/27/22: head/neck AROM today: L Rot: 42*, R Rot: 47*, Flexion: 20*, Ext: 19*    Active ROM Left 11/07/2021 Left 11/22/21 Lt  12/16/21 Rt 12/16/21 Rt   /   LT 01/27/22  Shoulder flexion    156 163 150  /   163  Shoulder abduction    180 180 180   /   180  Shoulder adduction        Shoulder extension        56  /   65  Shoulder internal rotation    33 37   56  /  51*  Shoulder external rotation    95 87 100*  /   100*  Elbow flexion WNL    WFL  Elbow extension WNL    WFL  Wrist flexion 55 63    Lt: 68  Wrist extension 67 70   Lt: 72  (Blank rows = not tested)   UE MMT:  NT at eval but at least 4-/5 MMT though b/l UEs based on observations/other tests     HAND FUNCTION: Grip strength: Right: 65 lbs; Left: 60 pain lbs and Lateral pinch: Right: 13 lbs, Left: 10 painful lbs  12/16/21: Grip strength: Right: 66 lbs; Left: 55  lbs and Lateral pinch: Right: 13 lbs, Left: 12 painful lbs  12/29/21: Lt hand 60# grip today  01/12/22: Right: 80# ; Left: 70#  improved b/l   01/27/22: 73# Rt; 60.7# Lt   COORDINATION: no overt issues, can oppose thumb to all fingers, make full fist, but may need measured due to nerve c/o's   SENSATION: Light touch: WFL WFL in hands now, may need monofilament testing, etc., if symptoms extend into fingers. She does state sensory disturbance roughly through radial nerve distribution in left side of neck, shoulder, arm.   EDEMA: none significant     OBSERVATIONS: Eval: Neck overtly stiff, TTP in distal radial ulnar joint, but not TFCC, not in thumb CMC, she states tightness in webspace,  negative scaphoid shift test.   12/16/21: Wrist still stable, no significant TTP, still somewhat aching at end-ranges; wrist hasn't been a big priority in therapy, though we have discussed stretches and strength with putty, etc.      TODAY'S TREATMENT:  02/02/22: OT starts with 6 mins [place] followed by manual IASTM to left side of neck and scapula for muscle knots there. She states feeling a bit better, then reviews shoulder and neck stretches with OT, focusing on the tight neck, shoulder, scapular areas.  She does the below for:   UBE for 5 mins (@ 50 RPM and working up to 4.0 resistance today fnl push  15# x15 pull with  20# x15 Neck ext isometrics x3 b/l with 5 sec holds  Hands and knees with shoulder reaching (bird dog) 2 x 10 Functional reaching (into cabinets and low shelves) with 3# x10  She states feeling no major pain at end, states feeling looser and better than start  of session   01/27/22: She performs AROM at b/l shoulders in multiple planes as well as left wrist motion, grip training, head and neck motion all with review of HEP and recommendations. OT edu that if her neck is limited to this ROM, she will need to compensate more with upper body motion and perform more isometric strengthening to work on sore muscles that cannot be moved into adequate stretches. She states understanding  and doing HEP as faithfully "as she can."  OT also discusses ulnar neuropathy again, reviews recommendation for straighter elbow in night, avoid pressure at medial elbow, doing nerve glide exercises, etc. She states forgetting about these recommendations. OT also edu on importance of staying asymptomatic while trying to progress functional ability and do home and self-care tasks.  At end, OT does manual therapy to still sore, spasm areas in neck/shoulder left side. She states this "always helps" with symptoms.     01/19/22: We continue with [place] tx to left side of scap today (100% continuous, 1MHz, 0.7 w/cm^2, [place] 8 mins) & manual therapy to palpable muscle spasms in left levator scap and rear trap/dept area- these knots feel much better today and less TTP, etc. Very positive.  She also seems to tolerate functional endurance activities much better today, despite more resistance.   UBE for 5 mins (@ 50 RPM and working up to 5.0 resistance today fnl push  10# x15, 15# x10 pull with 15# x15, 20# x10 Neck ext isometrics x3 b/l with 5 sec holds   PATIENT EDUCATION: Education details: see tx section above for details  Person educated: Patient Education method: Explanation, Demonstration, and Handouts Education comprehension: verbalized understanding, returned demonstration, and needs further education     HOME EXERCISE PROGRAM: Access Code: [email or web address] URL: [email or web address] Prepared by: Benito Mccreedy     GOALS: Goals reviewed with patient? Yes   SHORT TERM GOALS:  (STG required if POC>30 days)   Pt will demo/state understanding of initial HEP to improve pain levels and prerequisite motion. Target date: 11/17/21 Goal status: MET 11/17/21     LONG TERM GOALS:   Pt will improve functional ability by decreased impairment per Quick DASH assessment from 75% to 15% or better, for better quality of life. Target date: 03/10/22 Goal status: 01/27/22: Progressing- she rates slightly higher today than last check (43%), but this may be due to her trying to do more activities that she had been withholding.    2.  Pt will improve grip strength in b/l grip from 65Rt/60Lt lbs to at least 75Rt/70Lt lbs for functional use in work/factory IADLs. Target date: 03/10/22 Goal status: 01/27/22: Progressing- now 73# right, left wrist/and still bothers her and is still about 60#   3.  Pt will improve A/ROM in cervical flexion & ext from ~10* each to at least 60*/30* respectively, to have functional motion for tasks like driving, dressing, social participation with friends/family.  Target date: 01/27/22 Goal status: 01/27/22: Not Met- she measures 20* flexion and 19* ext today. This may be nearing maximal for her after fusion, as she has seemed to plateau somewhat. 60* flexion was likely too ambitious    4.  Pt will improve strength in b/l shoulders from 4-/5 MMT to at least 4+/5 MMT (no pain) to have increased functional ability to carry out selfcare and higher-level homecare tasks with no difficulty. Target date: 12/16/21 Goal status: 12/16/21: MET 4+/5 now b/l sh scaption, but needs to continue working on endurance at this level now   5.  Pt will decrease pain at worst from "Moderate" to 2/10 or better to have better sleep and occupational participation in daily roles. Target date: 03/10/22 Goal status: 01/27/22: Progressing, Her pain remains around 4/10 with less exacerbations now  6. Pt will push/pull up to 40# with no problems/pain to return to work activities. Target date:  03/10/22 Goal status: 01/27/22: improving, now tolerating push/pull up to 20# with no increase in symptoms.  ASSESSMENT:   CLINICAL IMPRESSION: 02/02/22: Improving tolerance, strength, etc. Still having pain reports and muscular spasms, likely body still accommodating to new loss of motion with fusion.   01/27/22: Shoulder, wrist motions improved, neck motion not significantly improved and may be plateauing. Grip and tolerance to resistance improving. MD is still recommending to limit weight/lifting, and will keep her out of work due to heavy work demands. OT will continue to progress these things as tolerated, as objectively she has been performing better in sessions with less pain, fatigue, etc., despite her motion starting to plateau in neck ROM.     PLAN: OT FREQUENCY: additional 8 visits; 1- 2x/week   OT DURATION: additional 6 weeks (through 03/10/22)   PLANNED INTERVENTIONS: self care/ADL training, therapeutic exercise, therapeutic activity, manual therapy, scar mobilization, passive range of motion, splinting, ultrasound, fluidotherapy, moist heat, cryotherapy, patient/family education, and coping strategies training   RECOMMENDED OTHER SERVICES: none now   CONSULTED AND AGREED WITH PLAN OF CARE: Patient   PLAN FOR NEXT SESSION:  Continue working on functional activities, endurance and tolerance to daily activities and work tasks   [employer], OTR/L, CHT 02/02/2022, 12:34 PM

## 2022-02-09 ENCOUNTER — Encounter: Payer: Self-pay | Admitting: Rehabilitative and Restorative Service Providers"

## 2022-02-09 ENCOUNTER — Ambulatory Visit (INDEPENDENT_AMBULATORY_CARE_PROVIDER_SITE_OTHER): Payer: BC Managed Care – PPO | Admitting: Rehabilitative and Restorative Service Providers"

## 2022-02-09 DIAGNOSIS — M6281 Muscle weakness (generalized): Secondary | ICD-10-CM | POA: Diagnosis not present

## 2022-02-09 DIAGNOSIS — M542 Cervicalgia: Secondary | ICD-10-CM | POA: Diagnosis not present

## 2022-02-09 DIAGNOSIS — M25532 Pain in left wrist: Secondary | ICD-10-CM

## 2022-02-09 DIAGNOSIS — R202 Paresthesia of skin: Secondary | ICD-10-CM

## 2022-02-09 NOTE — Therapy (Signed)
OUTPATIENT OCCUPATIONAL THERAPY TREATMENT NOTE   Patient Name: Lindsey Graham MRN: [id number] DOB:[birth year], 46 y.o., female Today's Date: 02/09/2022  PCP: Eunice Blase, MD REFERRING PROVIDER: Dr. Basil Dess       OT End of Session - 02/09/22 1146     Visit Number 20    Number of Visits 32    Date for OT Re-Evaluation 03/10/22    Authorization Type BC[employer]    OT Start Time 1146    OT Stop Time 1233    OT Time Calculation (min) 47 min    Activity Tolerance Patient tolerated treatment well;No increased pain;Patient limited by fatigue    Behavior During Therapy [facility] for tasks assessed/performed             Past Medical History:  Diagnosis Date   Anxiety    Concussion    Dyspnea    with COVID   Migraines    PONV (postoperative nausea and vomiting)    Seasonal allergies    Past Surgical History:  Procedure Laterality Date   ANTERIOR CERVICAL DECOMP/DISCECTOMY FUSION N/A 08/09/2021   Procedure: ANTERIOR CERVICAL DISCECTOMY FUSION C4-5 AND C5-6 WITH PLATE, SCREWS, ALLOGRAFT, LOCAL BONE GRAFT, VIVIGEN;  Surgeon: Jessy Oto, MD;  Location: [place];  Service: Orthopedics;  Laterality: N/A;   EYELID LACERATION REPAIR Right    Stye removal   Patient Active Problem List   Diagnosis Date Noted   Other spondylosis with radiculopathy, cervical region    S/P cervical spinal fusion 08/09/2021   Herniation of cervical intervertebral disc with radiculopathy 08/09/2021   Chronic low back pain 03/26/2020   Neck pain 03/26/2020   Other chronic pain 03/26/2020   Nodule of upper lobe of right lung 10/08/2019   Head injury 06/04/2019   Post concussion syndrome 06/04/2019   Chronic migraine 06/04/2019   Back pain 06/04/2019    ONSET DATE: DOS 08/09/21   REFERRING DIAG:  Z98.1 (ICD-10-CM) - S/P cervical spinal fusion  M25.532 (ICD-10-CM) - Pain in left wrist    THERAPY DIAG:  Pain in left wrist  Muscle weakness (generalized)  Paresthesia of  skin  Cervicalgia   PERTINENT HISTORY: Per MD: "Left wrist pain dorsal radial likely this is mild chronic sprain, she is post 2 level CDF C4-6 4 months ago. Has fusion by radiographs start cervical spine ROM, McKenzie exercises, H, M and left UE ROM and strengthening"  PRECAUTIONS: now ~20 weeks since cervical sx  SUBJECTIVE:  She states feeling slightly better than last week but still unsure if she is sleeping wrong or if it's her neck pinching a nerve or what. OT encourage her to keep steady with frequent light stretches at head/neck not to overdo resistance there.    PAIN:  Are you having pain?  Yes  Rating: 5/10 today Lt side of neck (& lesser in Rt side)   OBJECTIVE: (All objective assessments below are from initial evaluation on: 11/07/21 unless otherwise specified.)   HAND DOMINANCE: Right   ADLs: Overall ADLs:  01/27/22: she verbalizes that light home tasks are improving, though she does rate impairment slightly higher per Quick DASH today  Eval: Has been asking for help carrying things, opening containers, has had pain when pushing with left hand/wrist, also difficulty with IADL work tasks, etc.    FUNCTIONAL OUTCOME MEASURES: Quick Dash: 75% impairment today 11/07/21 12/16/21: Quick Dash: 41% impairment today  01/27/22: 43% impairment rating today   UE ROM: Cervical ROM today 11/07/21: L Rot: 30*, R Rot: 32*, Flexion:  12*, Ext: 10* 11/22/21: L Rot: 29*, R Rot: 27*, Flexion: 28*, Ext: 19*  12/16/21: head/neck AROM today: L Rot: 42*, R Rot: 40*, Flexion: 22*, Ext: 17*  01/27/22: head/neck AROM today: L Rot: 42*, R Rot: 47*, Flexion: 20*, Ext: 19*    Active ROM Left 11/07/2021 Left 11/22/21 Lt  12/16/21 Rt 12/16/21 Rt   /   LT 01/27/22  Shoulder flexion    156 163 150  /   163  Shoulder abduction    180 180 180   /   180  Shoulder adduction        Shoulder extension        56  /   65  Shoulder internal rotation    33 37   56  /  51*  Shoulder external rotation    95 87 100*   /   100*  Elbow flexion WNL    WFL  Elbow extension WNL    WFL  Wrist flexion 55 63   Lt: 68  Wrist extension 67 70   Lt: 72  (Blank rows = not tested)   UE MMT:  NT at eval but at least 4-/5 MMT though b/l UEs based on observations/other tests     HAND FUNCTION: Grip strength: Right: 65 lbs; Left: 60 pain lbs and Lateral pinch: Right: 13 lbs, Left: 10 painful lbs  12/16/21: Grip strength: Right: 66 lbs; Left: 55  lbs and Lateral pinch: Right: 13 lbs, Left: 12 painful lbs  12/29/21: Lt hand 60# grip today  01/12/22: Right: 80# ; Left: 70#  improved b/l   01/27/22: 73# Rt; 60.7# Lt   COORDINATION: no overt issues, can oppose thumb to all fingers, make full fist, but may need measured due to nerve c/o's   SENSATION: Light touch: WFL WFL in hands now, may need monofilament testing, etc., if symptoms extend into fingers. She does state sensory disturbance roughly through radial nerve distribution in left side of neck, shoulder, arm.   EDEMA: none significant     OBSERVATIONS: Eval: Neck overtly stiff, TTP in distal radial ulnar joint, but not TFCC, not in thumb CMC, she states tightness in webspace,  negative scaphoid shift test.   12/16/21: Wrist still stable, no significant TTP, still somewhat aching at end-ranges; wrist hasn't been a big priority in therapy, though we have discussed stretches and strength with putty, etc.      TODAY'S TREATMENT:  02/09/22:  OT starts with manual MFR and trigger point release to b/l upper traps/lev scap (trying to wean need/reliance on [place] therapy). OT also applies K-tape in star pattern over left upper trap trigger point area (near where it "pinches") and she states "maybe" feeling some relief there. She then begins neck/shoulder stretches with supine scap protraction, shoulder IR, flexion very slowly with light tension. We will try longer stretches (she is edu to do 2-3 x for 45 + sec now at home) to see if that allows for her tightness to accommodate. She  tolerates all very well but does state some burning sensation after neck isometrics in ext and lateral ext b/l as below. She also trials functional squat/reach/upward reach dynamic activity today. She states her knee start popping, so OT guided her through knee stretches and she is able to finish the activity.    Neck ext isometrics in ext and lateral flexion x3 each b/l with 10 sec holds  Lift object from floor to overhead: 2# x5 b/l (rest & knee stretches) 2#  x5 b/l    02/02/22: OT starts with 6 mins [place] followed by manual IASTM to left side of neck and scapula for muscle knots there. She states feeling a bit better, then reviews shoulder and neck stretches with OT, focusing on the tight neck, shoulder, scapular areas.  She does the below for:   UBE for 5 mins (@ 50 RPM and working up to 4.0 resistance today fnl push  15# x15 pull with  20# x15 Neck ext isometrics x3 b/l with 5 sec holds  Hands and knees with shoulder reaching (bird dog) 2 x 10 Functional reaching (into cabinets and low shelves) with 3# x10  She states feeling no major pain at end, states feeling looser and better than start of session   01/27/22: She performs AROM at b/l shoulders in multiple planes as well as left wrist motion, grip training, head and neck motion all with review of HEP and recommendations. OT edu that if her neck is limited to this ROM, she will need to compensate more with upper body motion and perform more isometric strengthening to work on sore muscles that cannot be moved into adequate stretches. She states understanding and doing HEP as faithfully "as she can."  OT also discusses ulnar neuropathy again, reviews recommendation for straighter elbow in night, avoid pressure at medial elbow, doing nerve glide exercises, etc. She states forgetting about these recommendations. OT also edu on importance of staying asymptomatic while trying to progress functional ability and do home and self-care tasks.  At end, OT  does manual therapy to still sore, spasm areas in neck/shoulder left side. She states this "always helps" with symptoms.     01/19/22: We continue with [place] tx to left side of scap today (100% continuous, 1MHz, 0.7 w/cm^2, [place] 8 mins) & manual therapy to palpable muscle spasms in left levator scap and rear trap/dept area- these knots feel much better today and less TTP, etc. Very positive.  She also seems to tolerate functional endurance activities much better today, despite more resistance.   UBE for 5 mins (@ 50 RPM and working up to 5.0 resistance today fnl push  10# x15, 15# x10 pull with 15# x15, 20# x10 Neck ext isometrics x3 b/l with 5 sec holds   PATIENT EDUCATION: Education details: see tx section above for details  Person educated: Patient Education method: Explanation, Demonstration, and Handouts Education comprehension: verbalized understanding, returned demonstration, and needs further education     HOME EXERCISE PROGRAM: Access Code: [email or web address] URL: [email or web address] Prepared by: Benito Mccreedy     GOALS: Goals reviewed with patient? Yes   SHORT TERM GOALS: (STG required if POC>30 days)   Pt will demo/state understanding of initial HEP to improve pain levels and prerequisite motion. Target date: 11/17/21 Goal status: MET 11/17/21     LONG TERM GOALS:   Pt will improve functional ability by decreased impairment per Quick DASH assessment from 75% to 15% or better, for better quality of life. Target date: 03/10/22 Goal status: 01/27/22: Progressing- she rates slightly higher today than last check (43%), but this may be due to her trying to do more activities that she had been withholding.    2.  Pt will improve grip strength in b/l grip from 65Rt/60Lt lbs to at least 75Rt/70Lt lbs for functional use in work/factory IADLs. Target date: 03/10/22 Goal status: 01/27/22: Progressing- now 73# right, left wrist/and still bothers her and is still about 60#   3.  Pt  will improve A/ROM  in cervical flexion & ext from ~10* each to at least 60*/30* respectively, to have functional motion for tasks like driving, dressing, social participation with friends/family.  Target date: 01/27/22 Goal status: 01/27/22: Not Met- she measures 20* flexion and 19* ext today. This may be nearing maximal for her after fusion, as she has seemed to plateau somewhat. 60* flexion was likely too ambitious    4.  Pt will improve strength in b/l shoulders from 4-/5 MMT to at least 4+/5 MMT (no pain) to have increased functional ability to carry out selfcare and higher-level homecare tasks with no difficulty. Target date: 12/16/21 Goal status: 12/16/21: MET 4+/5 now b/l sh scaption, but needs to continue working on endurance at this level now   5.  Pt will decrease pain at worst from "Moderate" to 2/10 or better to have better sleep and occupational participation in daily roles. Target date: 03/10/22 Goal status: 01/27/22: Progressing, Her pain remains around 4/10 with less exacerbations now  6. Pt will push/pull up to 40# with no problems/pain to return to work activities. Target date: 03/10/22 Goal status: 01/27/22: improving, now tolerating push/pull up to 20# with no increase in symptoms.      ASSESSMENT:   CLINICAL IMPRESSION: 02/09/22: She continues to state "pinching, burning and nerve type pains at times, yet also seems to be doing more dynamic and functional activities with better tolerance now. She states not wanting further sx, but OT is concerned that we are having a limited effect due to reports of pain/nerve pinching that started post-op after she states feeling a pop in neck\shoulder when weight shifting in recliner.   02/02/22: Improving tolerance, strength, etc. Still having pain reports and muscular spasms, likely body still accommodating to new loss of motion with fusion.   01/27/22: Shoulder, wrist motions improved, neck motion not significantly improved and may be plateauing.  Grip and tolerance to resistance improving. MD is still recommending to limit weight/lifting, and will keep her out of work due to heavy work demands. OT will continue to progress these things as tolerated, as objectively she has been performing better in sessions with less pain, fatigue, etc., despite her motion starting to plateau in neck ROM.     PLAN: OT FREQUENCY: additional 8 visits; 1- 2x/week   OT DURATION: additional 6 weeks (through 03/10/22)   PLANNED INTERVENTIONS: self care/ADL training, therapeutic exercise, therapeutic activity, manual therapy, scar mobilization, passive range of motion, splinting, ultrasound, fluidotherapy, moist heat, cryotherapy, patient/family education, and coping strategies training   RECOMMENDED OTHER SERVICES: none now   CONSULTED AND AGREED WITH PLAN OF CARE: Patient   PLAN FOR NEXT SESSION:  Continue working toward goals: grip, push/pull, functional tasks, reducing pain, etc. Continue encouraging longer more frequent stretches and isometrics (though done lightly, non-painfully)   Benito Mccreedy, OTR/L, CHT 02/09/2022, 12:45 PM

## 2022-02-16 ENCOUNTER — Ambulatory Visit (INDEPENDENT_AMBULATORY_CARE_PROVIDER_SITE_OTHER): Payer: BC Managed Care – PPO | Admitting: Rehabilitative and Restorative Service Providers"

## 2022-02-16 ENCOUNTER — Encounter: Payer: Self-pay | Admitting: Rehabilitative and Restorative Service Providers"

## 2022-02-16 DIAGNOSIS — M542 Cervicalgia: Secondary | ICD-10-CM

## 2022-02-16 DIAGNOSIS — M25532 Pain in left wrist: Secondary | ICD-10-CM | POA: Diagnosis not present

## 2022-02-16 DIAGNOSIS — M6281 Muscle weakness (generalized): Secondary | ICD-10-CM | POA: Diagnosis not present

## 2022-02-16 DIAGNOSIS — R202 Paresthesia of skin: Secondary | ICD-10-CM
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
## 2022-02-21 ENCOUNTER — Encounter: Payer: BC Managed Care – PPO | Admitting: Rehabilitative and Restorative Service Providers"

## 2022-02-23 ENCOUNTER — Ambulatory Visit (INDEPENDENT_AMBULATORY_CARE_PROVIDER_SITE_OTHER): Payer: BC Managed Care – PPO | Admitting: Rehabilitative and Restorative Service Providers"

## 2022-02-23 ENCOUNTER — Encounter: Payer: Self-pay | Admitting: Rehabilitative and Restorative Service Providers"

## 2022-02-23 DIAGNOSIS — M6281 Muscle weakness (generalized): Secondary | ICD-10-CM

## 2022-02-23 DIAGNOSIS — M25532 Pain in left wrist: Secondary | ICD-10-CM | POA: Diagnosis not present

## 2022-02-23 DIAGNOSIS — R202 Paresthesia of skin: Secondary | ICD-10-CM

## 2022-02-23 DIAGNOSIS — M542 Cervicalgia: Secondary | ICD-10-CM

## 2022-02-23 NOTE — Therapy (Signed)
OUTPATIENT OCCUPATIONAL THERAPY TREATMENT & DISCHARGE NOTE   Patient Name: Lindsey Graham MRN: [id number] DOB:[birth year], 46 y.o., female Today's Date: 02/23/2022  PCP: Eunice Blase, MD REFERRING PROVIDER: Dr. Basil Dess       OT End of Session - 02/23/22 1311     Visit Number 22    Number of Visits 32    Date for OT Re-Evaluation 03/10/22    Authorization Type BCBS    OT Start Time 4580    OT Stop Time 1354    OT Time Calculation (min) 47 min    Activity Tolerance Patient tolerated treatment well;No increased pain;Patient limited by fatigue;Patient limited by pain    Behavior During Therapy [facility] for tasks assessed/performed               Past Medical History:  Diagnosis Date   Anxiety    Concussion    Dyspnea    with COVID   Migraines    PONV (postoperative nausea and vomiting)    Seasonal allergies    Past Surgical History:  Procedure Laterality Date   ANTERIOR CERVICAL DECOMP/DISCECTOMY FUSION N/A 08/09/2021   Procedure: ANTERIOR CERVICAL DISCECTOMY FUSION C4-5 AND C5-6 WITH PLATE, SCREWS, ALLOGRAFT, LOCAL BONE GRAFT, VIVIGEN;  Surgeon: Jessy Oto, MD;  Location: [place];  Service: Orthopedics;  Laterality: N/A;   EYELID LACERATION REPAIR Right    Stye removal   Patient Active Problem List   Diagnosis Date Noted   Other spondylosis with radiculopathy, cervical region    S/P cervical spinal fusion 08/09/2021   Herniation of cervical intervertebral disc with radiculopathy 08/09/2021   Chronic low back pain 03/26/2020   Neck pain 03/26/2020   Other chronic pain 03/26/2020   Nodule of upper lobe of right lung 10/08/2019   Head injury 06/04/2019   Post concussion syndrome 06/04/2019   Chronic migraine 06/04/2019   Back pain 06/04/2019    ONSET DATE: DOS 08/09/21   REFERRING DIAG:  Z98.1 (ICD-10-CM) - S/P cervical spinal fusion  M25.532 (ICD-10-CM) - Pain in left wrist    THERAPY DIAG:  Pain in left wrist  Muscle weakness  (generalized)  Paresthesia of skin  Cervicalgia   PERTINENT HISTORY: Per MD: "Left wrist pain dorsal radial likely this is mild chronic sprain, she is post 2 level CDF C4-6 4 months ago. Has fusion by radiographs start cervical spine ROM, McKenzie exercises, H, M and left UE ROM and strengthening"  PRECAUTIONS: now 5+ months since cervical sx  SUBJECTIVE:  She states feeling better since surgery, and even though she has pinching nerve sensation, chronic pain., she is also doing more functional activities and tolerating more at this point.    PAIN:  Are you having pain?  Yes  Rating: 4/10 today Lt side of neck (& lesser in Rt side)   OBJECTIVE: (All objective assessments below are from initial evaluation on: 11/07/21 unless otherwise specified.)   HAND DOMINANCE: Right   ADLs: Overall ADLs:  02/23/22: Still having "moderate" issues with most home tasks, but BADLs are completely independent    FUNCTIONAL OUTCOME MEASURES: 02/23/22: Mikael Spray Dash: 47%% impairment today  Eval: Quick Dash: 75% impairment today 11/07/21    UE ROM: 02/23/22: Cervical ROM today: L Rot: 38*, R Rot: 42*, Flexion: 20*, Ext: 17*  Eval: Cervical ROM today 11/07/21: L Rot: 30*, R Rot: 32*, Flexion: 12*, Ext: 10*    Active ROM Left 11/07/2021 Lt  12/16/21 Rt   /   LT 01/27/22 Rt       /  LT 02/23/22  Shoulder flexion   156 150  /   163 168     /    162  Shoulder abduction   180 180   /   180 168     /    163  Shoulder extension      56  /   65 66     /    47  Shoulder internal rotation   33   56  /  51* 49     /    41  Shoulder external rotation   95 100*  /   100* 82     /    95  Elbow flexion WNL  [facility] WFL b/l  Elbow extension WNL  [facility] WFL b/l  Wrist flexion 55  Lt: 68 Lt: 69  Wrist extension 67  Lt: 72 Lt: 67  (Blank rows = not tested)   UE MMT:   02/23/22:   Rt sh flex: 4+/5 MMT; Lt sh flex: 4/5 MMT;  Rt bicep   5/5 MMT;    Lt bicep 4+/5 MMT;  Rt wrist ext: 5/5 MMT; Lt wrist ext 4+/5 MMT       HAND FUNCTION: 02/23/22: Grip strength: Right: 80 lbs; Left: 75 pain lbs and Lateral pinch: Right: 14 lbs, Left: 14 painful lbs  Eval: Grip strength: Right: 65 lbs; Left: 60 pain lbs and Lateral pinch: Right: 13 lbs, Left: 10 painful lbs    COORDINATION:  02/23/22: FMS WFL, gross coordination through upper body also observed WFL through dynamic activities today- reaching catching, throwing, overhead and about body.   9HPT FMS coordination Rt:  23.3sec Lt: 24.6sec  (WFL b/l, but below average)    SENSATION: 02/23/22: Light touch: intact, she still states some radiating numbness down left arm form "pinch in neck," but this is better now, less intense and less often   EDEMA: none significant     OBSERVATIONS:  02/23/22: She has no overt pain signs with testing today, less pain posturing, is more comfortable with exercises and self-management     TODAY'S TREATMENT:  02/23/22:  She does AROM of head/neck, and b/l UEs for reassessment measures. She also does functional gripping, and coordination tasks, and despite showing minimal issues now, still states having pain and doesn't improve much in terms of motion and strength scores now. She also continues to state similar "moderate" issues at home due to pain and stiffness in neck and other chronic comorbidities. OT reviews all recommendations for HEP performance (continued daily management of stiffness/tightness/nerve complaints through daily routines and stretches, etc), being as active as possible at home and in community life. OT reviews goals with her and she does not meet most long-term goals, but is pleased with the progress that she did make..  She states understanding all recommendations and need to continue on with self-management due to new fusion, management of chronic pains and symptoms.    PATIENT EDUCATION: Education details: see tx section above for details  Person educated: Patient Education method: Explanation, Demonstration, and  Handouts Education comprehension: verbalized understanding, returned demonstration, and needs further education     HOME EXERCISE PROGRAM: Access Code: [email or web address] URL: [email or web address] Prepared by: Benito Mccreedy     GOALS: Goals reviewed with patient? Yes   SHORT TERM GOALS: (STG required if POC>30 days)   Pt will demo/state understanding of initial HEP to improve pain levels and prerequisite motion. Target date: 11/17/21 Goal status: MET 11/17/21  LONG TERM GOALS:   Pt will improve functional ability by decreased impairment per Quick DASH assessment from 75% to 15% or better, for better quality of life. Target date: 03/10/22 Goal status: 02/22/22: Not Met, she states still struggling with pain and nerve pinching and "moderate issues at home   2.  Pt will improve grip strength in b/l grip from 65Rt/60Lt lbs to at least 75Rt/70Lt lbs for functional use in work/factory IADLs. Target date: 03/10/22 Goal status: 02/22/22: GOAL MET now 80# / 75#   3.  Pt will improve A/ROM in cervical flexion & ext from ~10* each to at least 60*/30* respectively, to have functional motion for tasks like driving, dressing, social participation with friends/family.  Target date: 01/27/22 Goal status: 02/22/22:  Goal not met, she did improve to about 20* in flexion and extension but was not able to move beyond.    4.  Pt will improve strength in b/l shoulders from 4-/5 MMT to at least 4+/5 MMT (no pain) to have increased functional ability to carry out selfcare and higher-level homecare tasks with no difficulty. Target date: 12/16/21 Goal status: 02/22/22: Goal considered met- she had 4+/5 MMT or greater in all planes of motion except lt sh flexion was 4/5 today, just slightly weaker.   5.  Pt will decrease pain at worst from "Moderate" to 2/10 or better to have better sleep and occupational participation in daily roles. Target date: 03/10/22 Goal status: 02/22/22: NOT MET continues to c/o  about 4/10 pain  6. Pt will push/pull up to 40# with no problems/pain to return to work activities. Target date: 03/10/22 Goal status: 02/22/22: GOAL not met- she did progress to ~20# push and pull repetitively, but any more weight exacerbated symptoms per self-report.     ASSESSMENT:   CLINICAL IMPRESSION: 02/23/22: Measures today show a plateau in progress over the past month, likely do to continued pain complaints, c/o "pinching nerve" in side of neck, chronic comorbidities, etc.  Due to her having full home exercise plan, having improved (from surgery and start of care) motion, strength, coordination and ability, she will discharge to self-care today. Though heavy or higher-level tasks still bother her and she continues to have some "pinching" nerve feeling in left side of neck/arm at times, she states being comfortable with D/C today as max benefit has been made from therapy at this time.  She was told that she can return to therapy if she has significant change in status (good or bad).     PLAN: OT FREQUENCY: D/C today   OT DURATION: D/C today   PLANNED INTERVENTIONS: self care/ADL training, therapeutic exercise, therapeutic activity, manual therapy, scar mobilization, passive range of motion, splinting, ultrasound, fluidotherapy, moist heat, cryotherapy, patient/family education, and coping strategies training   RECOMMENDED OTHER SERVICES: none now   CONSULTED AND AGREED WITH PLAN OF CARE: Patient   PLAN FOR NEXT SESSION:  D/C to self-management today   Benito Mccreedy, OTR/L, CHT 02/23/2022, 5:58 PM  OCCUPATIONAL THERAPY DISCHARGE SUMMARY  Visits from Start of Care: 22  Current functional level related to goals / functional outcomes: Pt has not met all goals to satisfactory levels, but had plateau of progress for > 1 month. She is pleased with outcomes, however and states doing "better" since surgery.   Remaining deficits: See details in note  Education / Equipment: Pt  has all needed materials and education. Pt understands how to continue on with self-management. See tx notes for more details.   Patient  agrees to discharge due to max benefits received from outpatient occupational therapy / hand therapy at this time.   Benito Mccreedy, OTR/L, CHT 02/23/22

## 2022-03-02 ENCOUNTER — Ambulatory Visit (INDEPENDENT_AMBULATORY_CARE_PROVIDER_SITE_OTHER): Payer: BC Managed Care – PPO

## 2022-03-02 ENCOUNTER — Ambulatory Visit (INDEPENDENT_AMBULATORY_CARE_PROVIDER_SITE_OTHER): Payer: BC Managed Care – PPO | Admitting: Specialist

## 2022-03-02 ENCOUNTER — Ambulatory Visit: Payer: BC Managed Care – PPO | Admitting: Specialist

## 2022-03-02 ENCOUNTER — Encounter: Payer: Self-pay | Admitting: Specialist

## 2022-03-02 VITALS — BP 116/79 | HR 85 | Ht 66.0 in | Wt 167.2 lb

## 2022-03-02 DIAGNOSIS — Z981 Arthrodesis status: Secondary | ICD-10-CM

## 2022-03-02 DIAGNOSIS — M47812 Spondylosis without myelopathy or radiculopathy, cervical region: Secondary | ICD-10-CM

## 2022-03-02 DIAGNOSIS — M5416 Radiculopathy, lumbar region: Secondary | ICD-10-CM

## 2022-03-02 DIAGNOSIS — M48061 Spinal stenosis, lumbar region without neurogenic claudication: Secondary | ICD-10-CM | POA: Diagnosis not present

## 2022-03-02 MED ORDER — TIZANIDINE HCL 4 MG PO TABS: 4.0000 mg | ORAL_TABLET | Freq: Four times a day (QID) | ORAL | 0 refills | Status: DC | PRN
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
## 2022-03-02 NOTE — Progress Notes (Signed)
Office Visit Note   Patient: Lindsey Graham           Date of Birth: 12/16/1975           MRN: [id number] Visit Date: 03/02/2022              Requested by: Lavada Mesi, MD [address] [place],  [place] [address] PCP: Lavada Mesi, MD   Assessment & Plan: Visit Diagnoses:  1. S/P cervical spinal fusion   2. Spondylosis without myelopathy or radiculopathy, cervical region   3. Lumbar radiculopathy   4. Bilateral stenosis of lateral recess of lumbar spine     Plan: Avoid overhead lifting and overhead use of the arms. Do not lift greater than 5 lbs. Adjust head rest in vehicle to prevent hyperextension if rear ended. Take extra precautions to avoid falling. Avoid bending, stooping and avoid lifting weights greater than 10 lbs. Avoid prolong standing and walking. Avoid frequent bending and stooping  No lifting greater than 10 lbs. May use ice or moist heat for pain. Weight loss is of benefit. Dr. [place] Blas secretary/Assistant will call to arrange for epidural steroid injection    Follow-Up Instructions: Return in about 4 weeks (around 03/30/2022).   Orders:  Orders Placed This Encounter  Procedures   XR Cervical Spine 2 or 3 views   No orders of the defined types were placed in this encounter.     Procedures: No procedures performed   Clinical Data: No additional findings.   Subjective: Chief Complaint  Patient presents with   Neck - Follow-up    HPI  Review of Systems   Objective: Vital Signs: BP 116/79 (BP Location: Left Arm, Patient Position: Sitting)   Pulse 85   Ht 5\' 6"  (1.676 m)   Wt 167 lb 3.2 oz (75.8 kg)   BMI 26.99 kg/m   Physical Exam  Ortho Exam  Specialty Comments:  MRI LUMBAR SPINE WITHOUT CONTRAST   TECHNIQUE: Multiplanar, multisequence MR imaging of the lumbar spine was performed. No intravenous contrast was administered.   COMPARISON:  Lumbar MRI 10/27/2019   FINDINGS: Segmentation: S1 is a fully lumbarized  vertebral body. There is a normal disc space at S1-2. Numbering consistent with the prior MRI report.   Alignment:  Mild retrolisthesis L3-4, L4-5, L5-S1.   Vertebrae:  Normal bone marrow.  Negative for fracture or mass.   Conus medullaris and cauda equina: Conus extends to the L1-2 level. Conus and cauda equina appear normal.   Paraspinal and other soft tissues: Negative for paraspinous mass or adenopathy.   Disc levels:   L1-2: Negative   L2-3: Negative   L3-4: Negative   L4-5: Mild disc bulging with small central disc protrusion unchanged from prior. Mild facet hypertrophy. Negative for spinal or foraminal stenosis.   L5-S1: Shallow broad-based disc protrusion. Central annular fissure with small central disc protrusion unchanged. Bilateral facet and ligamentum flavum hypertrophy. Mild spinal stenosis and moderate subarticular stenosis bilaterally. No interval change.   S1-2: Negative   IMPRESSION: S1 is a fully lumbarized vertebra.   Disc bulging and small central disc protrusion L4-5 unchanged   Shallow central disc protrusion L5-S1 with bilateral facet hypertrophy. Mild spinal stenosis and moderate subarticular stenosis bilaterally, without interval change.     Electronically Signed   By: 10/29/2019 M.D.   On: 07/02/2020 15:01  Imaging: XR Cervical Spine 2 or 3 views  Result Date: 03/02/2022 Ap and lateral radiographs with flexion and extension show about 1.5  mm of motion present. No lucency and the Areas of bone graft appear to be incorporating.     PMFS History: Patient Active Problem List   Diagnosis Date Noted   Herniation of cervical intervertebral disc with radiculopathy 08/09/2021    Priority: High   Other spondylosis with radiculopathy, cervical region    S/P cervical spinal fusion 08/09/2021   Chronic low back pain 03/26/2020   Neck pain 03/26/2020   Other chronic pain 03/26/2020   Nodule of upper lobe of right lung 10/08/2019   Head  injury 06/04/2019   Post concussion syndrome 06/04/2019   Chronic migraine 06/04/2019   Back pain 06/04/2019   Past Medical History:  Diagnosis Date   Anxiety    Concussion    Dyspnea    with COVID   Migraines    PONV (postoperative nausea and vomiting)    Seasonal allergies     Family History  Problem Relation Age of Onset   Crohn's disease Mother    COPD Father    Hypertension Father    Colon cancer Maternal Grandmother    Heart attack Paternal Grandfather    Colon cancer Maternal Uncle    Colon polyps Maternal Uncle    Diabetes Maternal Uncle    Hernia Maternal Uncle        hernia attached to intestines   Other Son        pancreas not breaking down food properly   Brain cancer Paternal Uncle    Heart attack Paternal Uncle        x 2    Past Surgical History:  Procedure Laterality Date   ANTERIOR CERVICAL DECOMP/DISCECTOMY FUSION N/A 08/09/2021   Procedure: ANTERIOR CERVICAL DISCECTOMY FUSION C4-5 AND C5-6 WITH PLATE, SCREWS, ALLOGRAFT, LOCAL BONE GRAFT, VIVIGEN;  Surgeon: Kerrin Champagne, MD;  Location: [facility] OR;  Service: Orthopedics;  Laterality: N/A;   EYELID LACERATION REPAIR Right    Stye removal   Social History   Occupational History   Occupation: Nature conservation officer: [employer]  Tobacco Use   Smoking status: Former    Types: E-cigarettes    Quit date: 08/2019    Years since quitting: 2.5   Smokeless tobacco: Never  Vaping Use   Vaping Use: Former  Substance and Sexual Activity   Alcohol use: Never   Drug use: Never   Sexual activity: Yes

## 2022-03-16 ENCOUNTER — Telehealth: Payer: Self-pay | Admitting: Neurology

## 2022-03-16 NOTE — Telephone Encounter (Signed)
PA completed on [employer] KEY: [id number] Awaiting on determination

## 2022-03-18 ENCOUNTER — Encounter (HOSPITAL_BASED_OUTPATIENT_CLINIC_OR_DEPARTMENT_OTHER): Payer: Self-pay | Admitting: Emergency Medicine

## 2022-03-18 ENCOUNTER — Other Ambulatory Visit: Payer: Self-pay

## 2022-03-18 ENCOUNTER — Emergency Department (HOSPITAL_BASED_OUTPATIENT_CLINIC_OR_DEPARTMENT_OTHER)
Admission: EM | Admit: 2022-03-18 | Discharge: 2022-03-18 | Disposition: A | Discharge status: Home / Self Care | Payer: BC Managed Care – PPO | Attending: Emergency Medicine | Admitting: Emergency Medicine

## 2022-03-18 DIAGNOSIS — L600 Ingrowing nail: Secondary | ICD-10-CM | POA: Insufficient documentation

## 2022-03-18 MED ORDER — CEPHALEXIN 500 MG PO CAPS: 500.0000 mg | ORAL_CAPSULE | Freq: Four times a day (QID) | ORAL | 0 refills | Status: AC

## 2022-03-18 MED ORDER — FLUCONAZOLE 150 MG PO TABS: 150.0000 mg | ORAL_TABLET | Freq: Every day | ORAL | 0 refills | Status: DC

## 2022-03-18 NOTE — ED Provider Notes (Signed)
[facility] EMERGENCY DEPT Provider Note   CSN: [id number] Arrival date & time: 03/18/22  1410     History  Chief Complaint  Patient presents with   Toe Pain    Lindsey Graham is a 46 y.o. female.  46 year old female presents with concern for ingrown toenail of her left great toe.  Patient reports returning to work recently, wears steel toe boots, pain progressively worsening over the past few days.  Patient tried to remove the lateral aspect of the toenail that she feels is ingrown but feels there is still nail present.  Denies drainage from the area, no fevers.  No other complaints or concerns.  Notes that she did see podiatry for prior ingrown toenail on the opposite foot however this podiatrist is retiring.       Home Medications Prior to Admission medications   Medication Sig Start Date End Date Taking? Authorizing Provider  cephALEXin (KEFLEX) 500 MG capsule Take 1 capsule (500 mg total) by mouth 4 (four) times daily for 7 days. 03/18/22 03/25/22 Yes Jeannie Fend, PA-C  Cholecalciferol (VITAMIN D3) 250 MCG (10000 UT) TABS Take 10,000 Units by mouth daily.    [provider]  hydrocortisone (ANUSOL-HC) 2.5 % rectal cream Place 1 application. rectally 2 (two) times daily. Use small pea sized amount per rectum twice a day for 7 days 01/17/22   Imogene Burn, MD  hydrocortisone (ANUSOL-HC) 25 MG suppository Place 1 suppository (25 mg total) rectally 2 (two) times daily. 12/12/21   Imogene Burn, MD  linaclotide [facility]) 145 MCG CAPS capsule Take 1 capsule (145 mcg total) by mouth daily before breakfast. 01/17/22   Imogene Burn, MD  loratadine (CLARITIN) 10 MG tablet Take 10 mg by mouth daily as needed for allergies.    [provider]  ondansetron (ZOFRAN) 8 MG tablet Take 1 tablet (8 mg total) by mouth in the morning, at noon, and at bedtime. 11/16/21   Imogene Burn, MD  rizatriptan (MAXALT) 10 MG tablet Take 1 tablet (10 mg total) by mouth as needed.  11/02/21   Lomax, Amy, NP  sertraline (ZOLOFT) 50 MG tablet TAKE 1 AND 1/2 TABLETS DAILY BY MOUTH 12/15/21   Kerrin Champagne, MD  tiZANidine (ZANAFLEX) 4 MG tablet Take 1 tablet (4 mg total) by mouth every 6 (six) hours as needed for muscle spasms. 03/02/22   Kerrin Champagne, MD      Allergies    Codeine    Review of Systems   Review of Systems Negative except as per HPI Physical Exam Updated Vital Signs BP 101/77 (BP Location: Right Arm)   Pulse 80   Temp 98.3 F (36.8 C)   Resp 16   Ht 5\' 6"  (1.676 m)   Wt 75.8 kg   LMP 03/13/2022 (Approximate)   SpO2 99%   BMI 26.97 kg/m  Physical Exam Vitals and nursing note reviewed.  Constitutional:      General: She is not in acute distress.    Appearance: She is well-developed. She is not diaphoretic.  HENT:     Head: Normocephalic and atraumatic.  Cardiovascular:     Pulses: Normal pulses.  Pulmonary:     Effort: Pulmonary effort is normal.  Musculoskeletal:        General: Tenderness present. No swelling or deformity.       Feet:  Feet:     Comments: Minimal erythema noted to lateral aspect of left greatWith tenderness in area.  No drainage.  No streaking. toenail Skin:    General: Skin is warm and dry.     Findings: Erythema present.  Neurological:     Mental Status: She is alert and oriented to person, place, and time.  Psychiatric:        Behavior: Behavior normal.     ED Results / Procedures / Treatments   Labs (all labs ordered are listed, but only abnormal results are displayed) Labs Reviewed - No data to display  EKG None  Radiology No results found.  Procedures Procedures    Medications Ordered in ED Medications - No data to display  ED Course/ Medical Decision Making/ A&P                           Medical Decision Making Risk Prescription drug management.   46 year old female with concern for ingrown toenail to left great toe.  Patient does have mild erythema and tenderness noted to the lateral  aspect of the nail without any streaking or severe symptoms.  Recommend Epsom salt soaks and applying topical bacitracin.  If symptoms progress, patient is given prescription for Keflex to take with plan to follow-up with podiatry, referral given.        Final Clinical Impression(s) / ED Diagnoses Final diagnoses:  Ingrown left big toenail    Rx / DC Orders ED Discharge Orders          Ordered    cephALEXin (KEFLEX) 500 MG capsule  4 times daily        03/18/22 1819              Jeannie Fend, PA-C 03/18/22 1823    Sloan Leiter, DO 03/23/22 0732

## 2022-03-18 NOTE — Discharge Instructions (Signed)
Recommend warm Epsom salt soaks, apply topical bacitracin to the toe.  If symptoms progress, start oral antibiotics.  Follow-up with podiatry.

## 2022-03-18 NOTE — ED Triage Notes (Signed)
Pt via pov from home with possible infection of left great toe. Pt reports that she believes it is ingrown and she is unable to get to the foot doctor. Pt alert & oriented, nad noted.

## 2022-03-20 NOTE — Telephone Encounter (Signed)
Received fax from [employer] that PA approved 03/17/22-06/17/22. PA# [employer] [phone].

## 2022-04-06 ENCOUNTER — Telehealth: Payer: Self-pay | Admitting: Specialist

## 2022-04-06 ENCOUNTER — Encounter: Payer: Self-pay | Admitting: Physical Medicine and Rehabilitation

## 2022-04-06 ENCOUNTER — Ambulatory Visit (INDEPENDENT_AMBULATORY_CARE_PROVIDER_SITE_OTHER): Payer: BC Managed Care – PPO | Admitting: Physical Medicine and Rehabilitation

## 2022-04-06 ENCOUNTER — Ambulatory Visit: Payer: Self-pay

## 2022-04-06 VITALS — BP 127/60 | HR 61

## 2022-04-06 DIAGNOSIS — M5416 Radiculopathy, lumbar region: Secondary | ICD-10-CM | POA: Diagnosis not present

## 2022-04-06 MED ORDER — METHYLPREDNISOLONE ACETATE 80 MG/ML IJ SUSP
40.0000 mg | Freq: Once | INTRAMUSCULAR | Status: AC
  Administered 2022-04-06: 40 mg

## 2022-04-06 NOTE — Patient Instructions (Signed)

## 2022-04-06 NOTE — Telephone Encounter (Signed)
Pt is in office today with Dr.Newton and was wondering about her Lawyer letter about her back and neck. She states Dr.Nitka said he would write it for her about 2 months ago. Just needing note before he leaves this practice.   Cb  [phone]

## 2022-04-06 NOTE — Progress Notes (Signed)
Pt state lower back pain that travels right leg. Pt state walking, standing and laying down makes the pain worse. Pt state she takes pain meds and uses ice to help ease her pain.  Numeric Pain Rating Scale and Functional Assessment Average Pain 7   In the last MONTH (on 0-10 scale) has pain interfered with the following?  1. General activity like being  able to carry out your everyday physical activities such as walking, climbing stairs, carrying groceries, or moving a chair?  Rating(10)   +Driver, -BT, -Dye Allergies.

## 2022-04-13 NOTE — Procedures (Signed)
Lumbosacral Transforaminal Epidural Steroid Injection - Sub-Pedicular Approach with Fluoroscopic Guidance  Patient: Lindsey Graham      Date of Birth: 10/01/1975 MRN: [id number] PCP: Lavada Mesi, MD      Visit Date: 04/06/2022   Universal Protocol:    Date/Time: 04/06/2022  Consent Given By: the patient  Position: PRONE  Additional Comments: Vital signs were monitored before and after the procedure. Patient was prepped and draped in the usual sterile fashion. The correct patient, procedure, and site was verified.   Injection Procedure Details:   Procedure diagnoses: Lumbar radiculopathy [M54.16]    Meds Administered:  Meds ordered this encounter  Medications   methylPREDNISolone acetate (DEPO-MEDROL) injection 40 mg    Laterality: Right  Location/Site: L5  Needle:5.0 in., 22 ga.  Short bevel or Quincke spinal needle  Needle Placement: Transforaminal  Findings:    -Comments: Excellent flow of contrast along the nerve, nerve root and into the epidural space.  Procedure Details: After squaring off the end-plates to get a true AP view, the C-arm was positioned so that an oblique view of the foramen as noted above was visualized. The target area is just inferior to the "nose of the scotty dog" or sub pedicular. The soft tissues overlying this structure were infiltrated with 2-3 ml. of 1% Lidocaine without Epinephrine.  The spinal needle was inserted toward the target using a "trajectory" view along the fluoroscope beam.  Under AP and lateral visualization, the needle was advanced so it did not puncture dura and was located close the 6 O'Clock position of the pedical in AP tracterory. Biplanar projections were used to confirm position. Aspiration was confirmed to be negative for CSF and/or blood. A 1-2 ml. volume of Isovue-250 was injected and flow of contrast was noted at each level. Radiographs were obtained for documentation purposes.   After attaining the desired flow of  contrast documented above, a 0.5 to 1.0 ml test dose of 0.25% Marcaine was injected into each respective transforaminal space.  The patient was observed for 90 seconds post injection.  After no sensory deficits were reported, and normal lower extremity motor function was noted,   the above injectate was administered so that equal amounts of the injectate were placed at each foramen (level) into the transforaminal epidural space.   Additional Comments:  The patient tolerated the procedure well Dressing: 2 x 2 sterile gauze and Band-Aid    Post-procedure details: Patient was observed during the procedure. Post-procedure instructions were reviewed.  Patient left the clinic in stable condition.

## 2022-04-13 NOTE — Progress Notes (Signed)
Lindsey Graham - 46 y.o. female MRN [id number]  Date of birth: June 04, 1976  Office Visit Note: Visit Date: 04/06/2022 PCP: Lavada Mesi, MD Referred by: Kerrin Champagne, MD  Subjective: Chief Complaint  Patient presents with   Right Leg - Pain   Lower Back - Pain   HPI:  Lindsey Graham is a 46 y.o. female who comes in today at the request of Dr. [name] for planned Right L5-S1 Lumbar Transforaminal epidural steroid injection with fluoroscopic guidance.  The patient has failed conservative care including home exercise, medications, time and activity modification.  This injection will be diagnostic and hopefully therapeutic.  Please see requesting physician notes for further details and justification.   ROS Otherwise per HPI.  Assessment & Plan: Visit Diagnoses:    ICD-10-CM   1. Lumbar radiculopathy  M54.16 XR C-ARM NO REPORT    Epidural Steroid injection    methylPREDNISolone acetate (DEPO-MEDROL) injection 40 mg      Plan: No additional findings.   Meds & Orders:  Meds ordered this encounter  Medications   methylPREDNISolone acetate (DEPO-MEDROL) injection 40 mg    Orders Placed This Encounter  Procedures   XR C-ARM NO REPORT   Epidural Steroid injection    Follow-up: Return for visit to requesting provider as needed.   Procedures: No procedures performed  Lumbosacral Transforaminal Epidural Steroid Injection - Sub-Pedicular Approach with Fluoroscopic Guidance  Patient: Lindsey Graham      Date of Birth: [birth year] MRN: [id number] PCP: Lavada Mesi, MD      Visit Date: 04/06/2022   Universal Protocol:    Date/Time: 04/06/2022  Consent Given By: the patient  Position: PRONE  Additional Comments: Vital signs were monitored before and after the procedure. Patient was prepped and draped in the usual sterile fashion. The correct patient, procedure, and site was verified.   Injection Procedure Details:   Procedure diagnoses: Lumbar radiculopathy [M54.16]     Meds Administered:  Meds ordered this encounter  Medications   methylPREDNISolone acetate (DEPO-MEDROL) injection 40 mg    Laterality: Right  Location/Site: L5  Needle:5.0 in., 22 ga.  Short bevel or Quincke spinal needle  Needle Placement: Transforaminal  Findings:    -Comments: Excellent flow of contrast along the nerve, nerve root and into the epidural space.  Procedure Details: After squaring off the end-plates to get a true AP view, the C-arm was positioned so that an oblique view of the foramen as noted above was visualized. The target area is just inferior to the "nose of the scotty dog" or sub pedicular. The soft tissues overlying this structure were infiltrated with 2-3 ml. of 1% Lidocaine without Epinephrine.  The spinal needle was inserted toward the target using a "trajectory" view along the fluoroscope beam.  Under AP and lateral visualization, the needle was advanced so it did not puncture dura and was located close the 6 O'Clock position of the pedical in AP tracterory. Biplanar projections were used to confirm position. Aspiration was confirmed to be negative for CSF and/or blood. A 1-2 ml. volume of Isovue-250 was injected and flow of contrast was noted at each level. Radiographs were obtained for documentation purposes.   After attaining the desired flow of contrast documented above, a 0.5 to 1.0 ml test dose of 0.25% Marcaine was injected into each respective transforaminal space.  The patient was observed for 90 seconds post injection.  After no sensory deficits were reported, and normal lower extremity motor function was noted,   the above  injectate was administered so that equal amounts of the injectate were placed at each foramen (level) into the transforaminal epidural space.   Additional Comments:  The patient tolerated the procedure well Dressing: 2 x 2 sterile gauze and Band-Aid    Post-procedure details: Patient was observed during the  procedure. Post-procedure instructions were reviewed.  Patient left the clinic in stable condition.    Clinical History: MRI LUMBAR SPINE WITHOUT CONTRAST   TECHNIQUE: Multiplanar, multisequence MR imaging of the lumbar spine was performed. No intravenous contrast was administered.   COMPARISON:  Lumbar MRI 10/27/2019   FINDINGS: Segmentation: S1 is a fully lumbarized vertebral body. There is a normal disc space at S1-2. Numbering consistent with the prior MRI report.   Alignment:  Mild retrolisthesis L3-4, L4-5, L5-S1.   Vertebrae:  Normal bone marrow.  Negative for fracture or mass.   Conus medullaris and cauda equina: Conus extends to the L1-2 level. Conus and cauda equina appear normal.   Paraspinal and other soft tissues: Negative for paraspinous mass or adenopathy.   Disc levels:   L1-2: Negative   L2-3: Negative   L3-4: Negative   L4-5: Mild disc bulging with small central disc protrusion unchanged from prior. Mild facet hypertrophy. Negative for spinal or foraminal stenosis.   L5-S1: Shallow broad-based disc protrusion. Central annular fissure with small central disc protrusion unchanged. Bilateral facet and ligamentum flavum hypertrophy. Mild spinal stenosis and moderate subarticular stenosis bilaterally. No interval change.   S1-2: Negative   IMPRESSION: S1 is a fully lumbarized vertebra.   Disc bulging and small central disc protrusion L4-5 unchanged   Shallow central disc protrusion L5-S1 with bilateral facet hypertrophy. Mild spinal stenosis and moderate subarticular stenosis bilaterally, without interval change.     Electronically Signed   By: Marlan Palau M.D.   On: 07/02/2020 15:01     Objective:  VS:  HT:    WT:   BMI:     BP:127/60  HR:61bpm  TEMP: ( )  RESP:  Physical Exam Vitals and nursing note reviewed.  Constitutional:      General: She is not in acute distress.    Appearance: Normal appearance. She is not  ill-appearing.  HENT:     Head: Normocephalic and atraumatic.     Right Ear: External ear normal.     Left Ear: External ear normal.  Eyes:     Extraocular Movements: Extraocular movements intact.  Cardiovascular:     Rate and Rhythm: Normal rate.     Pulses: Normal pulses.  Pulmonary:     Effort: Pulmonary effort is normal. No respiratory distress.  Abdominal:     General: There is no distension.     Palpations: Abdomen is soft.  Musculoskeletal:        General: Tenderness present.     Cervical back: Neck supple.     Right lower leg: No edema.     Left lower leg: No edema.     Comments: Patient has good distal strength with no pain over the greater trochanters.  No clonus or focal weakness.  Skin:    Findings: No erythema, lesion or rash.  Neurological:     General: No focal deficit present.     Mental Status: She is alert and oriented to person, place, and time.     Sensory: No sensory deficit.     Motor: No weakness or abnormal muscle tone.     Coordination: Coordination normal.  Psychiatric:  Mood and Affect: Mood normal.        Behavior: Behavior normal.      Imaging: No results found.

## 2022-04-14 ENCOUNTER — Other Ambulatory Visit: Payer: Self-pay | Admitting: Specialist

## 2022-04-20 ENCOUNTER — Ambulatory Visit (INDEPENDENT_AMBULATORY_CARE_PROVIDER_SITE_OTHER): Payer: BC Managed Care – PPO | Admitting: Specialist

## 2022-04-20 ENCOUNTER — Encounter: Payer: Self-pay | Admitting: Specialist

## 2022-04-20 VITALS — BP 112/82 | HR 89 | Ht 66.0 in | Wt 167.1 lb

## 2022-04-20 DIAGNOSIS — G8929 Other chronic pain: Secondary | ICD-10-CM

## 2022-04-20 DIAGNOSIS — M542 Cervicalgia: Secondary | ICD-10-CM

## 2022-04-20 DIAGNOSIS — M545 Low back pain, unspecified: Secondary | ICD-10-CM

## 2022-04-25 ENCOUNTER — Telehealth: Payer: Self-pay | Admitting: Specialist

## 2022-04-25 NOTE — Telephone Encounter (Signed)
Received medical records release form from [facility]'s office/Forwarding to medical records today

## 2022-05-01 ENCOUNTER — Ambulatory Visit (INDEPENDENT_AMBULATORY_CARE_PROVIDER_SITE_OTHER): Payer: BC Managed Care – PPO | Admitting: Specialist

## 2022-05-01 ENCOUNTER — Encounter: Payer: Self-pay | Admitting: Specialist

## 2022-05-01 VITALS — BP 115/81 | HR 101 | Ht 66.0 in | Wt 167.0 lb

## 2022-05-01 DIAGNOSIS — M48061 Spinal stenosis, lumbar region without neurogenic claudication: Secondary | ICD-10-CM | POA: Diagnosis not present

## 2022-05-01 DIAGNOSIS — Z981 Arthrodesis status: Secondary | ICD-10-CM | POA: Diagnosis not present

## 2022-05-01 DIAGNOSIS — M47816 Spondylosis without myelopathy or radiculopathy, lumbar region: Secondary | ICD-10-CM | POA: Diagnosis not present

## 2022-05-01 DIAGNOSIS — M47812 Spondylosis without myelopathy or radiculopathy, cervical region: Secondary | ICD-10-CM

## 2022-05-01 DIAGNOSIS — M5126 Other intervertebral disc displacement, lumbar region: Secondary | ICD-10-CM

## 2022-05-01 NOTE — Progress Notes (Signed)
Office Visit Note   Patient: Lindsey Graham           Date of Birth: 1976-05-19           MRN: [id number] Visit Date: 05/01/2022              Requested by: Eunice Blase, MD [place] [place],  [place] [address] PCP: Eunice Blase, MD   Assessment & Plan: Visit Diagnoses:  1. S/P cervical spinal fusion   2. Spondylosis without myelopathy or radiculopathy, cervical region   3. Bilateral stenosis of lateral recess of lumbar spine   4. Spondylosis without myelopathy or radiculopathy, lumbar region   5. Lumbar discogenic pain syndrome     Plan: Avoid overhead lifting and overhead use of the arms. Do not lift greater than 5 lbs. Adjust head rest in vehicle to prevent hyperextension if rear ended. Take extra precautions to avoid falling Avoid bending, stooping and avoid lifting weights greater than 10 lbs. Avoid prolong standing and walking. Avoid frequent bending and stooping  No lifting greater than 10 lbs. May use ice or moist heat for pain. Weight loss is of benefit. Handicap license is approved for longer distance walking intolerance.  Call if you wish to consider ESI Dr. Romona Curls secretary/Assistant will call to arrange for epidural steroid injection   Follow-Up Instructions: Return in about 1 month (around 05/31/2022).   Orders:  No orders of the defined types were placed in this encounter.  No orders of the defined types were placed in this encounter.     Procedures: No procedures performed   Clinical Data: No additional findings.   Subjective: Chief Complaint  Patient presents with   Lower Back - Follow-up    Had a right L5 Tf injection on 04/06/22    46 year old female with history of neck and low back pain. She has undergone ESI right L5 by Dr. Ernestina Patches 04/06/2022. She has returned to work after a period of 7 months absence from work. She is back to full time work, Sports coach, Horticulturist, commercial for [employer]. She is trying to  maintain her job at light work but she has to refuse to do some  Work like with overhead use of arms. Right arm is better but she has intermittant pain into the left scapula and left sided muscles. She is taking naprosyn and tizanidine at night. She is having some spasms and the physical therapist at work is helping by taping  her shoulders. Complains of pain into the right upper buttock and with pain that radiates into the right heel and right foot. Numbness into the right leg int the right foot presently pain runs right posterolateral into the right knee.    Review of Systems  Constitutional: Negative.   HENT: Negative.    Eyes:  Positive for visual disturbance.  Respiratory: Negative.    Cardiovascular: Negative.   Gastrointestinal:  Positive for nausea.  Endocrine: Negative.   Genitourinary: Negative.   Musculoskeletal:  Positive for back pain.  Skin: Negative.   Allergic/Immunologic: Negative.   Neurological:  Positive for weakness and numbness.  Hematological: Negative.   Psychiatric/Behavioral: Negative.       Objective: Vital Signs: BP 115/81 (BP Location: Left Arm, Patient Position: Sitting)   Pulse (!) 101   Ht '5\' 6"'  (1.676 m)   Wt 167 lb (75.8 kg)   BMI 26.95 kg/m   Physical Exam Constitutional:      Appearance: She is well-developed.  HENT:  Head: Normocephalic and atraumatic.  Eyes:     Pupils: Pupils are equal, round, and reactive to light.  Pulmonary:     Effort: Pulmonary effort is normal.     Breath sounds: Normal breath sounds.  Abdominal:     General: Bowel sounds are normal.     Palpations: Abdomen is soft.  Musculoskeletal:     Cervical back: Normal range of motion and neck supple.     Lumbar back: Negative right straight leg raise test and negative left straight leg raise test.  Skin:    General: Skin is warm and dry.  Neurological:     Mental Status: She is alert and oriented to person, place, and time.  Psychiatric:        Behavior:  Behavior normal.        Thought Content: Thought content normal.        Judgment: Judgment normal.    Back Exam   Tenderness  The patient is experiencing tenderness in the cervical and lumbar.  Range of Motion  Extension:  abnormal  Flexion:  abnormal  Lateral bend right:  abnormal  Lateral bend left:  abnormal  Rotation right:  abnormal  Rotation left:  abnormal   Muscle Strength  Right Quadriceps:  5/5  Left Quadriceps:  5/5  Right Hamstrings:  5/5  Left Hamstrings:  5/5   Tests  Straight leg raise right: negative Straight leg raise left: negative  Reflexes  Patellar:  2/4 Achilles:  2/4  Other  Toe walk: normal Heel walk: normal Sensation: normal Erythema: no back redness Scars: absent  Comments:  Incisions healed left neck.      Specialty Comments:  MRI LUMBAR SPINE WITHOUT CONTRAST   TECHNIQUE: Multiplanar, multisequence MR imaging of the lumbar spine was performed. No intravenous contrast was administered.   COMPARISON:  Lumbar MRI 10/27/2019   FINDINGS: Segmentation: S1 is a fully lumbarized vertebral body. There is a normal disc space at S1-2. Numbering consistent with the prior MRI report.   Alignment:  Mild retrolisthesis L3-4, L4-5, L5-S1.   Vertebrae:  Normal bone marrow.  Negative for fracture or mass.   Conus medullaris and cauda equina: Conus extends to the L1-2 level. Conus and cauda equina appear normal.   Paraspinal and other soft tissues: Negative for paraspinous mass or adenopathy.   Disc levels:   L1-2: Negative   L2-3: Negative   L3-4: Negative   L4-5: Mild disc bulging with small central disc protrusion unchanged from prior. Mild facet hypertrophy. Negative for spinal or foraminal stenosis.   L5-S1: Shallow broad-based disc protrusion. Central annular fissure with small central disc protrusion unchanged. Bilateral facet and ligamentum flavum hypertrophy. Mild spinal stenosis and moderate subarticular stenosis  bilaterally. No interval change.   S1-2: Negative   IMPRESSION: S1 is a fully lumbarized vertebra.   Disc bulging and small central disc protrusion L4-5 unchanged   Shallow central disc protrusion L5-S1 with bilateral facet hypertrophy. Mild spinal stenosis and moderate subarticular stenosis bilaterally, without interval change.     Electronically Signed   By: Franchot Gallo M.D.   On: 07/02/2020 15:01  Imaging: No results found.   PMFS History: Patient Active Problem List   Diagnosis Date Noted   Herniation of cervical intervertebral disc with radiculopathy 08/09/2021    Priority: High   Other spondylosis with radiculopathy, cervical region    S/P cervical spinal fusion 08/09/2021   Chronic low back pain 03/26/2020   Neck pain 03/26/2020   Other chronic  pain 03/26/2020   Nodule of upper lobe of right lung 10/08/2019   Head injury 06/04/2019   Post concussion syndrome 06/04/2019   Chronic migraine 06/04/2019   Back pain 06/04/2019   Past Medical History:  Diagnosis Date   Anxiety    Concussion    Dyspnea    with COVID   Migraines    PONV (postoperative nausea and vomiting)    Seasonal allergies     Family History  Problem Relation Age of Onset   Crohn's disease Mother    COPD Father    Hypertension Father    Colon cancer Maternal Grandmother    Heart attack Paternal Grandfather    Colon cancer Maternal Uncle    Colon polyps Maternal Uncle    Diabetes Maternal Uncle    Hernia Maternal Uncle        hernia attached to intestines   Other Son        pancreas not breaking down food properly   Brain cancer Paternal Uncle    Heart attack Paternal Uncle        x 2    Past Surgical History:  Procedure Laterality Date   ANTERIOR CERVICAL DECOMP/DISCECTOMY FUSION N/A 08/09/2021   Procedure: ANTERIOR CERVICAL DISCECTOMY FUSION C4-5 AND C5-6 WITH PLATE, SCREWS, ALLOGRAFT, LOCAL BONE GRAFT, VIVIGEN;  Surgeon: Jessy Oto, MD;  Location: [facility];  Service:  Orthopedics;  Laterality: N/A;   EYELID LACERATION REPAIR Right    Stye removal   Social History   Occupational History   Occupation: Publishing copy: [employer]  Tobacco Use   Smoking status: Former    Types: E-cigarettes    Quit date: 08/2019    Years since quitting: 2.7   Smokeless tobacco: Never  Vaping Use   Vaping Use: Former  Substance and Sexual Activity   Alcohol use: Never   Drug use: Never   Sexual activity: Yes

## 2022-05-01 NOTE — Patient Instructions (Signed)
Avoid overhead lifting and overhead use of the arms. Do not lift greater than 5 lbs. Adjust head rest in vehicle to prevent hyperextension if rear ended. Take extra precautions to avoid falling Avoid bending, stooping and avoid lifting weights greater than 10 lbs. Avoid prolong standing and walking. Avoid frequent bending and stooping  No lifting greater than 10 lbs. May use ice or moist heat for pain. Weight loss is of benefit. Handicap license is approved for longer distance walking intolerance.  Call if you wish to consider ESI Dr. Romona Curls secretary/Assistant will call to arrange for epidural steroid injection  .

## 2022-05-09 NOTE — Progress Notes (Signed)
Patient left without being seen sill reschedule for early next week.

## 2022-05-12 ENCOUNTER — Ambulatory Visit: Payer: BC Managed Care – PPO | Admitting: Specialist

## 2022-05-15 ENCOUNTER — Other Ambulatory Visit: Payer: Self-pay | Admitting: Internal Medicine

## 2022-05-30 ENCOUNTER — Encounter: Payer: Self-pay | Admitting: Internal Medicine

## 2022-05-30 ENCOUNTER — Other Ambulatory Visit (INDEPENDENT_AMBULATORY_CARE_PROVIDER_SITE_OTHER): Payer: BC Managed Care – PPO

## 2022-05-30 ENCOUNTER — Ambulatory Visit (INDEPENDENT_AMBULATORY_CARE_PROVIDER_SITE_OTHER): Payer: BC Managed Care – PPO | Admitting: Internal Medicine

## 2022-05-30 VITALS — BP 98/68 | HR 85 | Ht 66.0 in | Wt 165.0 lb

## 2022-05-30 DIAGNOSIS — R110 Nausea: Secondary | ICD-10-CM | POA: Diagnosis not present

## 2022-05-30 DIAGNOSIS — R1013 Epigastric pain: Secondary | ICD-10-CM | POA: Diagnosis not present

## 2022-05-30 DIAGNOSIS — K649 Unspecified hemorrhoids: Secondary | ICD-10-CM

## 2022-05-30 DIAGNOSIS — K5900 Constipation, unspecified: Secondary | ICD-10-CM

## 2022-05-30 DIAGNOSIS — E559 Vitamin D deficiency, unspecified: Secondary | ICD-10-CM

## 2022-05-30 LAB — VITAMIN D 25 HYDROXY (VIT D DEFICIENCY, FRACTURES): VITD: 25.69 ng/mL — ABNORMAL LOW (ref 30.00–100.00)

## 2022-05-30 LAB — HEPATIC FUNCTION PANEL
ALT: 13 U/L (ref 0–35)
AST: 15 U/L (ref 0–37)
Albumin: 4.6 g/dL (ref 3.5–5.2)
Alkaline Phosphatase: 54 U/L (ref 39–117)
Bilirubin, Direct: 0 mg/dL (ref 0.0–0.3)
Total Bilirubin: 0.3 mg/dL (ref 0.2–1.2)
Total Protein: 7.2 g/dL (ref 6.0–8.3)

## 2022-05-30 MED ORDER — OMEPRAZOLE 40 MG PO CPDR: 40.0000 mg | DELAYED_RELEASE_CAPSULE | Freq: Every day | ORAL | 5 refills | Status: AC

## 2022-05-30 MED ORDER — LINACLOTIDE 290 MCG PO CAPS: 290.0000 ug | ORAL_CAPSULE | Freq: Every day | ORAL | 5 refills | Status: DC

## 2022-05-30 NOTE — Progress Notes (Signed)
Chief Complaint: Constipation, abdominal pain, rectal bleeding  HPI : 46 year old female with history of seasonal allergies, anxiety, and migraines presents for follow up of constipation and GERD  Interval History: Even after she eats, she feels like she is swollen. Endorses nausea, chest burning, and regurgitation. Pepsi seems to help with her symptoms. Zofran is helping with the nausea. Denies vomiting. Occasionally has dysphagia from her prior spine surgery on 07/2021. She does feel like the Linzess is helping, but she still has one BM every 2-7 days. Denies NSAIDs.  Patient has had history of vitamin D deficiency in the past.  Current Outpatient Medications  Medication Sig Dispense Refill   Cholecalciferol (VITAMIN D3) 250 MCG (10000 UT) TABS Take 10,000 Units by mouth daily.     hydrocortisone (ANUSOL-HC) 2.5 % rectal cream PLACE RECTALLY 2 (TWO) TIMES DAILY. USE SMALL PEA SIZED AMOUNT PER RECTUM TWICE A DAY FOR 7 DAYS 30 g 1   hydrocortisone (ANUSOL-HC) 25 MG suppository Place 1 suppository (25 mg total) rectally 2 (two) times daily. 14 suppository 0   linaclotide (LINZESS) 145 MCG CAPS capsule Take 1 capsule (145 mcg total) by mouth daily before breakfast. 90 capsule 3   loratadine (CLARITIN) 10 MG tablet Take 10 mg by mouth daily as needed for allergies.     Naproxen 375 MG TBEC TAKE 1 TABLET EVERY DAY WITH BREAKFAST 30 tablet 3   ondansetron (ZOFRAN) 8 MG tablet TAKE 1 TABLET (8 MG TOTAL) BY MOUTH IN THE MORNING, AT NOON, AND AT BEDTIME. 30 tablet 2   rizatriptan (MAXALT) 10 MG tablet Take 1 tablet (10 mg total) by mouth as needed. 9 tablet 11   sertraline (ZOLOFT) 50 MG tablet TAKE 1 AND 1/2 TABLETS DAILY BY MOUTH 135 tablet 4   tiZANidine (ZANAFLEX) 4 MG tablet Take 1 tablet (4 mg total) by mouth every 6 (six) hours as needed for muscle spasms. 30 tablet 0   fluconazole (DIFLUCAN) 150 MG tablet Take 1 tablet (150 mg total) by mouth daily. (Patient not taking: Reported on 05/30/2022)  1 tablet 0   No current facility-administered medications for this visit.   Review of Systems: All systems reviewed and negative except where noted in HPI.   Physical Exam: BP 98/68   Pulse 85   Ht 5\' 6"  (1.676 m)   Wt 165 lb (74.8 kg)   SpO2 96%   BMI 26.63 kg/m  Constitutional: Pleasant,well-developed, female in no acute distress. HEENT: Normocephalic and atraumatic. Conjunctivae are normal. No scleral icterus. Cardiovascular: normal rate Pulmonary/chest: Effort normal Abdominal: Soft, nondistended, non-tender Extremities: No edema Neurological: Alert and oriented to person place and time. Skin: Skin is warm and dry. No rashes noted. Psychiatric: Normal mood and affect. Behavior is normal.  Labs 11/2020: TSH nml. Vit D levels low.   Labs 07/2021: CBC, CMP, and INR nml  Labs 11/2021: TTG IgA nml. IgA nml. Cortisol nml.   Colonoscopy 12/12/21: - The terminal ileum appeared normal. - Non-bleeding internal hemorrhoids were found during retroflexion.  ASSESSMENT AND PLAN: Epigastric ab pain Nausea Internal hemorrhoids Constipation Nausea GERD Patient presents for follow-up of epigastric ab pain and constipation issues. Her epigastric ab discomfort may be related to uncontrolled GERD. Will see if this pain responds to omeprazole 40 mg QD. Her constipation has improved on Linzess therapy but will see if an increased dosage helps the patient further with her symptoms. Will also rule out gallstones as a cause of her epigastric ab pain. The ultrasound will  allow for visualization of her liver since she has had elevated LFTs in the past, last noted in 2021. Since patient has history of low vitamin D levels and to recheck her LFTs, will get some labs today. - Check vitamin D and LFTs - Cont Zofran 8 mg TID PRN - Start omeprazole 40 mg QD - Increase Linzess from 145 mcg to 290 mcg QD - Check abdominal ultrasound - RTC 3 months. Consider offering hemorrhoidal banding in the  future  [name], MD

## 2022-05-30 NOTE — Patient Instructions (Addendum)
Your provider has requested that you go to the basement level for lab work before leaving today. Press "B" on the elevator. The lab is located at the first door on the left as you exit the elevator.  You have been scheduled for an abdominal ultrasound at [facility] ([place]) on 06/05/22 at 9:00 am. Please arrive 30 minutes prior to your appointment for registration. Make certain not to have anything to eat or drink 6 hours prior to your appointment. Should you need to reschedule your appointment, please contact radiology at [phone]. This test typically takes about 30 minutes to perform.  We have sent the following medications to your pharmacy for you to pick up at your convenience: omeprazole 40 mg to take before breakfast and Linzess 290 mcg daily.   The [facility] providers would like to encourage you to use [facility] to communicate with providers for non-urgent requests or questions.  Due to long hold times on the telephone, sending your provider a message by [facility] may be a faster and more efficient way to get a response.  Please allow 48 business hours for a response.  Please remember that this is for non-urgent requests.   Due to recent changes in healthcare laws, you may see the results of your imaging and laboratory studies on MyChart before your provider has had a chance to review them.  We understand that in some cases there may be results that are confusing or concerning to you. Not all laboratory results come back in the same time frame and the provider may be waiting for multiple results in order to interpret others.  Please give [place] 48 hours in order for your provider to thoroughly review all the results before contacting the office for clarification of your results.

## 2022-06-05 ENCOUNTER — Ambulatory Visit (HOSPITAL_BASED_OUTPATIENT_CLINIC_OR_DEPARTMENT_OTHER)
Admission: RE | Admit: 2022-06-05 | Discharge: 2022-06-05 | Disposition: A | Discharge status: Home / Self Care | Payer: BC Managed Care – PPO | Source: Ambulatory Visit | Attending: Internal Medicine | Admitting: Internal Medicine

## 2022-06-05 DIAGNOSIS — R110 Nausea: Secondary | ICD-10-CM | POA: Insufficient documentation

## 2022-06-05 DIAGNOSIS — R1013 Epigastric pain: Secondary | ICD-10-CM | POA: Insufficient documentation

## 2022-06-06 ENCOUNTER — Other Ambulatory Visit: Payer: Self-pay | Admitting: Obstetrics and Gynecology

## 2022-06-06 DIAGNOSIS — N6310 Unspecified lump in the right breast, unspecified quadrant: Secondary | ICD-10-CM

## 2022-06-21 ENCOUNTER — Other Ambulatory Visit: Payer: BC Managed Care – PPO

## 2022-06-27 ENCOUNTER — Ambulatory Visit
Admission: RE | Admit: 2022-06-27 | Discharge: 2022-06-27 | Disposition: A | Discharge status: Home / Self Care | Payer: BC Managed Care – PPO | Source: Ambulatory Visit | Attending: Obstetrics and Gynecology | Admitting: Obstetrics and Gynecology

## 2022-06-27 DIAGNOSIS — N6310 Unspecified lump in the right breast, unspecified quadrant: Secondary | ICD-10-CM

## 2022-06-29 ENCOUNTER — Ambulatory Visit: Payer: BC Managed Care – PPO | Admitting: Orthopedic Surgery

## 2022-06-29 ENCOUNTER — Ambulatory Visit (INDEPENDENT_AMBULATORY_CARE_PROVIDER_SITE_OTHER): Payer: BC Managed Care – PPO

## 2022-06-29 DIAGNOSIS — Z981 Arthrodesis status: Secondary | ICD-10-CM | POA: Diagnosis not present

## 2022-06-29 MED ORDER — NAPROXEN 500 MG PO TABS: 500.0000 mg | ORAL_TABLET | Freq: Two times a day (BID) | ORAL | 0 refills | Status: AC

## 2022-06-29 MED ORDER — TIZANIDINE HCL 4 MG PO TABS: 4.0000 mg | ORAL_TABLET | Freq: Four times a day (QID) | ORAL | 0 refills | Status: DC | PRN

## 2022-06-29 NOTE — Progress Notes (Signed)
Orthopedic Spine Surgery Office Note  Assessment: Patient is a 46 y.o. 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 Otelia Sergeant, on 08/09/2021 who presents for routine follow up    Plan: -Patient has been doing well since surgery. Is using less medication for pain relief. Tizanidine and naproxen are helping. Those were prescribed to her today -She has had lumbar spine issues in the past that Dr. Otelia Sergeant has seen her for. Injections have helped her in the past. Last one was 04/06/2022 to the right L5 nerve. If she gets return or worsening symptoms, will provide her a referral. Otherwise, we will revisit the issue when I see her again -Patient should return to office in 6-7 weeks, repeat x-rays of cervical spine at next visit: cervical AP/lateral/flex/ex   Patient expressed understanding of the plan and all questions were answered to the patient's satisfaction.   ___________________________________________________________________________   History:  Patient is a 46 y.o. female who presents today for cervical spine. Patient has a history of C4-6 ACDF with my partner, Dr. Otelia Sergeant, on 08/09/2021.  Patient symptoms are much better since the surgery. Pain has significantly improved. She is grateful for the work that Dr. Otelia Sergeant did. She still has some decreased sensation in her left arm on the ulnar aspect in the ulnar 2 digits.  Still has some pain that radiates down the arm but it is manageable with NSAIDs and seems to still be improving.  It mostly gets exacerbated if she is at work and standing or doing a repetitive activity for too long.  She has been using tizanidine and naproxen with good relief.  Usually she just needs 1 naproxen a day.  She is not having any symptoms down the right arm.  She also has low back pain that waxes and wanes in intensity.  At times it can be pretty significant.  This has been chronically present.  She also has pain that radiates down the posterior aspect of her right leg.   She has tried injections in the past and gets good relief with those.  Her last injection was on 04/06/2022 to the right L5 nerve.  She got satisfactory relief with that injection.  Her symptoms are currently tolerable.   Weakness: Yes, left arm feels slightly weaker.  No other weakness noted Difficulty with fine motor skills (e.g., buttoning shirts, handwriting): Denies Symptoms of imbalance: Denies Paresthesias and numbness: Yes, ulnar aspect of left forearm and hand.  No other numbness or paresthesias Bowel or bladder incontinence: Denies Saddle anesthesia: Denies  Treatments tried: Naproxen, tizanidine, injections  Review of systems: Denies fevers and chills, night sweats, unexplained weight loss, history of cancer, pain that wakes them at night  Past medical history: Anxiety Migraines  Allergies: codeine  Past surgical history:  C4-6 ACDF Eyelid laceration repair  Social history: Denies use of nicotine product (smoking, vaping, patches, smokeless) Alcohol use: denies Denies recreational drug use  Physical Exam:  General: no acute distress, appears stated age Neurologic: alert, answering questions appropriately, following commands Respiratory: unlabored breathing on room air, symmetric chest rise Psychiatric: appropriate affect, normal cadence to speech   MSK (spine):  -Strength exam      Left  Right Grip strength                5/5  5/5 Interosseus   5/5   5/5 Wrist extension  5/5  5/5 Wrist flexion   5/5  5/5 Elbow flexion   5/5  5/5 Deltoid  5/5  5/5  -Sensory exam    Sensation intact to light touch in C5-T1 nerve distributions of bilateral upper extremities (slightly decreased in C7/C8 distribution on the left arm)  -Incision over the anterior neck appears well-healed with no evidence of infection -Hoffman sign: Negative bilaterally -Clonus: No beats bilaterally -Interosseous wasting: None seen -Grip and release test: Negative -Gait: Normal  Left  shoulder exam: Some pain with extreme of internal rotation.  No pain through remainder of range of motion.  Mild pain with Jobe testing but no weakness.  No weakness with external rotation and arm at side.  Negative belly press.  Negative lift off. Right shoulder exam: No pain to range of motion  Tinel's at wrist: Negative bilaterally Durkan's: Negative bilaterally  Tinel's at elbow: Negative bilaterally  Imaging: XR of the cervical spine from 06/29/2022 was independently reviewed and interpreted, showing C4-6 ACDF with interbody devices in place.  Appears to be fusing across the interspaces.  Anterior instrumentation present with no evidence of complication.  No fracture or dislocation.  No evidence of instability on flexion/extension.   Patient name: Lindsey Graham Patient MRN: [id number] Date of visit: 06/29/22

## 2022-07-12 ENCOUNTER — Other Ambulatory Visit: Payer: Self-pay | Admitting: Internal Medicine

## 2022-07-27 ENCOUNTER — Other Ambulatory Visit: Payer: Self-pay | Admitting: Orthopedic Surgery

## 2022-08-09 ENCOUNTER — Other Ambulatory Visit: Payer: Self-pay | Admitting: Radiology

## 2022-08-09 MED ORDER — NAPROXEN 375 MG PO TBEC: DELAYED_RELEASE_TABLET | ORAL | 1 refills | Status: DC

## 2022-08-25 ENCOUNTER — Ambulatory Visit (INDEPENDENT_AMBULATORY_CARE_PROVIDER_SITE_OTHER): Payer: BC Managed Care – PPO

## 2022-08-25 ENCOUNTER — Ambulatory Visit: Payer: BC Managed Care – PPO | Admitting: Orthopedic Surgery

## 2022-08-25 DIAGNOSIS — Z981 Arthrodesis status: Secondary | ICD-10-CM

## 2022-08-25 MED ORDER — NAPROXEN 500 MG PO TABS: 500.0000 mg | ORAL_TABLET | Freq: Two times a day (BID) | ORAL | 2 refills | Status: DC

## 2022-08-25 NOTE — Progress Notes (Signed)
Orthopedic Spine Surgery Office Note   Assessment: Patient is a 47 y.o. 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 Louanne Skye who presents for routine follow up (~1 year from surgery)     Plan: -Naproxen was prescribed to her today since that has helped in the past. Told her not take the naproxen while using the steroid for her allergic reaction to amoxicillin. Also, instructed her to take the naproxen with meals and plenty of water when she is taking it -She has had lumbar spine issues in the past that Dr. Louanne Skye has seen her for. Injections have helped her in the past. Last one was 04/06/2022 to the right L5 nerve. If she gets return or worsening symptoms, will provide her a referral for repeat injection -Patient should return to office in 6 months, x-rays at next visit: cervical AP/lateral/flex/ex     Patient expressed understanding of the plan and all questions were answered to the patient's satisfaction.    ___________________________________________________________________________     History:   Patient is a 47 y.o. female who presents today for cervical spine. Patient has a history of C4-6 ACDF with my partner, Dr. Louanne Skye, on 08/09/2021.  Still feels her symptoms and pain are better since surgery. Like at our last visit, she still has some pain that radiates into her left arm. Feels it along the lateral aspect and into the ulnar hand and ulnar two digits. Right now, is not having any pain because she was prescribed steroid by another provider for an allergic reaction she had to amoxicillin. She says that naproxen does help the pain in her arm and makes it tolerable. She does not need the naproxen every day. She needs it on days where she is asked to do more physical work at her job. No symptoms down the right arm.   Treatments tried: Naproxen, tizanidine, injections   Review of systems: Denies fevers and chills, night sweats, unexplained weight loss, history of cancer, pain that  wakes her at night   Past medical history: Anxiety Migraines   Allergies: codeine   Past surgical history:  C4-6 ACDF Eyelid laceration repair   Social history: Denies use of nicotine product (smoking, vaping, patches, smokeless) Alcohol use: denies Denies recreational drug use   Physical Exam:   General: no acute distress, appears stated age Neurologic: alert, answering questions appropriately, following commands Respiratory: unlabored breathing on room air, symmetric chest rise Psychiatric: appropriate affect, normal cadence to speech     MSK (spine):   -Strength exam                                                   Left                  Right Grip strength                5/5                  5/5 Interosseus                  5/5                  5/5 Wrist extension            5/5  5/5 Wrist flexion                 5/5                  5/5 Elbow flexion                5/5                  5/5 Deltoid                          5/5                  5/5   -Sensory exam                           Sensation intact to light touch in C5-T1 nerve distributions of bilateral upper extremities    -Incision over the anterior neck appears well-healed with no evidence of infection -Hoffman sign: Negative bilaterally -Clonus: No beats bilaterally -Interosseous wasting: None seen -Grip and release test: Negative -Gait: Normal   Tinel's at wrist: Negative bilaterally Durkan's: Negative bilaterally   Tinel's at elbow: Negative bilaterally   Imaging: XR of the cervical spine from 08/25/2022 was independently reviewed and interpreted, showing C4-6 ACDF with interbody devices in place.  Appears to be fusion mass across the interspaces.  Anterior instrumentation present with no lucencey. Screws not backing out.  No fracture or dislocation. Unchanged alignment from prior films in 06/2022.     Patient name: Lindsey Graham Patient MRN: [id number] Date of visit: 08/25/22

## 2022-08-28 ENCOUNTER — Other Ambulatory Visit: Payer: Self-pay | Admitting: Orthopedic Surgery

## 2022-10-03 ENCOUNTER — Other Ambulatory Visit: Payer: Self-pay | Admitting: Internal Medicine
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
## 2022-10-30 NOTE — Patient Instructions (Signed)
Below is our plan:  We will continue rizatriptan 10mg  as needed.   Please make sure you are staying well hydrated. I recommend 50-60 ounces daily. Well balanced diet and regular exercise encouraged. Consistent sleep schedule with 6-8 hours recommended.   Please continue follow up with care team as directed.   Follow up with me in 1 year   You may receive a survey regarding today's visit. I encourage you to leave honest feed back as I do use this information to improve patient care. Thank you for seeing me today!

## 2022-10-31 ENCOUNTER — Ambulatory Visit (INDEPENDENT_AMBULATORY_CARE_PROVIDER_SITE_OTHER): Payer: BC Managed Care – PPO | Admitting: Family Medicine

## 2022-10-31 ENCOUNTER — Encounter: Payer: Self-pay | Admitting: Family Medicine

## 2022-10-31 VITALS — BP 110/79 | HR 79 | Ht 66.0 in | Wt 171.8 lb

## 2022-10-31 DIAGNOSIS — G44329 Chronic post-traumatic headache, not intractable: Secondary | ICD-10-CM | POA: Diagnosis not present

## 2022-10-31 MED ORDER — RIZATRIPTAN BENZOATE 10 MG PO TABS: 10.0000 mg | ORAL_TABLET | ORAL | 11 refills | Status: DC | PRN

## 2022-11-17 ENCOUNTER — Other Ambulatory Visit: Payer: Self-pay | Admitting: Orthopedic Surgery

## 2022-11-28 ENCOUNTER — Other Ambulatory Visit: Payer: Self-pay | Admitting: Obstetrics and Gynecology

## 2022-11-28 DIAGNOSIS — N644 Mastodynia: Secondary | ICD-10-CM

## 2023-01-15 ENCOUNTER — Ambulatory Visit: Payer: BC Managed Care – PPO

## 2023-01-15 ENCOUNTER — Ambulatory Visit
Admission: RE | Admit: 2023-01-15 | Discharge: 2023-01-15 | Disposition: A | Discharge status: Home / Self Care | Payer: BC Managed Care – PPO | Source: Ambulatory Visit | Attending: Obstetrics and Gynecology | Admitting: Obstetrics and Gynecology

## 2023-01-15 ENCOUNTER — Telehealth: Payer: Self-pay | Admitting: Physical Medicine and Rehabilitation

## 2023-01-15 DIAGNOSIS — N644 Mastodynia: Secondary | ICD-10-CM

## 2023-01-15 DIAGNOSIS — M5416 Radiculopathy, lumbar region: Secondary | ICD-10-CM

## 2023-01-15 NOTE — Addendum Note (Signed)
Addended by: Willia Craze on: 01/15/2023 03:04 PM   Modules accepted: Orders

## 2023-01-15 NOTE — Telephone Encounter (Signed)
Patient requesting another injection with Dr Alvester Morin, states last one was 03/2022 ?

## 2023-01-15 NOTE — Telephone Encounter (Signed)
I called and advised patient that order was placed

## 2023-01-21 ENCOUNTER — Other Ambulatory Visit: Payer: Self-pay | Admitting: Internal Medicine

## 2023-02-08 ENCOUNTER — Other Ambulatory Visit: Payer: Self-pay

## 2023-02-08 ENCOUNTER — Ambulatory Visit: Payer: BC Managed Care – PPO | Admitting: Physical Medicine and Rehabilitation

## 2023-02-08 VITALS — BP 114/80 | HR 74

## 2023-02-08 DIAGNOSIS — M5416 Radiculopathy, lumbar region: Secondary | ICD-10-CM | POA: Diagnosis not present

## 2023-02-08 MED ORDER — METHYLPREDNISOLONE ACETATE 80 MG/ML IJ SUSP
80.0000 mg | Freq: Once | INTRAMUSCULAR | Status: AC
  Administered 2023-02-08: 80 mg

## 2023-02-08 NOTE — Patient Instructions (Signed)

## 2023-02-08 NOTE — Progress Notes (Signed)
Functional Pain Scale - descriptive words and definitions  Distracting (5)    Aware of pain/able to complete some ADL's but limited by pain/sleep is affected and active distractions are only slightly useful. Moderate range order  Average Pain  varies   +Driver, -BT, -Dye Allergies.  Lower back pain on the right

## 2023-02-09 NOTE — Progress Notes (Signed)
Lindsey Graham - 47 y.o. female MRN [id number]  Date of birth: [birth year]  Office Visit Note: Visit Date: 02/08/2023 PCP: Lavada Mesi, MD Referred by: London Sheer, MD  Subjective: Chief Complaint  Patient presents with   Lower Back - Pain   HPI:  Lindsey Graham is a 47 y.o. female who comes in today at the request of Dr. Willia Craze for planned Right L5-S1 Lumbar Transforaminal epidural steroid injection with fluoroscopic guidance.  The patient has failed conservative care including home exercise, medications, time and activity modification.  This injection will be diagnostic and hopefully therapeutic.  Please see requesting physician notes for further details and justification.  Transitional S1 segment by numbering scheme.  S1 is fully lumbarized.   ROS Otherwise per HPI.  Assessment & Plan: Visit Diagnoses:    ICD-10-CM   1. Lumbar radiculopathy  M54.16 XR C-ARM NO REPORT    Epidural Steroid injection    methylPREDNISolone acetate (DEPO-MEDROL) injection 80 mg      Plan: No additional findings.   Meds & Orders:  Meds ordered this encounter  Medications   methylPREDNISolone acetate (DEPO-MEDROL) injection 80 mg    Orders Placed This Encounter  Procedures   XR C-ARM NO REPORT   Epidural Steroid injection    Follow-up: Return for visit to requesting provider as needed.   Procedures: No procedures performed  Lumbosacral Transforaminal Epidural Steroid Injection - Sub-Pedicular Approach with Fluoroscopic Guidance  Patient: Lindsey Graham      Date of Birth: Dec 09, 1975 MRN: [id number] PCP: Lavada Mesi, MD      Visit Date: 02/08/2023   Universal Protocol:    Date/Time: 02/08/2023  Consent Given By: the patient  Position: PRONE  Additional Comments: Vital signs were monitored before and after the procedure. Patient was prepped and draped in the usual sterile fashion. The correct patient, procedure, and site was verified.   Injection Procedure Details:    Procedure diagnoses: Lumbar radiculopathy [M54.16]    Meds Administered:  Meds ordered this encounter  Medications   methylPREDNISolone acetate (DEPO-MEDROL) injection 80 mg    Laterality: Right  Location/Site: L5  Needle:5.0 in., 22 ga.  Short bevel or Quincke spinal needle  Needle Placement: Transforaminal  Findings:    -Comments: Excellent flow of contrast along the nerve, nerve root and into the epidural space.  Based on the numbering scheme a dictated by the radiologist the L5 foramen is going to look like the L4 foramen on fluoroscopy and plain x-ray.  She essentially has a fully lumbarized S1 segment.  Procedure Details: After squaring off the end-plates to get a true AP view, the C-arm was positioned so that an oblique view of the foramen as noted above was visualized. The target area is just inferior to the "nose of the scotty dog" or sub pedicular. The soft tissues overlying this structure were infiltrated with 2-3 ml. of 1% Lidocaine without Epinephrine.  The spinal needle was inserted toward the target using a "trajectory" view along the fluoroscope beam.  Under AP and lateral visualization, the needle was advanced so it did not puncture dura and was located close the 6 O'Clock position of the pedical in AP tracterory. Biplanar projections were used to confirm position. Aspiration was confirmed to be negative for CSF and/or blood. A 1-2 ml. volume of Isovue-250 was injected and flow of contrast was noted at each level. Radiographs were obtained for documentation purposes.   After attaining the desired flow of contrast documented above, a 0.5 to 1.0  ml test dose of 0.25% Marcaine was injected into each respective transforaminal space.  The patient was observed for 90 seconds post injection.  After no sensory deficits were reported, and normal lower extremity motor function was noted,   the above injectate was administered so that equal amounts of the injectate were placed at  each foramen (level) into the transforaminal epidural space.   Additional Comments:  No complications occurred Dressing: 2 x 2 sterile gauze and Band-Aid    Post-procedure details: Patient was observed during the procedure. Post-procedure instructions were reviewed.  Patient left the clinic in stable condition.    Clinical History: MRI LUMBAR SPINE WITHOUT CONTRAST   TECHNIQUE: Multiplanar, multisequence MR imaging of the lumbar spine was performed. No intravenous contrast was administered.   COMPARISON:  Lumbar MRI 10/27/2019   FINDINGS: Segmentation: S1 is a fully lumbarized vertebral body. There is a normal disc space at S1-2. Numbering consistent with the prior MRI report.   Alignment:  Mild retrolisthesis L3-4, L4-5, L5-S1.   Vertebrae:  Normal bone marrow.  Negative for fracture or mass.   Conus medullaris and cauda equina: Conus extends to the L1-2 level. Conus and cauda equina appear normal.   Paraspinal and other soft tissues: Negative for paraspinous mass or adenopathy.   Disc levels:   L1-2: Negative   L2-3: Negative   L3-4: Negative   L4-5: Mild disc bulging with small central disc protrusion unchanged from prior. Mild facet hypertrophy. Negative for spinal or foraminal stenosis.   L5-S1: Shallow broad-based disc protrusion. Central annular fissure with small central disc protrusion unchanged. Bilateral facet and ligamentum flavum hypertrophy. Mild spinal stenosis and moderate subarticular stenosis bilaterally. No interval change.   S1-2: Negative   IMPRESSION: S1 is a fully lumbarized vertebra.   Disc bulging and small central disc protrusion L4-5 unchanged   Shallow central disc protrusion L5-S1 with bilateral facet hypertrophy. Mild spinal stenosis and moderate subarticular stenosis bilaterally, without interval change.     Electronically Signed   By: Marlan Palau M.D.   On: 07/02/2020 15:01     Objective:  VS:  HT:    WT:    BMI:     BP:114/80  HR:74bpm  TEMP: ( )  RESP:  Physical Exam Vitals and nursing note reviewed.  Constitutional:      General: She is not in acute distress.    Appearance: Normal appearance. She is not ill-appearing.  HENT:     Head: Normocephalic and atraumatic.     Right Ear: External ear normal.     Left Ear: External ear normal.  Eyes:     Extraocular Movements: Extraocular movements intact.  Cardiovascular:     Rate and Rhythm: Normal rate.     Pulses: Normal pulses.  Pulmonary:     Effort: Pulmonary effort is normal. No respiratory distress.  Abdominal:     General: There is no distension.     Palpations: Abdomen is soft.  Musculoskeletal:        General: Tenderness present.     Cervical back: Neck supple.     Right lower leg: No edema.     Left lower leg: No edema.     Comments: Patient has good distal strength with no pain over the greater trochanters.  No clonus or focal weakness.  Skin:    Findings: No erythema, lesion or rash.  Neurological:     General: No focal deficit present.     Mental Status: She is alert and oriented  to person, place, and time.     Sensory: No sensory deficit.     Motor: No weakness or abnormal muscle tone.     Coordination: Coordination normal.  Psychiatric:        Mood and Affect: Mood normal.        Behavior: Behavior normal.      Imaging: XR C-ARM NO REPORT  Result Date: 02/08/2023 Please see Notes tab for imaging impression.

## 2023-02-09 NOTE — Procedures (Signed)
Lumbosacral Transforaminal Epidural Steroid Injection - Sub-Pedicular Approach with Fluoroscopic Guidance  Patient: Lindsey Graham      Date of Birth: 1975/12/23 MRN: [id number] PCP: Lavada Mesi, MD      Visit Date: 02/08/2023   Universal Protocol:    Date/Time: 02/08/2023  Consent Given By: the patient  Position: PRONE  Additional Comments: Vital signs were monitored before and after the procedure. Patient was prepped and draped in the usual sterile fashion. The correct patient, procedure, and site was verified.   Injection Procedure Details:   Procedure diagnoses: Lumbar radiculopathy [M54.16]    Meds Administered:  Meds ordered this encounter  Medications   methylPREDNISolone acetate (DEPO-MEDROL) injection 80 mg    Laterality: Right  Location/Site: L5  Needle:5.0 in., 22 ga.  Short bevel or Quincke spinal needle  Needle Placement: Transforaminal  Findings:    -Comments: Excellent flow of contrast along the nerve, nerve root and into the epidural space.  Based on the numbering scheme a dictated by the radiologist the L5 foramen is going to look like the L4 foramen on fluoroscopy and plain x-ray.  She essentially has a fully lumbarized S1 segment.  Procedure Details: After squaring off the end-plates to get a true AP view, the C-arm was positioned so that an oblique view of the foramen as noted above was visualized. The target area is just inferior to the "nose of the scotty dog" or sub pedicular. The soft tissues overlying this structure were infiltrated with 2-3 ml. of 1% Lidocaine without Epinephrine.  The spinal needle was inserted toward the target using a "trajectory" view along the fluoroscope beam.  Under AP and lateral visualization, the needle was advanced so it did not puncture dura and was located close the 6 O'Clock position of the pedical in AP tracterory. Biplanar projections were used to confirm position. Aspiration was confirmed to be negative for CSF  and/or blood. A 1-2 ml. volume of Isovue-250 was injected and flow of contrast was noted at each level. Radiographs were obtained for documentation purposes.   After attaining the desired flow of contrast documented above, a 0.5 to 1.0 ml test dose of 0.25% Marcaine was injected into each respective transforaminal space.  The patient was observed for 90 seconds post injection.  After no sensory deficits were reported, and normal lower extremity motor function was noted,   the above injectate was administered so that equal amounts of the injectate were placed at each foramen (level) into the transforaminal epidural space.   Additional Comments:  No complications occurred Dressing: 2 x 2 sterile gauze and Band-Aid    Post-procedure details: Patient was observed during the procedure. Post-procedure instructions were reviewed.  Patient left the clinic in stable condition.

## 2023-02-18 ENCOUNTER — Other Ambulatory Visit: Payer: Self-pay | Admitting: Orthopedic Surgery

## 2023-02-23 ENCOUNTER — Ambulatory Visit (INDEPENDENT_AMBULATORY_CARE_PROVIDER_SITE_OTHER): Payer: BC Managed Care – PPO | Admitting: Orthopedic Surgery

## 2023-02-23 VITALS — BP 118/87 | HR 83

## 2023-02-23 DIAGNOSIS — M5412 Radiculopathy, cervical region: Secondary | ICD-10-CM

## 2023-02-23 NOTE — Progress Notes (Signed)
Orthopedic Spine Surgery Office Note   Assessment: Patient is a 47 y.o. 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 Otelia Sergeant who presents for routine follow up (~1.5 years from surgery)     Plan: -Patient has had symptoms of radiating arm pain for over six months now and it has not gotten better with naproxen, tylenol, or oral steroids, so recommended MRI of the cervical spine to evaluate for radiculopathy  -Continue with naproxen and tylenol for now -She has had lumbar spine issues in the past that Dr. Otelia Sergeant has seen her for. Injections have continued to be helpful so will continue those as needed   -Patient should return to office in 4 weeks, x-rays at next visit: cervical AP/lateral/flex/ex     Patient expressed understanding of the plan and all questions were answered to the patient's satisfaction.    ___________________________________________________________________________     History:   Patient is a 47 y.o. female who presents today for cervical spine. Patient has a history of C4-6 ACDF with my partner, Dr. Otelia Sergeant, on 08/09/2021. Patient had been doing better when on oral steroids at our last visit, but has noticed worsening left arm pain since the steroid course was completed. She said she has pain from her neck radiating into the left upper extremity.  She feels it along the lateral aspect of the shoulder into the arm, and into the ulnar aspect of the hand.  It goes into the ulnar 2 digits on the left side.  She does not have any pain rating to the right upper extremity.  She also notes that when she touches her left ear that she gets pain near the scapula on the left side.  Naproxen had previously been helpful but is no longer providing her with significant pain relief.  She sometimes notices her hand is weak on the left side if she is using it repetitively.  Pain is generally worse when she is more active and gets better after period of rest.   Treatments tried: Naproxen,  tizanidine, injections, oral steroids    Physical Exam:   General: no acute distress, appears stated age Neurologic: alert, answering questions appropriately, following commands Respiratory: unlabored breathing on room air, symmetric chest rise Psychiatric: appropriate affect, normal cadence to speech     MSK (spine):   -Strength exam                                                   Left                  Right Grip strength                5/5                  5/5 Interosseus                  5/5                  5/5 Wrist extension            5/5                  5/5 Wrist flexion                 5/5  5/5 Elbow flexion                5/5                  5/5 Deltoid                          5/5                  5/5   -Sensory exam                           Sensation intact to light touch in C5-T1 nerve distributions of bilateral upper extremities    -Incision over the anterior neck appears well-healed with no evidence of infection -Hoffman sign: Negative bilaterally -Clonus: No beats bilaterally -Interosseous wasting: None seen -Grip and release test: Negative -Gait: Normal -Spurling: Negative bilaterally   Tinel's at wrist: Negative bilaterally Durkan's: Negative bilaterally   Tinel's at elbow: Positive Tinel's on the left, negative on the right   Imaging: XR of the cervical spine from 08/25/2022 was previously independently reviewed and interpreted, showing C4-6 ACDF with interbody devices in place.  Appears to be fusion mass across the interspaces.  Anterior instrumentation present with no lucencey. Screws have not backed out.  No fracture or dislocation. Unchanged alignment from films in 06/2022.  EMG/NCS from 09/16/2021 did not show any evidence of radiculopathy or peripheral nerve entrapment     Patient name: Lindsey Graham Patient MRN: [id number] Date of visit: 02/23/23

## 2023-03-22 ENCOUNTER — Other Ambulatory Visit: Payer: Self-pay

## 2023-03-22 ENCOUNTER — Ambulatory Visit (INDEPENDENT_AMBULATORY_CARE_PROVIDER_SITE_OTHER): Payer: BC Managed Care – PPO | Admitting: Orthopedic Surgery

## 2023-03-22 DIAGNOSIS — Z981 Arthrodesis status: Secondary | ICD-10-CM | POA: Diagnosis not present

## 2023-03-22 MED ORDER — METHYLPREDNISOLONE 4 MG PO TBPK: ORAL_TABLET | ORAL | 0 refills | Status: DC

## 2023-03-22 MED ORDER — PREGABALIN 75 MG PO CAPS: 75.0000 mg | ORAL_CAPSULE | Freq: Two times a day (BID) | ORAL | 0 refills | Status: DC

## 2023-03-22 NOTE — Progress Notes (Signed)
Orthopedic Spine Surgery Office Note   Assessment: Patient is a 47 y.o. 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 Otelia Sergeant who presents for routine follow up (~1.5 years from surgery)     Plan: Patient has tried naproxen, tizanidine, injections, oral steroids -Prescribed lyrica and medrol doespak to help with her pain -Can continue with tylenol -Activity as tolerated -Patient should return to office in 12 weeks, x-rays at next visit: none     Patient expressed understanding of the plan and all questions were answered to the patient's satisfaction.    ___________________________________________________________________________     History:   Patient is a 47 y.o. female who presents today for cervical spine. Patient has a history of C4-6 ACDF with my partner, Dr. Otelia Sergeant, on 08/09/2021. Patient continues to have pain that radiates from her neck to her left hand.  It goes into the ulnar 2 digits on the left side.  Pain is similar to last on SR.  She says it is tolerable.  She says it becomes worse if she is doing a lot of work, particularly shifts over 10 hours.  She has found Medrol Dosepak helpful in the past.  She tried gabapentin but did not like it because she developed some side effects.  She has never tried Lyrica.  She is not having any pain rating to the right upper extremity.  No issues with fine motor skills or hands.  No unsteadiness with gait.   Treatments tried: naproxen, tizanidine, injections, oral steroids     Physical Exam:   General: no acute distress, appears stated age Neurologic: alert, answering questions appropriately, following commands Respiratory: unlabored breathing on room air, symmetric chest rise Psychiatric: appropriate affect, normal cadence to speech     MSK (spine):   -Strength exam                                                   Left                  Right Grip strength                5/5                  5/5 Interosseus                   5/5                  5/5 Wrist extension            5/5                  5/5 Wrist flexion                 5/5                  5/5 Elbow flexion                5/5                  5/5 Deltoid                          5/5                  5/5   -  Sensory exam                           Sensation intact to light touch in C5-T1 nerve distributions of bilateral upper extremities    -Incision over the anterior neck appears well-healed with no evidence of infection  -Spurling: Negative bilaterally   Tinel's at wrist: Negative bilaterally Durkan's: Negative bilaterally   Tinel's at elbow: Positive Tinel's on the left, negative on the right   Left shoulder exam: No pain to range of motion, negative Jobe, negative belly press, no weakness with external rotation with arm at side, negative Hawkins  Imaging: XR of the cervical spine from 08/25/2022 was previously independently reviewed and interpreted, showing C4-6 ACDF with interbody devices in place.  Appears to be fusion mass across the interspaces.  Anterior instrumentation present with no lucencey. Screws have not backed out.  No fracture or dislocation. Unchanged alignment from films in 06/2022.    EMG/NCS from 09/16/2021 did not show any evidence of radiculopathy or peripheral nerve entrapment   I ordered and now have reviewed the MRI cervical spine from [facility] on 03/15/2023 showed no significant central or foraminal stenosis.     Patient name: Lindsey Graham Patient MRN: [id number] Date of visit: 03/22/23

## 2023-04-03 IMAGING — RF DG CERVICAL SPINE 1V
1 series · 1 of 1 positions shown · non-contrast
Comparison: MRI 06/30/2020

CLINICAL DATA: Needle level placement

EXAM:
DG CERVICAL SPINE - 1 VIEW

[Series 1: run · 1 of 1 slices shown]
[im 1/1]
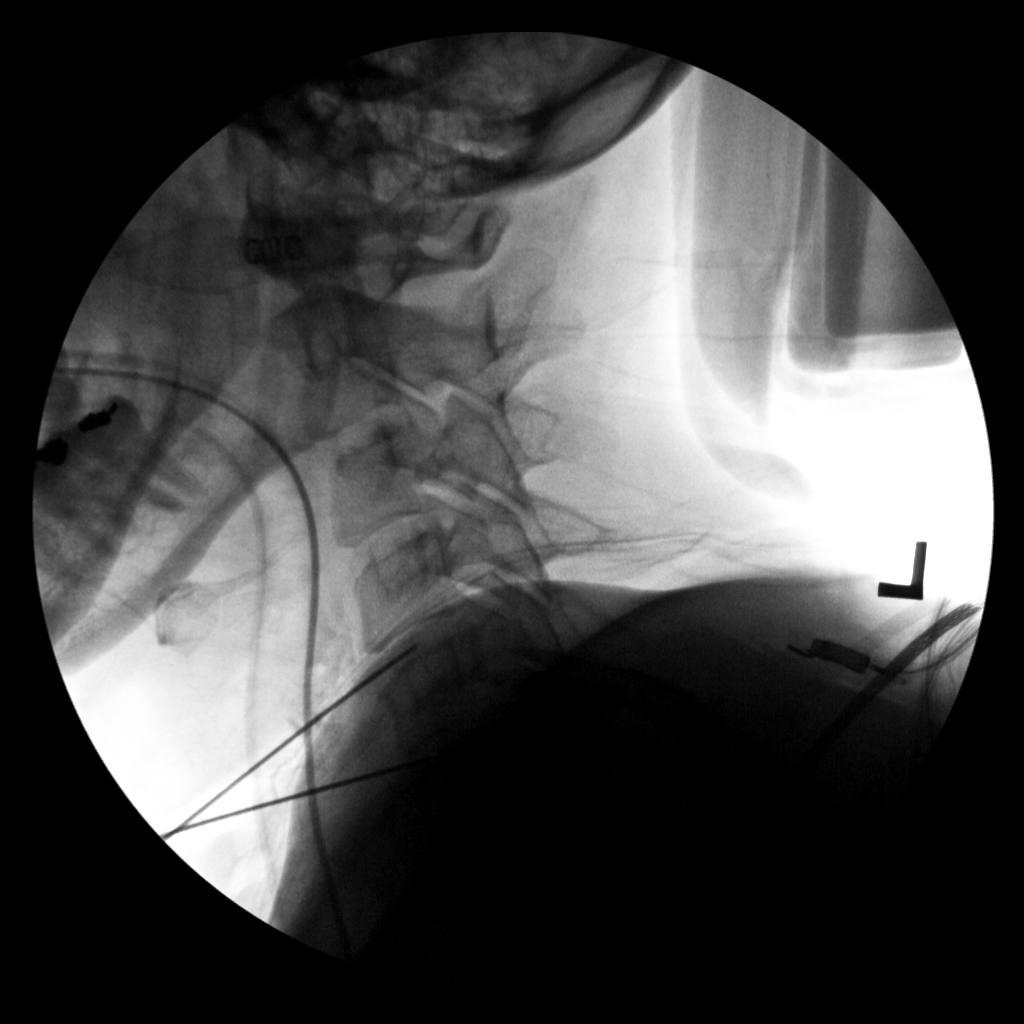

[1 of 1 positions shown; findings below may reference images not displayed]

FINDINGS: Single low resolution intraoperative spot view of the cervical
spine. Localizing needles via anterior approach. The more
cranial/superior needle is positioned over the C4-C5 disc space. The
more inferior caudal needle is positioned over the C5-C6 disc space.
IMPRESSION: Anterior localizing needles at C4-C5 and C5-C6.

Results were called to Natorius in OR 5 at [DATE] p.m. who relayed the
imaging results to Dr. Adlaho.

## 2023-05-09 ENCOUNTER — Telehealth: Payer: Self-pay | Admitting: Physical Medicine and Rehabilitation

## 2023-05-09 NOTE — Telephone Encounter (Signed)
Pt called to set an appt for back injection with Dr Alvester Morin. Please call pt at [phone].

## 2023-05-11 NOTE — Telephone Encounter (Signed)
LVM to return call to get more information 

## 2023-05-15 ENCOUNTER — Other Ambulatory Visit: Payer: Self-pay | Admitting: Orthopedic Surgery

## 2023-05-28 ENCOUNTER — Telehealth: Payer: Self-pay | Admitting: Physical Medicine and Rehabilitation

## 2023-05-28 NOTE — Telephone Encounter (Signed)
Pt called in stating " you can schedule my appt for any Monday around lunch or when we get back from from lunch you can just schedule the appt and i will see it on mychart. If you need to call please do so will most likely be at work but will try my best to answer" please advise message for [facility]

## 2023-05-29 ENCOUNTER — Telehealth: Payer: Self-pay

## 2023-05-29 DIAGNOSIS — M5416 Radiculopathy, lumbar region: Secondary | ICD-10-CM

## 2023-05-29 NOTE — Telephone Encounter (Signed)
MyChart message sent to patient.

## 2023-05-29 NOTE — Telephone Encounter (Signed)
Injection order placed

## 2023-06-18 ENCOUNTER — Other Ambulatory Visit: Payer: Self-pay

## 2023-06-18 ENCOUNTER — Ambulatory Visit: Payer: BC Managed Care – PPO | Admitting: Physical Medicine and Rehabilitation

## 2023-06-18 DIAGNOSIS — M5416 Radiculopathy, lumbar region: Secondary | ICD-10-CM

## 2023-06-18 MED ORDER — METHYLPREDNISOLONE ACETATE 40 MG/ML IJ SUSP
40.0000 mg | Freq: Once | INTRAMUSCULAR | Status: AC
  Administered 2023-06-18: 40 mg

## 2023-06-18 NOTE — Procedures (Signed)
Lumbosacral Transforaminal Epidural Steroid Injection - Sub-Pedicular Approach with Fluoroscopic Guidance  Patient: Lindsey Graham      Date of Birth: 04-14-1976 MRN: [id number] PCP: Lavada Mesi, MD      Visit Date: 06/18/2023   Universal Protocol:    Date/Time: 06/18/2023  Consent Given By: the patient  Position: PRONE  Additional Comments: Vital signs were monitored before and after the procedure. Patient was prepped and draped in the usual sterile fashion. The correct patient, procedure, and site was verified.   Injection Procedure Details:   Procedure diagnoses: Lumbar radiculopathy [M54.16]    Meds Administered:  Meds ordered this encounter  Medications   methylPREDNISolone acetate (DEPO-MEDROL) injection 40 mg    Laterality: Right  Location/Site: L5  Needle:5.0 in., 22 ga.  Short bevel or Quincke spinal needle  Needle Placement: Transforaminal  Findings:    -Comments: Excellent flow of contrast along the nerve, nerve root and into the epidural space.  Procedure Details: After squaring off the end-plates to get a true AP view, the C-arm was positioned so that an oblique view of the foramen as noted above was visualized. The target area is just inferior to the "nose of the scotty dog" or sub pedicular. The soft tissues overlying this structure were infiltrated with 2-3 ml. of 1% Lidocaine without Epinephrine.  The spinal needle was inserted toward the target using a "trajectory" view along the fluoroscope beam.  Under AP and lateral visualization, the needle was advanced so it did not puncture dura and was located close the 6 O'Clock position of the pedical in AP tracterory. Biplanar projections were used to confirm position. Aspiration was confirmed to be negative for CSF and/or blood. A 1-2 ml. volume of Isovue-250 was injected and flow of contrast was noted at each level. Radiographs were obtained for documentation purposes.   After attaining the desired flow of  contrast documented above, a 0.5 to 1.0 ml test dose of 0.25% Marcaine was injected into each respective transforaminal space.  The patient was observed for 90 seconds post injection.  After no sensory deficits were reported, and normal lower extremity motor function was noted,   the above injectate was administered so that equal amounts of the injectate were placed at each foramen (level) into the transforaminal epidural space.   Additional Comments:  No complications occurred Dressing: 2 x 2 sterile gauze and Band-Aid    Post-procedure details: Patient was observed during the procedure. Post-procedure instructions were reviewed.  Patient left the clinic in stable condition.

## 2023-06-18 NOTE — Patient Instructions (Signed)

## 2023-06-18 NOTE — Progress Notes (Signed)
Lindsey Graham - 47 y.o. female MRN [id number]  Date of birth: 12-07-1975  Office Visit Note: Visit Date: 06/18/2023 PCP: Lavada Mesi, MD Referred by: London Sheer, MD  Subjective: No chief complaint on file.  HPI:  Lindsey Graham is a 47 y.o. female who comes in today at the request of Dr. Willia Craze for planned Right L5-S1 Lumbar Transforaminal epidural steroid injection with fluoroscopic guidance.  The patient has failed conservative care including home exercise, medications, time and activity modification.  This injection will be diagnostic and hopefully therapeutic.  Please see requesting physician notes for further details and justification. She has completely Lumbarized S1 segment.   ROS Otherwise per HPI.  Assessment & Plan: Visit Diagnoses:    ICD-10-CM   1. Lumbar radiculopathy  M54.16 XR C-ARM NO REPORT    Epidural Steroid injection    methylPREDNISolone acetate (DEPO-MEDROL) injection 40 mg      Plan: No additional findings.   Meds & Orders:  Meds ordered this encounter  Medications   methylPREDNISolone acetate (DEPO-MEDROL) injection 40 mg    Orders Placed This Encounter  Procedures   XR C-ARM NO REPORT   Epidural Steroid injection    Follow-up: Return for visit to requesting provider as needed.   Procedures: No procedures performed  Lumbosacral Transforaminal Epidural Steroid Injection - Sub-Pedicular Approach with Fluoroscopic Guidance  Patient: Lindsey Graham      Date of Birth: 03/15/1976 MRN: [id number] PCP: Lavada Mesi, MD      Visit Date: 06/18/2023   Universal Protocol:    Date/Time: 06/18/2023  Consent Given By: the patient  Position: PRONE  Additional Comments: Vital signs were monitored before and after the procedure. Patient was prepped and draped in the usual sterile fashion. The correct patient, procedure, and site was verified.   Injection Procedure Details:   Procedure diagnoses: Lumbar radiculopathy [M54.16]    Meds  Administered:  Meds ordered this encounter  Medications   methylPREDNISolone acetate (DEPO-MEDROL) injection 40 mg    Laterality: Right  Location/Site: L5  Needle:5.0 in., 22 ga.  Short bevel or Quincke spinal needle  Needle Placement: Transforaminal  Findings:    -Comments: Excellent flow of contrast along the nerve, nerve root and into the epidural space.  Procedure Details: After squaring off the end-plates to get a true AP view, the C-arm was positioned so that an oblique view of the foramen as noted above was visualized. The target area is just inferior to the "nose of the scotty dog" or sub pedicular. The soft tissues overlying this structure were infiltrated with 2-3 ml. of 1% Lidocaine without Epinephrine.  The spinal needle was inserted toward the target using a "trajectory" view along the fluoroscope beam.  Under AP and lateral visualization, the needle was advanced so it did not puncture dura and was located close the 6 O'Clock position of the pedical in AP tracterory. Biplanar projections were used to confirm position. Aspiration was confirmed to be negative for CSF and/or blood. A 1-2 ml. volume of Isovue-250 was injected and flow of contrast was noted at each level. Radiographs were obtained for documentation purposes.   After attaining the desired flow of contrast documented above, a 0.5 to 1.0 ml test dose of 0.25% Marcaine was injected into each respective transforaminal space.  The patient was observed for 90 seconds post injection.  After no sensory deficits were reported, and normal lower extremity motor function was noted,   the above injectate was administered so that equal amounts of  the injectate were placed at each foramen (level) into the transforaminal epidural space.   Additional Comments:  No complications occurred Dressing: 2 x 2 sterile gauze and Band-Aid    Post-procedure details: Patient was observed during the procedure. Post-procedure instructions  were reviewed.  Patient left the clinic in stable condition.    Clinical History: MRI LUMBAR SPINE WITHOUT CONTRAST   TECHNIQUE: Multiplanar, multisequence MR imaging of the lumbar spine was performed. No intravenous contrast was administered.   COMPARISON:  Lumbar MRI 10/27/2019   FINDINGS: Segmentation: S1 is a fully lumbarized vertebral body. There is a normal disc space at S1-2. Numbering consistent with the prior MRI report.   Alignment:  Mild retrolisthesis L3-4, L4-5, L5-S1.   Vertebrae:  Normal bone marrow.  Negative for fracture or mass.   Conus medullaris and cauda equina: Conus extends to the L1-2 level. Conus and cauda equina appear normal.   Paraspinal and other soft tissues: Negative for paraspinous mass or adenopathy.   Disc levels:   L1-2: Negative   L2-3: Negative   L3-4: Negative   L4-5: Mild disc bulging with small central disc protrusion unchanged from prior. Mild facet hypertrophy. Negative for spinal or foraminal stenosis.   L5-S1: Shallow broad-based disc protrusion. Central annular fissure with small central disc protrusion unchanged. Bilateral facet and ligamentum flavum hypertrophy. Mild spinal stenosis and moderate subarticular stenosis bilaterally. No interval change.   S1-2: Negative   IMPRESSION: S1 is a fully lumbarized vertebra.   Disc bulging and small central disc protrusion L4-5 unchanged   Shallow central disc protrusion L5-S1 with bilateral facet hypertrophy. Mild spinal stenosis and moderate subarticular stenosis bilaterally, without interval change.     Electronically Signed   By: Marlan Palau M.D.   On: 07/02/2020 15:01     Objective:  VS:  HT:    WT:   BMI:     BP:   HR: bpm  TEMP: ( )  RESP:  Physical Exam Vitals and nursing note reviewed.  Constitutional:      General: She is not in acute distress.    Appearance: Normal appearance. She is not ill-appearing.  HENT:     Head: Normocephalic and  atraumatic.     Right Ear: External ear normal.     Left Ear: External ear normal.  Eyes:     Extraocular Movements: Extraocular movements intact.  Cardiovascular:     Rate and Rhythm: Normal rate.     Pulses: Normal pulses.  Pulmonary:     Effort: Pulmonary effort is normal. No respiratory distress.  Abdominal:     General: There is no distension.     Palpations: Abdomen is soft.  Musculoskeletal:        General: Tenderness present.     Cervical back: Neck supple.     Right lower leg: No edema.     Left lower leg: No edema.     Comments: Patient has good distal strength with no pain over the greater trochanters.  No clonus or focal weakness.  Skin:    Findings: No erythema, lesion or rash.  Neurological:     General: No focal deficit present.     Mental Status: She is alert and oriented to person, place, and time.     Sensory: No sensory deficit.     Motor: No weakness or abnormal muscle tone.     Coordination: Coordination normal.  Psychiatric:        Mood and Affect: Mood normal.  Behavior: Behavior normal.      Imaging: No results found.

## 2023-06-22 ENCOUNTER — Ambulatory Visit: Payer: BC Managed Care – PPO | Admitting: Orthopedic Surgery

## 2023-06-28 ENCOUNTER — Ambulatory Visit: Payer: BC Managed Care – PPO | Admitting: Orthopedic Surgery

## 2023-06-28 DIAGNOSIS — M5416 Radiculopathy, lumbar region: Secondary | ICD-10-CM | POA: Diagnosis not present

## 2023-06-28 MED ORDER — TIZANIDINE HCL 4 MG PO TABS: 4.0000 mg | ORAL_TABLET | Freq: Four times a day (QID) | ORAL | 1 refills | Status: DC | PRN

## 2023-06-28 MED ORDER — METHYLPREDNISOLONE 4 MG PO TBPK: ORAL_TABLET | ORAL | 0 refills | Status: DC

## 2023-06-28 MED ORDER — SERTRALINE HCL 50 MG PO TABS: ORAL_TABLET | ORAL | 1 refills | Status: DC

## 2023-06-28 NOTE — Progress Notes (Signed)
Orthopedic Spine Surgery Office Note   Assessment: Patient is a 47 y.o. 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 Otelia Sergeant who presents for routine follow up (~1.5 years from surgery)     Plan: Patient has tried naproxen, tizanidine, injections, oral steroids, gabapentin, lyrica -She had a side effect with Lyrica so will not use that in the future -Medrol Dosepak and tizanidine prescribed to her today since she has found these medications helpful -Since injections have been helpful for he radicular lumbar pain, will consider trying those again if pain returns -Refilled her zoloft while she looks for a primary care provider -Patient should return to office in 3 months, x-rays at next visit: none     Patient expressed understanding of the plan and all questions were answered to the patient's satisfaction.    ___________________________________________________________________________     History:   Patient is a 47 y.o. female who presents today for her lumbar spine.  Patient has had low back pain that radiates into the right buttock and right lateral thigh.  She recently got an injection with Dr. Alvester Morin to her right L5.  She noted significant relief that she rates at about 75% since that injection.  She is not having any pain rating to the left lower extremity.  She has recently switched jobs and feels that this job change is less stressful.  Her pain is currently tolerable.  She has been happy with using tizanidine and periodic Medrol Dosepak's to control her pain.   Treatments tried: naproxen, tizanidine, injections, oral steroids, gabapentin, lyrica     Physical Exam:   General: no acute distress, appears stated age Neurologic: alert, answering questions appropriately, following commands Respiratory: unlabored breathing on room air, symmetric chest rise Psychiatric: appropriate affect, normal cadence to speech     MSK (spine):   -Strength exam      Left  Right EHL     5/5  5/5 TA    5/5  5/5 GSC    5/5  5/5 Knee extension  5/5  5/5 Hip flexion   5/5  5/5   -Sensory exam                           Sensation intact to light touch in C5-T1 nerve distributions of bilateral upper extremities    -Left hip exam: No pain through range of motion, negative FABER, negative Stinchfield -Right hip exam: No pain through range of motion, negative FABER, negative Stinchfield   Imaging: MRI of the lumbar spine from 07/01/2020 was independently reviewed and interpreted, showing bilateral lateral recess stenosis at L5/S1.  No other significant stenosis seen.  Disc desiccation at L5/S1.     Patient name: Lindsey Graham Patient MRN: [id number] Date of visit: 06/28/23

## 2023-08-23 ENCOUNTER — Other Ambulatory Visit: Payer: Self-pay | Admitting: Orthopedic Surgery

## 2023-09-08 ENCOUNTER — Other Ambulatory Visit: Payer: Self-pay | Admitting: Orthopedic Surgery

## 2023-09-10 MED ORDER — METHYLPREDNISOLONE 4 MG PO TBPK: ORAL_TABLET | ORAL | 0 refills | Status: DC

## 2023-10-30 ENCOUNTER — Other Ambulatory Visit: Payer: Self-pay | Admitting: Family Medicine

## 2023-10-30 NOTE — Patient Instructions (Signed)
 Below is our plan:  We will continue rizatriptan as needed  Please make sure you are staying well hydrated. I recommend 50-60 ounces daily. Well balanced diet and regular exercise encouraged. Consistent sleep schedule with 6-8 hours recommended.   Please continue follow up with care team as directed.   Follow up with me in 1 year   You may receive a survey regarding today's visit. I encourage you to leave honest feed back as I do use this information to improve patient care. Thank you for seeing me today!   GENERAL HEADACHE INFORMATION:   Natural supplements: Magnesium Oxide or Magnesium Glycinate 500 mg at bed (up to 800 mg daily) Coenzyme Q10 300 mg in AM Vitamin B2- 200 mg twice a day   Add 1 supplement at a time since even natural supplements can have undesirable side effects. You can sometimes buy supplements cheaper (especially Coenzyme Q10) at www.WebmailGuide.co.za or at [facility].  Migraine with aura: There is increased risk for stroke in women with migraine with aura and a contraindication for the combined contraceptive pill for use by women who have migraine with aura. The risk for women with migraine without aura is lower. However other risk factors like smoking are far more likely to increase stroke risk than migraine. There is a recommendation for no smoking and for the use of OCPs without estrogen such as progestogen only pills particularly for women with migraine with aura.Marland Kitchen People who have migraine headaches with auras may be 3 times more likely to have a stroke caused by a blood clot, compared to migraine patients who don't see auras. Women who take hormone-replacement therapy may be 30 percent more likely to suffer a clot-based stroke than women not taking medication containing estrogen. Other risk factors like smoking and high blood pressure may be  much more important.    Vitamins and herbs that show potential:   Magnesium: Magnesium (250 mg twice a day or 500 mg at bed) has a  relaxant effect on smooth muscles such as blood vessels. Individuals suffering from frequent or daily headache usually have low magnesium levels which can be increase with daily supplementation of 400-750 mg. Three trials found 40-90% average headache reduction  when used as a preventative. Magnesium may help with headaches are aura, the best evidence for magnesium is for migraine with aura is its thought to stop the cortical spreading depression we believe is the pathophysiology of migraine aura.Magnesium also demonstrated the benefit in menstrually related migraine.  Magnesium is part of the messenger system in the serotonin cascade and it is a good muscle relaxant.  It is also useful for constipation which can be a side effect of other medications used to treat migraine. Good sources include nuts, whole grains, and tomatoes. Side Effects: loose stool/diarrhea  Riboflavin (vitamin B 2) 200 mg twice a day. This vitamin assists nerve cells in the production of ATP a principal energy storing molecule.  It is necessary for many chemical reactions in the body.  There have been at least 3 clinical trials of riboflavin using 400 mg per day all of which suggested that migraine frequency can be decreased.  All 3 trials showed significant improvement in over half of migraine sufferers.  The supplement is found in bread, cereal, milk, meat, and poultry.  Most Americans get more riboflavin than the recommended daily allowance, however riboflavin deficiency is not necessary for the supplements to help prevent headache. Side effects: energizing, green urine   Coenzyme Q10: This is present  in almost all cells in the body and is critical component for the conversion of energy.  Recent studies have shown that a nutritional supplement of CoQ10 can reduce the frequency of migraine attacks by improving the energy production of cells as with riboflavin.  Doses of 150 mg twice a day have been shown to be effective.   Melatonin:  Increasing evidence shows correlation between melatonin secretion and headache conditions.  Melatonin supplementation has decreased headache intensity and duration.  It is widely used as a sleep aid.  Sleep is natures way of dealing with migraine.  A dose of 3 mg is recommended to start for headaches including cluster headache. Higher doses up to 15 mg has been reviewed for use in Cluster headache and have been used. The rationale behind using melatonin for cluster is that many theories regarding the cause of Cluster headache center around the disruption of the normal circadian rhythm in the brain.  This helps restore the normal circadian rhythm.   HEADACHE DIET: Foods and beverages which may trigger migraine Note that only 20% of headache patients are food sensitive. You will know if you are food sensitive if you get a headache consistently 20 minutes to 2 hours after eating a certain food. Only cut out a food if it causes headaches, otherwise you might remove foods you enjoy! What matters most for diet is to eat a well balanced healthy diet full of vegetables and low fat protein, and to not miss meals.   Chocolate, other sweets ALL cheeses except cottage and cream cheese Dairy products, yogurt, sour cream, ice cream Liver Meat extracts (Bovril, Marmite, meat tenderizers) Meats or fish which have undergone aging, fermenting, pickling or smoking. These include: Hotdogs,salami,Lox,sausage, mortadellas,smoked salmon, pepperoni, Pickled herring Pods of broad bean (English beans, Chinese pea pods, [place] (fava) beans, lima and navy beans Ripe avocado, ripe banana Yeast extracts or active yeast preparations such as Brewer's or Fleishman's (commercial bakes goods are permitted) Tomato based foods, pizza (lasagna, etc.)   MSG (monosodium glutamate) is disguised as many things; look for these common aliases: Monopotassium glutamate Autolysed yeast Hydrolysed protein Sodium  caseinate "flavorings" "all natural preservatives" Nutrasweet   Avoid all other foods that convincingly provoke headaches.   Resources: The Dizzy [place]air Laundry Your Headache Diet, migrainestrong.com  https://zamora-andrews.com/   Caffeine and Migraine For patients that have migraine, caffeine intake more than 3 days per week can lead to dependency and increased migraine frequency. I would recommend cutting back on your caffeine intake as best you can. The recommended amount of caffeine is 200-300 mg daily, although migraine patients may experience dependency at even lower doses. While you may notice an increase in headache temporarily, cutting back will be helpful for headaches in the long run. For more information on caffeine and migraine, visit: https://americanmigrainefoundation.org/resource-library/caffeine-and-migraine/   Headache Prevention Strategies:   1. Maintain a headache diary; learn to identify and avoid triggers.  - This can be a simple note where you log when you had a headache, associated symptoms, and medications used - There are several smartphone apps developed to help track migraines: Migraine Buddy, Migraine Monitor, Curelator N1-Headache App   Common triggers include: Emotional triggers: Emotional/Upset family or friends Emotional/Upset occupation Business reversal/success Anticipation anxiety Crisis-serious Post-crisis periodNew job/position   Physical triggers: Vacation Day Weekend Strenuous Exercise High Altitude Location New Move Menstrual Day Physical Illness Oversleep/Not enough sleep Weather changes Light: Photophobia or light sesnitivity treatment involves a balance between desensitization and reduction in overly strong input. Use  dark polarized glasses outside, but not inside. Avoid bright or fluorescent light, but do not dim environment to the point that going into a normally lit room hurts. Consider  FL-41 tint lenses, which reduce the most irritating wavelengths without blocking too much light.  These can be obtained at axonoptics.com or theraspecs.com Foods: see list above.   2. Limit use of acute treatments (over-the-counter medications, triptans, etc.) to no more than 2 days per week or 10 days per month to prevent medication overuse headache (rebound headache).     3. Follow a regular schedule (including weekends and holidays): Don't skip meals. Eat a balanced diet. 8 hours of sleep nightly. Minimize stress. Exercise 30 minutes per day. Being overweight is associated with a 5 times increased risk of chronic migraine. Keep well hydrated and drink 6-8 glasses of water per day.   4. Initiate non-pharmacologic measures at the earliest onset of your headache. Rest and quiet environment. Relax and reduce stress. Breathe2Relax is a free app that can instruct you on    some simple relaxtion and breathing techniques. Http://Dawnbuse.com is a    free website that provides teaching videos on relaxation.  Also, there are  many apps that   can be downloaded for "mindful" relaxation.  An app called YOGA NIDRA will help walk you through mindfulness. Another app called Calm can be downloaded to give you a structured mindfulness guide with daily reminders and skill development. Headspace for guided meditation Mindfulness Based Stress Reduction Online Course: www.palousemindfulness.com Cold compresses.   5. Don't wait!! Take the maximum allowable dosage of prescribed medication at the first sign of migraine.   6. Compliance:  Take prescribed medication regularly as directed and at the first sign of a migraine.   7. Communicate:  Call your physician when problems arise, especially if your headaches change, increase in frequency/severity, or become associated with neurological symptoms (weakness, numbness, slurred speech, etc.). Proceed to emergency room if you experience new or worsening symptoms or  symptoms do not resolve, if you have new neurologic symptoms or if headache is severe, or for any concerning symptom.   8. Headache/pain management therapies: Consider various complementary methods, including medication, behavioral therapy, psychological counselling, biofeedback, massage therapy, acupuncture, dry needling, and other modalities.  Such measures may reduce the need for medications. Counseling for pain management, where patients learn to function and ignore/minimize their pain, seems to work very well.   9. Recommend changing family's attention and focus away from patient's headaches. Instead, emphasize daily activities. If first question of day is 'How are your headaches/Do you have a headache today?', then patient will constantly think about headaches, thus making them worse. Goal is to re-direct attention away from headaches, toward daily activities and other distractions.   10. Helpful Websites: www.AmericanHeadacheSociety.org PatentHood.ch www.headaches.org TightMarket.nl www.achenet.org
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
## 2023-10-31 ENCOUNTER — Ambulatory Visit (INDEPENDENT_AMBULATORY_CARE_PROVIDER_SITE_OTHER): Payer: BC Managed Care – PPO | Admitting: Family Medicine

## 2023-10-31 ENCOUNTER — Encounter: Payer: Self-pay | Admitting: Family Medicine

## 2023-10-31 VITALS — BP 111/77 | Resp 15 | Ht 66.5 in | Wt 173.0 lb

## 2023-10-31 DIAGNOSIS — G44329 Chronic post-traumatic headache, not intractable: Secondary | ICD-10-CM

## 2023-10-31 MED ORDER — RIZATRIPTAN BENZOATE 10 MG PO TABS: 10.0000 mg | ORAL_TABLET | ORAL | 11 refills | Status: DC | PRN

## 2023-11-01 ENCOUNTER — Other Ambulatory Visit: Payer: Self-pay | Admitting: Family Medicine

## 2023-11-23 ENCOUNTER — Other Ambulatory Visit: Payer: Self-pay | Admitting: Orthopedic Surgery

## 2023-11-25 ENCOUNTER — Other Ambulatory Visit: Payer: Self-pay | Admitting: Orthopedic Surgery

## 2023-11-28 ENCOUNTER — Telehealth: Payer: Self-pay | Admitting: Physical Medicine and Rehabilitation

## 2023-11-28 NOTE — Telephone Encounter (Signed)
 Pt called requesting a call to set an appt for back injection. Please call pt at [phone].

## 2023-11-29 ENCOUNTER — Other Ambulatory Visit: Payer: Self-pay | Admitting: Physical Medicine and Rehabilitation

## 2023-11-29 DIAGNOSIS — M5416 Radiculopathy, lumbar region: Secondary | ICD-10-CM

## 2023-12-12 ENCOUNTER — Ambulatory Visit: Admitting: Physical Medicine and Rehabilitation

## 2023-12-12 ENCOUNTER — Other Ambulatory Visit: Payer: Self-pay

## 2023-12-12 VITALS — BP 113/77 | HR 97

## 2023-12-12 DIAGNOSIS — M5416 Radiculopathy, lumbar region: Secondary | ICD-10-CM | POA: Diagnosis not present

## 2023-12-12 MED ORDER — METHYLPREDNISOLONE ACETATE 40 MG/ML IJ SUSP
40.0000 mg | Freq: Once | INTRAMUSCULAR | Status: AC
  Administered 2023-12-12: 40 mg

## 2023-12-12 NOTE — Progress Notes (Signed)
 Lindsey Graham - 48 y.o. female MRN [id number]  Date of birth: July 12, 1976  Office Visit Note: Visit Date: 12/12/2023 PCP: Rey Catholic, MD Referred by: Diedra Fowler, MD  Subjective: Chief Complaint  Patient presents with   Lower Back - Pain   HPI:  Lindsey Graham is a 48 y.o. female who comes in today for planned repeat Right L5-S1  Lumbar Transforaminal epidural steroid injection with fluoroscopic guidance.  The patient has failed conservative care including home exercise, medications, time and activity modification.  This injection will be diagnostic and hopefully therapeutic.  Please see requesting physician notes for further details and justification. Patient received more than 50% pain relief from prior injection.  Based on MRI of the lumbar spine from 2021 she has a fully lumbarized S1 segment.  Using that numbering scheme we have been injecting the L5 foramen but on fluoroscopy does look like L4 from a normal anatomy standpoint.  Also recommendation would be to update lumbar spine MRI imaging at some point.  He should continue to follow with Dr. Sulema Endo.  Referring: Elvan Hamel, FNP and Dr. Colette Davies   ROS Otherwise per HPI.  Assessment & Plan: Visit Diagnoses:    ICD-10-CM   1. Lumbar radiculopathy  M54.16 XR C-ARM NO REPORT    Epidural Steroid injection    methylPREDNISolone  acetate (DEPO-MEDROL ) injection 40 mg      Plan: No additional findings.   Meds & Orders:  Meds ordered this encounter  Medications   methylPREDNISolone  acetate (DEPO-MEDROL ) injection 40 mg    Orders Placed This Encounter  Procedures   XR C-ARM NO REPORT   Epidural Steroid injection    Follow-up: Return for visit to requesting provider as needed.   Procedures: No procedures performed  Lumbosacral Transforaminal Epidural Steroid Injection - Sub-Pedicular Approach with Fluoroscopic Guidance  Patient: Steele Edelson      Date of Birth: Nov 25, 1975 MRN: [id number] PCP: Rey Catholic,  MD      Visit Date: 12/12/2023   Universal Protocol:    Date/Time: 12/12/2023  Consent Given By: the patient  Position: PRONE  Additional Comments: Vital signs were monitored before and after the procedure. Patient was prepped and draped in the usual sterile fashion. The correct patient, procedure, and site was verified.   Injection Procedure Details:   Procedure diagnoses: Lumbar radiculopathy [M54.16]    Meds Administered:  Meds ordered this encounter  Medications   methylPREDNISolone  acetate (DEPO-MEDROL ) injection 40 mg    Laterality: Right  Location/Site: L5  Needle:5.0 in., 22 ga.  Short bevel or Quincke spinal needle  Needle Placement: Transforaminal  Findings:    -Comments: Excellent flow of contrast along the nerve, nerve root and into the epidural space.  Procedure Details: After squaring off the end-plates to get a true AP view, the C-arm was positioned so that an oblique view of the foramen as noted above was visualized. The target area is just inferior to the "nose of the scotty dog" or sub pedicular. The soft tissues overlying this structure were infiltrated with 2-3 ml. of 1% Lidocaine  without Epinephrine.  The spinal needle was inserted toward the target using a "trajectory" view along the fluoroscope beam.  Under AP and lateral visualization, the needle was advanced so it did not puncture dura and was located close the 6 O'Clock position of the pedical in AP tracterory. Biplanar projections were used to confirm position. Aspiration was confirmed to be negative for CSF and/or blood. A 1-2 ml. volume of Isovue -250 was injected  and flow of contrast was noted at each level. Radiographs were obtained for documentation purposes.   After attaining the desired flow of contrast documented above, a 0.5 to 1.0 ml test dose of 0.25% Marcaine  was injected into each respective transforaminal space.  The patient was observed for 90 seconds post injection.  After no  sensory deficits were reported, and normal lower extremity motor function was noted,   the above injectate was administered so that equal amounts of the injectate were placed at each foramen (level) into the transforaminal epidural space.   Additional Comments:  The patient tolerated the procedure well Dressing: 2 x 2 sterile gauze and Band-Aid    Post-procedure details: Patient was observed during the procedure. Post-procedure instructions were reviewed.  Patient left the clinic in stable condition.    Clinical History: MRI LUMBAR SPINE WITHOUT CONTRAST   TECHNIQUE: Multiplanar, multisequence MR imaging of the lumbar spine was performed. No intravenous contrast was administered.   COMPARISON:  Lumbar MRI 10/27/2019   FINDINGS: Segmentation: S1 is a fully lumbarized vertebral body. There is a normal disc space at S1-2. Numbering consistent with the prior MRI report.   Alignment:  Mild retrolisthesis L3-4, L4-5, L5-S1.   Vertebrae:  Normal bone marrow.  Negative for fracture or mass.   Conus medullaris and cauda equina: Conus extends to the L1-2 level. Conus and cauda equina appear normal.   Paraspinal and other soft tissues: Negative for paraspinous mass or adenopathy.   Disc levels:   L1-2: Negative   L2-3: Negative   L3-4: Negative   L4-5: Mild disc bulging with small central disc protrusion unchanged from prior. Mild facet hypertrophy. Negative for spinal or foraminal stenosis.   L5-S1: Shallow broad-based disc protrusion. Central annular fissure with small central disc protrusion unchanged. Bilateral facet and ligamentum flavum hypertrophy. Mild spinal stenosis and moderate subarticular stenosis bilaterally. No interval change.   S1-2: Negative   IMPRESSION: S1 is a fully lumbarized vertebra.   Disc bulging and small central disc protrusion L4-5 unchanged   Shallow central disc protrusion L5-S1 with bilateral facet hypertrophy. Mild spinal stenosis  and moderate subarticular stenosis bilaterally, without interval change.     Electronically Signed   By: Anastasio Balsam M.D.   On: 07/02/2020 15:01     Objective:  VS:  HT:    WT:   BMI:     BP:113/77  HR:97bpm  TEMP: ( )  RESP:  Physical Exam Vitals and nursing note reviewed.  Constitutional:      General: She is not in acute distress.    Appearance: Normal appearance. She is not ill-appearing.  HENT:     Head: Normocephalic and atraumatic.     Right Ear: External ear normal.     Left Ear: External ear normal.  Eyes:     Extraocular Movements: Extraocular movements intact.  Cardiovascular:     Rate and Rhythm: Normal rate.     Pulses: Normal pulses.  Pulmonary:     Effort: Pulmonary effort is normal. No respiratory distress.  Abdominal:     General: There is no distension.     Palpations: Abdomen is soft.  Musculoskeletal:        General: Tenderness present.     Cervical back: Neck supple.     Right lower leg: No edema.     Left lower leg: No edema.     Comments: Patient has good distal strength with no pain over the greater trochanters.  No clonus or focal weakness.  Skin:    Findings: No erythema, lesion or rash.  Neurological:     General: No focal deficit present.     Mental Status: She is alert and oriented to person, place, and time.     Sensory: No sensory deficit.     Motor: No weakness or abnormal muscle tone.     Coordination: Coordination normal.  Psychiatric:        Mood and Affect: Mood normal.        Behavior: Behavior normal.      Imaging: No results found.

## 2023-12-12 NOTE — Patient Instructions (Signed)

## 2023-12-12 NOTE — Progress Notes (Signed)
 Pain Scale   Average Pain 8 Patient advising she has lower back pain advising that pain radiates bilaterally hips. Patient advised that she has increased pain when sitting and standing for a long period of time.         +Driver, -BT, -Dye Allergies.

## 2023-12-12 NOTE — Procedures (Signed)
 Lumbosacral Transforaminal Epidural Steroid Injection - Sub-Pedicular Approach with Fluoroscopic Guidance  Patient: Lindsey Graham      Date of Birth: 1975/09/20 MRN: [id number] PCP: Rey Catholic, MD      Visit Date: 12/12/2023   Universal Protocol:    Date/Time: 12/12/2023  Consent Given By: the patient  Position: PRONE  Additional Comments: Vital signs were monitored before and after the procedure. Patient was prepped and draped in the usual sterile fashion. The correct patient, procedure, and site was verified.   Injection Procedure Details:   Procedure diagnoses: Lumbar radiculopathy [M54.16]    Meds Administered:  Meds ordered this encounter  Medications   methylPREDNISolone  acetate (DEPO-MEDROL ) injection 40 mg    Laterality: Right  Location/Site: L5, she has a fully lumbarized S1 segment based on MRI from 2021 numbering scheme.  Needle:5.0 in., 22 ga.  Short bevel or Quincke spinal needle  Needle Placement: Transforaminal  Findings:    -Comments: Excellent flow of contrast along the nerve, nerve root and into the epidural space.  Procedure Details: After squaring off the end-plates to get a true AP view, the C-arm was positioned so that an oblique view of the foramen as noted above was visualized. The target area is just inferior to the "nose of the scotty dog" or sub pedicular. The soft tissues overlying this structure were infiltrated with 2-3 ml. of 1% Lidocaine  without Epinephrine.  The spinal needle was inserted toward the target using a "trajectory" view along the fluoroscope beam.  Under AP and lateral visualization, the needle was advanced so it did not puncture dura and was located close the 6 O'Clock position of the pedical in AP tracterory. Biplanar projections were used to confirm position. Aspiration was confirmed to be negative for CSF and/or blood. A 1-2 ml. volume of Isovue -250 was injected and flow of contrast was noted at each level. Radiographs were  obtained for documentation purposes.   After attaining the desired flow of contrast documented above, a 0.5 to 1.0 ml test dose of 0.25% Marcaine  was injected into each respective transforaminal space.  The patient was observed for 90 seconds post injection.  After no sensory deficits were reported, and normal lower extremity motor function was noted,   the above injectate was administered so that equal amounts of the injectate were placed at each foramen (level) into the transforaminal epidural space.   Additional Comments:  The patient tolerated the procedure well Dressing: 2 x 2 sterile gauze and Band-Aid    Post-procedure details: Patient was observed during the procedure. Post-procedure instructions were reviewed.  Patient left the clinic in stable condition.

## 2023-12-26 ENCOUNTER — Other Ambulatory Visit: Payer: Self-pay | Admitting: Orthopedic Surgery

## 2024-02-21 ENCOUNTER — Other Ambulatory Visit: Payer: Self-pay | Admitting: Orthopedic Surgery

## 2024-03-11 ENCOUNTER — Other Ambulatory Visit: Payer: Self-pay | Admitting: Physical Medicine and Rehabilitation

## 2024-03-11 DIAGNOSIS — M5416 Radiculopathy, lumbar region: Secondary | ICD-10-CM

## 2024-04-07 ENCOUNTER — Other Ambulatory Visit: Payer: Self-pay

## 2024-04-07 ENCOUNTER — Ambulatory Visit: Admitting: Physical Medicine and Rehabilitation

## 2024-04-07 VITALS — BP 107/73 | HR 88

## 2024-04-07 DIAGNOSIS — M5416 Radiculopathy, lumbar region: Secondary | ICD-10-CM | POA: Diagnosis not present

## 2024-04-07 MED ORDER — METHYLPREDNISOLONE ACETATE 40 MG/ML IJ SUSP
40.0000 mg | Freq: Once | INTRAMUSCULAR | Status: AC
  Administered 2024-04-07: 40 mg

## 2024-04-07 NOTE — Progress Notes (Signed)
 Pain Scale   Average Pain 7 Patient advising she has chronic lower back pain radiating to her right leg and pain is constant.          +Driver, -BT, -Dye Allergies.

## 2024-04-07 NOTE — Progress Notes (Signed)
 Lindsey Graham - 48 y.o. female MRN [id number]  Date of birth: 1976/05/22  Office Visit Note: Visit Date: 04/07/2024 PCP: Hughie Sharper, MD Referred by: Hughie Sharper, MD  Subjective: Chief Complaint  Patient presents with   Lower Back - Pain   HPI:  Carina Chaplin is a 48 y.o. female who comes in today for planned repeat Right L5-S1  Lumbar Transforaminal epidural steroid injection with fluoroscopic guidance.  The patient has failed conservative care including home exercise, medications, time and activity modification.  This injection will be diagnostic and hopefully therapeutic.  Please see requesting physician notes for further details and justification. Patient received more than 50% pain relief from prior injection.  Consider updating MRI of the lumbar spine.  Referring: Duwaine Pouch, FNP and Dr. Sharper Ada   ROS Otherwise per HPI.  Assessment & Plan: Visit Diagnoses:    ICD-10-CM   1. Lumbar radiculopathy  M54.16 XR C-ARM NO REPORT    Epidural Steroid injection    methylPREDNISolone  acetate (DEPO-MEDROL ) injection 40 mg      Plan: No additional findings.   Meds & Orders:  Meds ordered this encounter  Medications   methylPREDNISolone  acetate (DEPO-MEDROL ) injection 40 mg    Orders Placed This Encounter  Procedures   XR C-ARM NO REPORT   Epidural Steroid injection    Follow-up: Return for visit to requesting provider as needed.   Procedures: No procedures performed  Lumbosacral Transforaminal Epidural Steroid Injection - Sub-Pedicular Approach with Fluoroscopic Guidance  Patient: Bascom Meres      Date of Birth: 1975-09-17 MRN: [id number] PCP: Hughie Sharper, MD      Visit Date: 04/07/2024   Universal Protocol:    Date/Time: 04/07/2024  Consent Given By: the patient  Position: PRONE  Additional Comments: Vital signs were monitored before and after the procedure. Patient was prepped and draped in the usual sterile fashion. The correct patient, procedure,  and site was verified.   Injection Procedure Details:   Procedure diagnoses: Lumbar radiculopathy [M54.16]    Meds Administered:  Meds ordered this encounter  Medications   methylPREDNISolone  acetate (DEPO-MEDROL ) injection 40 mg    Laterality: Right  Location/Site: L5, she essentially has fully lumbarized S1 transitional segment.  Needle:5.0 in., 22 ga.  Short bevel or Quincke spinal needle  Needle Placement: Transforaminal  Findings:    -Comments: Excellent flow of contrast along the nerve, nerve root and into the epidural space.  Procedure Details: After squaring off the end-plates to get a true AP view, the C-arm was positioned so that an oblique view of the foramen as noted above was visualized. The target area is just inferior to the nose of the scotty dog or sub pedicular. The soft tissues overlying this structure were infiltrated with 2-3 ml. of 1% Lidocaine  without Epinephrine.  The spinal needle was inserted toward the target using a trajectory view along the fluoroscope beam.  Under AP and lateral visualization, the needle was advanced so it did not puncture dura and was located close the 6 O'Clock position of the pedical in AP tracterory. Biplanar projections were used to confirm position. Aspiration was confirmed to be negative for CSF and/or blood. A 1-2 ml. volume of Isovue -250 was injected and flow of contrast was noted at each level. Radiographs were obtained for documentation purposes.   After attaining the desired flow of contrast documented above, a 0.5 to 1.0 ml test dose of 0.25% Marcaine  was injected into each respective transforaminal space.  The patient was observed for  90 seconds post injection.  After no sensory deficits were reported, and normal lower extremity motor function was noted,   the above injectate was administered so that equal amounts of the injectate were placed at each foramen (level) into the transforaminal epidural space.   Additional  Comments:  The patient tolerated the procedure well Dressing: 2 x 2 sterile gauze and Band-Aid    Post-procedure details: Patient was observed during the procedure. Post-procedure instructions were reviewed.  Patient left the clinic in stable condition.    Clinical History: MRI LUMBAR SPINE WITHOUT CONTRAST   TECHNIQUE: Multiplanar, multisequence MR imaging of the lumbar spine was performed. No intravenous contrast was administered.   COMPARISON:  Lumbar MRI 10/27/2019   FINDINGS: Segmentation: S1 is a fully lumbarized vertebral body. There is a normal disc space at S1-2. Numbering consistent with the prior MRI report.   Alignment:  Mild retrolisthesis L3-4, L4-5, L5-S1.   Vertebrae:  Normal bone marrow.  Negative for fracture or mass.   Conus medullaris and cauda equina: Conus extends to the L1-2 level. Conus and cauda equina appear normal.   Paraspinal and other soft tissues: Negative for paraspinous mass or adenopathy.   Disc levels:   L1-2: Negative   L2-3: Negative   L3-4: Negative   L4-5: Mild disc bulging with small central disc protrusion unchanged from prior. Mild facet hypertrophy. Negative for spinal or foraminal stenosis.   L5-S1: Shallow broad-based disc protrusion. Central annular fissure with small central disc protrusion unchanged. Bilateral facet and ligamentum flavum hypertrophy. Mild spinal stenosis and moderate subarticular stenosis bilaterally. No interval change.   S1-2: Negative   IMPRESSION: S1 is a fully lumbarized vertebra.   Disc bulging and small central disc protrusion L4-5 unchanged   Shallow central disc protrusion L5-S1 with bilateral facet hypertrophy. Mild spinal stenosis and moderate subarticular stenosis bilaterally, without interval change.     Electronically Signed   By: Carlin Gaskins M.D.   On: 07/02/2020 15:01     Objective:  VS:  HT:    WT:   BMI:     BP:107/73  HR:88bpm  TEMP: ( )  RESP:  Physical  Exam Vitals and nursing note reviewed.  Constitutional:      General: She is not in acute distress.    Appearance: Normal appearance. She is not ill-appearing.  HENT:     Head: Normocephalic and atraumatic.     Right Ear: External ear normal.     Left Ear: External ear normal.  Eyes:     Extraocular Movements: Extraocular movements intact.  Cardiovascular:     Rate and Rhythm: Normal rate.     Pulses: Normal pulses.  Pulmonary:     Effort: Pulmonary effort is normal. No respiratory distress.  Abdominal:     General: There is no distension.     Palpations: Abdomen is soft.  Musculoskeletal:        General: Tenderness present.     Cervical back: Neck supple.     Right lower leg: No edema.     Left lower leg: No edema.     Comments: Patient has good distal strength with no pain over the greater trochanters.  No clonus or focal weakness.  Skin:    Findings: No erythema, lesion or rash.  Neurological:     General: No focal deficit present.     Mental Status: She is alert and oriented to person, place, and time.     Sensory: No sensory deficit.  Motor: No weakness or abnormal muscle tone.     Coordination: Coordination normal.  Psychiatric:        Mood and Affect: Mood normal.        Behavior: Behavior normal.      Imaging: XR C-ARM NO REPORT Result Date: 04/07/2024 Please see Notes tab for imaging impression.

## 2024-04-07 NOTE — Procedures (Signed)
 Lumbosacral Transforaminal Epidural Steroid Injection - Sub-Pedicular Approach with Fluoroscopic Guidance  Patient: Lindsey Graham      Date of Birth: 1976-07-14 MRN: [id number] PCP: Hughie Sharper, MD      Visit Date: 04/07/2024   Universal Protocol:    Date/Time: 04/07/2024  Consent Given By: the patient  Position: PRONE  Additional Comments: Vital signs were monitored before and after the procedure. Patient was prepped and draped in the usual sterile fashion. The correct patient, procedure, and site was verified.   Injection Procedure Details:   Procedure diagnoses: Lumbar radiculopathy [M54.16]    Meds Administered:  Meds ordered this encounter  Medications   methylPREDNISolone  acetate (DEPO-MEDROL ) injection 40 mg    Laterality: Right  Location/Site: L5, she essentially has fully lumbarized S1 transitional segment.  Needle:5.0 in., 22 ga.  Short bevel or Quincke spinal needle  Needle Placement: Transforaminal  Findings:    -Comments: Excellent flow of contrast along the nerve, nerve root and into the epidural space.  Procedure Details: After squaring off the end-plates to get a true AP view, the C-arm was positioned so that an oblique view of the foramen as noted above was visualized. The target area is just inferior to the nose of the scotty dog or sub pedicular. The soft tissues overlying this structure were infiltrated with 2-3 ml. of 1% Lidocaine  without Epinephrine.  The spinal needle was inserted toward the target using a trajectory view along the fluoroscope beam.  Under AP and lateral visualization, the needle was advanced so it did not puncture dura and was located close the 6 O'Clock position of the pedical in AP tracterory. Biplanar projections were used to confirm position. Aspiration was confirmed to be negative for CSF and/or blood. A 1-2 ml. volume of Isovue -250 was injected and flow of contrast was noted at each level. Radiographs were obtained for  documentation purposes.   After attaining the desired flow of contrast documented above, a 0.5 to 1.0 ml test dose of 0.25% Marcaine  was injected into each respective transforaminal space.  The patient was observed for 90 seconds post injection.  After no sensory deficits were reported, and normal lower extremity motor function was noted,   the above injectate was administered so that equal amounts of the injectate were placed at each foramen (level) into the transforaminal epidural space.   Additional Comments:  The patient tolerated the procedure well Dressing: 2 x 2 sterile gauze and Band-Aid    Post-procedure details: Patient was observed during the procedure. Post-procedure instructions were reviewed.  Patient left the clinic in stable condition.

## 2024-04-14 ENCOUNTER — Other Ambulatory Visit: Payer: Self-pay | Admitting: Orthopedic Surgery

## 2024-04-16 ENCOUNTER — Other Ambulatory Visit: Payer: Self-pay | Admitting: *Deleted

## 2024-04-16 MED ORDER — RIZATRIPTAN BENZOATE 10 MG PO TABS: 10.0000 mg | ORAL_TABLET | ORAL | 1 refills | Status: AC | PRN

## 2024-04-16 NOTE — Telephone Encounter (Signed)
 Last seen on 10/31/23 Follow up scheduled on 11/03/24

## 2024-06-04 ENCOUNTER — Other Ambulatory Visit: Payer: Self-pay | Admitting: Orthopedic Surgery

## 2024-06-06 ENCOUNTER — Telehealth: Payer: Self-pay

## 2024-06-06 NOTE — Telephone Encounter (Signed)
 Copied from CRM [id number]. Topic: Appointments - Transfer of Care >> Jun 06, 2024  4:13 PM Hadassah PARAS wrote: Pt is requesting to transfer FROM:    Hughie Sharper, MD    Pt is requesting to transfer TO:  Panav Jha, MD Reason for requested transfer: Hilts no longer in office  It is the responsibility of the team the patient would like to transfer to (Dr.  Reyne Bustle, MD) to reach out to the patient if for any reason this transfer is not acceptable.

## 2024-06-15 ENCOUNTER — Other Ambulatory Visit: Payer: Self-pay | Admitting: Orthopedic Surgery

## 2024-06-16 ENCOUNTER — Telehealth: Payer: Self-pay | Admitting: Orthopedic Surgery

## 2024-06-16 ENCOUNTER — Encounter: Payer: Self-pay | Admitting: Radiology

## 2024-06-16 NOTE — Telephone Encounter (Signed)
 Patient called and said Dr. Eldonna thinks she should get an MRI. CB#[phone]

## 2024-06-20 ENCOUNTER — Other Ambulatory Visit: Payer: Self-pay | Admitting: Obstetrics and Gynecology

## 2024-06-20 DIAGNOSIS — Z1231 Encounter for screening mammogram for malignant neoplasm of breast: Secondary | ICD-10-CM

## 2024-06-27 ENCOUNTER — Ambulatory Visit
Admission: RE | Admit: 2024-06-27 | Discharge: 2024-06-27 | Disposition: A | Discharge status: Home / Self Care | Source: Ambulatory Visit | Attending: Obstetrics and Gynecology | Admitting: Obstetrics and Gynecology

## 2024-06-27 DIAGNOSIS — Z1231 Encounter for screening mammogram for malignant neoplasm of breast: Secondary | ICD-10-CM

## 2024-07-02 ENCOUNTER — Other Ambulatory Visit: Payer: Self-pay | Admitting: Obstetrics and Gynecology

## 2024-07-02 DIAGNOSIS — R928 Other abnormal and inconclusive findings on diagnostic imaging of breast: Secondary | ICD-10-CM

## 2024-07-24 ENCOUNTER — Ambulatory Visit: Payer: Self-pay | Admitting: Family Medicine

## 2024-07-24 ENCOUNTER — Encounter

## 2024-07-24 ENCOUNTER — Other Ambulatory Visit

## 2024-08-13 ENCOUNTER — Other Ambulatory Visit: Payer: Self-pay | Admitting: Orthopedic Surgery

## 2024-08-26 ENCOUNTER — Telehealth: Payer: Self-pay | Admitting: Physical Medicine and Rehabilitation

## 2024-08-26 NOTE — Telephone Encounter (Signed)
 Pt called requesting a back injection. Last injection 04/07/24. Pt number is [phone].

## 2024-08-27 ENCOUNTER — Other Ambulatory Visit: Payer: Self-pay | Admitting: Physical Medicine and Rehabilitation

## 2024-08-27 DIAGNOSIS — M5416 Radiculopathy, lumbar region: Secondary | ICD-10-CM

## 2024-08-28 ENCOUNTER — Other Ambulatory Visit: Payer: Self-pay

## 2024-08-28 ENCOUNTER — Ambulatory Visit: Admitting: Orthopedic Surgery

## 2024-08-28 DIAGNOSIS — M25531 Pain in right wrist: Secondary | ICD-10-CM | POA: Diagnosis not present

## 2024-08-28 NOTE — Progress Notes (Signed)
 Patient comes in today for right wrist pain. She feels it along the radial aspect of her distal forearm and at the level of the wrist. It is worse with motion. She has noticed swelling in the area as well. There as no trauma or injury that preceded the onset of this pain. She has tried using naproxen  and icing the area but that has not helped. On exam, she has TTP over the radial styloid and distal radial forearm. No other tenderness to palpation over the wrist and hand. +Finkelstein. No swelling seen. No skin discoloration or open wound seen. Normal wrist ROM. AIN/PIN/IO intact. SILT in m/u/r nerve distributions. Palpable radial pulse.   Patient's symptoms and physical exam seem consistent with DeQuervain's. She has tried over the counter medications and icing without relief. She has also tried bracing. I talked about injection as another non-operative treatment to try. She was interested in trying that so that was done today in the office - see procedure note below. I told her that if her symptoms get better with this than no further treatment is needed. However, if pain does not improve much or pain returns after a few weeks time, I will have her see our hand surgeon to be evaluated and see if she would be a good candidate for surgical management. She will let me know how things go.    Ozell DELENA Ada, MD Orthopedic Surgeon   Right wrist injection note: After discussing the risks, benefits, and alternatives of right 1st extensor compartment injection, patient elected to proceed. The skin overlying the radial styloid was prepped with an alcohol based prep. The skin was anesthetized with ethyl chloride. A 22 gauge needle was used to inject 1cc of depomedrol, 0.25cc of lidocaine , 0.25cc of bupivacaine  into the 1st extensor compartment under standard sterile technique. Needle was withdrawn and bandaid applied. Patient tolerated the procedure well.    I spent in the room with the patient going over  the history, performing the exam, covering treatment options, discussing the plan going forward, and performing the injection.

## 2024-09-08 ENCOUNTER — Other Ambulatory Visit

## 2024-09-08 ENCOUNTER — Encounter

## 2024-09-08 ENCOUNTER — Other Ambulatory Visit: Payer: Self-pay | Admitting: Orthopedic Surgery

## 2024-09-11 ENCOUNTER — Ambulatory Visit: Admitting: Physical Medicine and Rehabilitation

## 2024-09-11 ENCOUNTER — Other Ambulatory Visit: Payer: Self-pay

## 2024-09-11 ENCOUNTER — Telehealth: Payer: Self-pay | Admitting: Physical Medicine and Rehabilitation

## 2024-09-11 VITALS — BP 116/80 | HR 79

## 2024-09-11 DIAGNOSIS — M5416 Radiculopathy, lumbar region: Secondary | ICD-10-CM

## 2024-09-11 MED ORDER — METHYLPREDNISOLONE ACETATE 40 MG/ML IJ SUSP
40.0000 mg | Freq: Once | INTRAMUSCULAR | Status: AC
  Administered 2024-09-11: 40 mg

## 2024-09-11 NOTE — Procedures (Signed)
 Lumbosacral Transforaminal Epidural Steroid Injection - Sub-Pedicular Approach with Fluoroscopic Guidance  Patient: Lindsey Graham      Date of Birth: Jan 18, 1976 MRN: [id number] PCP: Hughie Sharper, MD      Visit Date: 09/11/2024   Universal Protocol:    Date/Time: 09/11/2024  Consent Given By: the patient  Position: PRONE  Additional Comments: Vital signs were monitored before and after the procedure. Patient was prepped and draped in the usual sterile fashion. The correct patient, procedure, and site was verified.   Injection Procedure Details:   Procedure diagnoses: Lumbar radiculopathy [M54.16]    Meds Administered:  Meds ordered this encounter  Medications   methylPREDNISolone  acetate (DEPO-MEDROL ) injection 40 mg    Laterality: Right  Location/Site: L5  Needle:5.0 in., 22 ga.  Short bevel or Quincke spinal needle  Needle Placement: Transforaminal  Findings:    -Comments: Excellent flow of contrast along the nerve, nerve root and into the epidural space.  Procedure Details: After squaring off the end-plates to get a true AP view, the C-arm was positioned so that an oblique view of the foramen as noted above was visualized. The target area is just inferior to the nose of the scotty dog or sub pedicular. The soft tissues overlying this structure were infiltrated with 2-3 ml. of 1% Lidocaine  without Epinephrine.  The spinal needle was inserted toward the target using a trajectory view along the fluoroscope beam.  Under AP and lateral visualization, the needle was advanced so it did not puncture dura and was located close the 6 O'Clock position of the pedical in AP tracterory. Biplanar projections were used to confirm position. Aspiration was confirmed to be negative for CSF and/or blood. A 1-2 ml. volume of Isovue -250 was injected and flow of contrast was noted at each level. Radiographs were obtained for documentation purposes.   After attaining the desired flow of  contrast documented above, a 0.5 to 1.0 ml test dose of 0.25% Marcaine  was injected into each respective transforaminal space.  The patient was observed for 90 seconds post injection.  After no sensory deficits were reported, and normal lower extremity motor function was noted,   the above injectate was administered so that equal amounts of the injectate were placed at each foramen (level) into the transforaminal epidural space.   Additional Comments:  The patient tolerated the procedure well Dressing: 2 x 2 sterile gauze and Band-Aid    Post-procedure details: Patient was observed during the procedure. Post-procedure instructions were reviewed.  Patient left the clinic in stable condition.

## 2024-09-11 NOTE — Progress Notes (Signed)
 Pain Scale   Average Pain 8 Patient advising she has chronic lower back pain that radiates bilaterally and pain is constant.        +Driver, -BT, -Dye Allergies.

## 2024-09-11 NOTE — Telephone Encounter (Signed)
 Pt called asking for sooner appt if possible. Please call pt about this matter at [phone].

## 2024-09-11 NOTE — Progress Notes (Signed)
 "  Lindsey Graham - 49 y.o. female MRN [id number]  Date of birth: Aug 19, 1975  Office Visit Note: Visit Date: 09/11/2024 PCP: Hughie Sharper, MD Referred by: Hughie Sharper, MD  Subjective: No chief complaint on file.  HPI:  Lindsey Graham is a 49 y.o. female who comes in today at the request of Duwaine Pouch, FNP and Sharper Ada, MD for planned Right L5-S1 Lumbar Transforaminal epidural steroid injection with fluoroscopic guidance.  The patient has failed conservative care including home exercise, medications, time and activity modification.  This injection will be diagnostic and hopefully therapeutic.  Please see requesting physician notes for further details and justification. Need to think about updated lumbar MRI.   ROS Otherwise per HPI.  Assessment & Plan: Visit Diagnoses:    ICD-10-CM   1. Lumbar radiculopathy  M54.16 XR C-ARM NO REPORT    Epidural Steroid injection    methylPREDNISolone  acetate (DEPO-MEDROL ) injection 40 mg      Plan: No additional findings.   Meds & Orders:  Meds ordered this encounter  Medications   methylPREDNISolone  acetate (DEPO-MEDROL ) injection 40 mg    Orders Placed This Encounter  Procedures   XR C-ARM NO REPORT   Epidural Steroid injection    Follow-up: Return for visit to requesting provider as needed.   Procedures: No procedures performed  Lumbosacral Transforaminal Epidural Steroid Injection - Sub-Pedicular Approach with Fluoroscopic Guidance  Patient: Lindsey Graham      Date of Birth: 1976/04/12 MRN: [id number] PCP: Hughie Sharper, MD      Visit Date: 09/11/2024   Universal Protocol:    Date/Time: 09/11/2024  Consent Given By: the patient  Position: PRONE  Additional Comments: Vital signs were monitored before and after the procedure. Patient was prepped and draped in the usual sterile fashion. The correct patient, procedure, and site was verified.   Injection Procedure Details:   Procedure diagnoses: Lumbar radiculopathy  [M54.16]    Meds Administered:  Meds ordered this encounter  Medications   methylPREDNISolone  acetate (DEPO-MEDROL ) injection 40 mg    Laterality: Right  Location/Site: L5  Needle:5.0 in., 22 ga.  Short bevel or Quincke spinal needle  Needle Placement: Transforaminal  Findings:    -Comments: Excellent flow of contrast along the nerve, nerve root and into the epidural space.  Procedure Details: After squaring off the end-plates to get a true AP view, the C-arm was positioned so that an oblique view of the foramen as noted above was visualized. The target area is just inferior to the nose of the scotty dog or sub pedicular. The soft tissues overlying this structure were infiltrated with 2-3 ml. of 1% Lidocaine  without Epinephrine.  The spinal needle was inserted toward the target using a trajectory view along the fluoroscope beam.  Under AP and lateral visualization, the needle was advanced so it did not puncture dura and was located close the 6 O'Clock position of the pedical in AP tracterory. Biplanar projections were used to confirm position. Aspiration was confirmed to be negative for CSF and/or blood. A 1-2 ml. volume of Isovue -250 was injected and flow of contrast was noted at each level. Radiographs were obtained for documentation purposes.   After attaining the desired flow of contrast documented above, a 0.5 to 1.0 ml test dose of 0.25% Marcaine  was injected into each respective transforaminal space.  The patient was observed for 90 seconds post injection.  After no sensory deficits were reported, and normal lower extremity motor function was noted,   the above injectate was administered  so that equal amounts of the injectate were placed at each foramen (level) into the transforaminal epidural space.   Additional Comments:  The patient tolerated the procedure well Dressing: 2 x 2 sterile gauze and Band-Aid    Post-procedure details: Patient was observed during the  procedure. Post-procedure instructions were reviewed.  Patient left the clinic in stable condition.    Clinical History: MRI LUMBAR SPINE WITHOUT CONTRAST   TECHNIQUE: Multiplanar, multisequence MR imaging of the lumbar spine was performed. No intravenous contrast was administered.   COMPARISON:  Lumbar MRI 10/27/2019   FINDINGS: Segmentation: S1 is a fully lumbarized vertebral body. There is a normal disc space at S1-2. Numbering consistent with the prior MRI report.   Alignment:  Mild retrolisthesis L3-4, L4-5, L5-S1.   Vertebrae:  Normal bone marrow.  Negative for fracture or mass.   Conus medullaris and cauda equina: Conus extends to the L1-2 level. Conus and cauda equina appear normal.   Paraspinal and other soft tissues: Negative for paraspinous mass or adenopathy.   Disc levels:   L1-2: Negative   L2-3: Negative   L3-4: Negative   L4-5: Mild disc bulging with small central disc protrusion unchanged from prior. Mild facet hypertrophy. Negative for spinal or foraminal stenosis.   L5-S1: Shallow broad-based disc protrusion. Central annular fissure with small central disc protrusion unchanged. Bilateral facet and ligamentum flavum hypertrophy. Mild spinal stenosis and moderate subarticular stenosis bilaterally. No interval change.   S1-2: Negative   IMPRESSION: S1 is a fully lumbarized vertebra.   Disc bulging and small central disc protrusion L4-5 unchanged   Shallow central disc protrusion L5-S1 with bilateral facet hypertrophy. Mild spinal stenosis and moderate subarticular stenosis bilaterally, without interval change.     Electronically Signed   By: Carlin Gaskins M.D.   On: 07/02/2020 15:01     Objective:  VS:  HT:    WT:   BMI:     BP:   HR: bpm  TEMP: ( )  RESP:  Physical Exam Vitals and nursing note reviewed.  Constitutional:      General: She is not in acute distress.    Appearance: Normal appearance. She is not ill-appearing.   HENT:     Head: Normocephalic and atraumatic.     Right Ear: External ear normal.     Left Ear: External ear normal.  Eyes:     Extraocular Movements: Extraocular movements intact.  Cardiovascular:     Rate and Rhythm: Normal rate.     Pulses: Normal pulses.  Pulmonary:     Effort: Pulmonary effort is normal. No respiratory distress.  Abdominal:     General: There is no distension.     Palpations: Abdomen is soft.  Musculoskeletal:        General: Tenderness present.     Cervical back: Neck supple.     Right lower leg: No edema.     Left lower leg: No edema.     Comments: Patient has good distal strength with no pain over the greater trochanters.  No clonus or focal weakness.  Skin:    Findings: No erythema, lesion or rash.  Neurological:     General: No focal deficit present.     Mental Status: She is alert and oriented to person, place, and time.     Sensory: No sensory deficit.     Motor: No weakness or abnormal muscle tone.     Coordination: Coordination normal.  Psychiatric:  Mood and Affect: Mood normal.        Behavior: Behavior normal.      Imaging: No results found. "

## 2024-09-12 ENCOUNTER — Ambulatory Visit
Admission: RE | Admit: 2024-09-12 | Discharge: 2024-09-12 | Disposition: A | Source: Ambulatory Visit | Attending: Obstetrics and Gynecology | Admitting: Obstetrics and Gynecology

## 2024-09-12 DIAGNOSIS — R928 Other abnormal and inconclusive findings on diagnostic imaging of breast: Secondary | ICD-10-CM

## 2024-09-14 ENCOUNTER — Other Ambulatory Visit: Payer: Self-pay | Admitting: Orthopedic Surgery

## 2024-09-15 ENCOUNTER — Encounter: Admitting: Physical Medicine and Rehabilitation

## 2024-10-20 ENCOUNTER — Ambulatory Visit: Admitting: Family Medicine

## 2024-11-03 ENCOUNTER — Ambulatory Visit: Admitting: Family Medicine
